# Patient Record
Sex: Male | Born: 1995 | Race: White | Hispanic: No | Marital: Single | Smoking: Never smoker
Health system: Southern US, Community
[De-identification: ages and names within clinical notes are randomized; demographics above are authoritative.]

## PROBLEM LIST (undated history)

## (undated) ENCOUNTER — Emergency Department: Admission: EM | Payer: Medicaid Other | Source: Home / Self Care

## (undated) DIAGNOSIS — F329 Major depressive disorder, single episode, unspecified: Secondary | ICD-10-CM

## (undated) DIAGNOSIS — F909 Attention-deficit hyperactivity disorder, unspecified type: Secondary | ICD-10-CM

## (undated) DIAGNOSIS — F32A Depression, unspecified: Secondary | ICD-10-CM

## (undated) DIAGNOSIS — F4481 Dissociative identity disorder: Secondary | ICD-10-CM

## (undated) DIAGNOSIS — F649 Gender identity disorder, unspecified: Secondary | ICD-10-CM

## (undated) DIAGNOSIS — F840 Autistic disorder: Secondary | ICD-10-CM

## (undated) DIAGNOSIS — F4310 Post-traumatic stress disorder, unspecified: Secondary | ICD-10-CM

## (undated) DIAGNOSIS — F419 Anxiety disorder, unspecified: Secondary | ICD-10-CM

## (undated) HISTORY — PX: FACIAL FRACTURE SURGERY: SHX1570

---
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
## 2016-01-12 ENCOUNTER — Emergency Department
Admission: EM | Admit: 2016-01-12 | Discharge: 2016-01-12 | Disposition: A | Discharge status: Home / Self Care | Payer: Medicaid Other | Attending: Emergency Medicine | Admitting: Emergency Medicine

## 2016-01-12 ENCOUNTER — Emergency Department: Payer: Medicaid Other

## 2016-01-12 ENCOUNTER — Encounter: Payer: Self-pay | Admitting: Emergency Medicine

## 2016-01-12 DIAGNOSIS — R0789 Other chest pain: Secondary | ICD-10-CM | POA: Diagnosis present

## 2016-01-12 DIAGNOSIS — M940 Chondrocostal junction syndrome [Tietze]: Secondary | ICD-10-CM

## 2016-01-12 MED ORDER — IBUPROFEN 800 MG PO TABS: 800.0000 mg | ORAL_TABLET | Freq: Three times a day (TID) | ORAL | Status: DC | PRN

## 2016-01-12 MED ORDER — BACLOFEN 10 MG PO TABS: 10.0000 mg | ORAL_TABLET | Freq: Three times a day (TID) | ORAL | Status: DC

## 2016-01-12 NOTE — ED Notes (Signed)
Describes pain across chest with movement for couple of days   denies any trauma   Fever or cough..staes pain is like a muscle type pain

## 2016-01-12 NOTE — Discharge Instructions (Signed)
Chest Wall Pain °Chest wall pain is pain in or around the bones and muscles of your chest. Sometimes, an injury causes this pain. Sometimes, the cause may not be known. This pain may take several weeks or longer to get better. °HOME CARE INSTRUCTIONS  °Pay attention to any changes in your symptoms. Take these actions to help with your pain:  °· Rest as told by your health care provider.   °· Avoid activities that cause pain. These include any activities that use your chest muscles or your abdominal and side muscles to lift heavy items.    °· If directed, apply ice to the painful area: °· Put ice in a plastic bag. °· Place a towel between your skin and the bag. °· Leave the ice on for 20 minutes, 2-3 times per day. °· Take over-the-counter and prescription medicines only as told by your health care provider. °· Do not use tobacco products, including cigarettes, chewing tobacco, and e-cigarettes. If you need help quitting, ask your health care provider. °· Keep all follow-up visits as told by your health care provider. This is important. °SEEK MEDICAL CARE IF: °· You have a fever. °· Your chest pain becomes worse. °· You have new symptoms. °SEEK IMMEDIATE MEDICAL CARE IF: °· You have nausea or vomiting. °· You feel sweaty or light-headed. °· You have a cough with phlegm (sputum) or you cough up blood. °· You develop shortness of breath. °  °This information is not intended to replace advice given to you by your health care provider. Make sure you discuss any questions you have with your health care provider. °  °Document Released: 08/09/2005 Document Revised: 04/30/2015 Document Reviewed: 11/04/2014 °Elsevier Interactive Patient Education ©2016 [employer] ° °Costochondritis °Costochondritis, sometimes called Tietze syndrome, is a swelling and irritation (inflammation) of the tissue (cartilage) that connects your ribs with your breastbone (sternum). It causes pain in the chest and rib area. Costochondritis usually  goes away on its own over time. It can take up to 6 weeks or longer to get better, especially if you are unable to limit your activities. °CAUSES  °Some cases of costochondritis have no known cause. Possible causes include: °· Injury (trauma). °· Exercise or activity such as lifting. °· Severe coughing. °SIGNS AND SYMPTOMS °· Pain and tenderness in the chest and rib area. °· Pain that gets worse when coughing or taking deep breaths. °· Pain that gets worse with specific movements. °DIAGNOSIS  °Your health care provider will do a physical exam and ask about your symptoms. Chest X-rays or other tests may be done to rule out other problems. °TREATMENT  °Costochondritis usually goes away on its own over time. Your health care provider may prescribe medicine to help relieve pain. °HOME CARE INSTRUCTIONS  °· Avoid exhausting physical activity. Try not to strain your ribs during normal activity. This would include any activities using chest, abdominal, and side muscles, especially if heavy weights are used. °· Apply ice to the affected area for the first 2 days after the pain begins. °¨ Put ice in a plastic bag. °¨ Place a towel between your skin and the bag. °¨ Leave the ice on for 20 minutes, 2-3 times a day. °· Only take over-the-counter or prescription medicines as directed by your health care provider. °SEEK MEDICAL CARE IF: °· You have redness or swelling at the rib joints. These are signs of infection. °· Your pain does not go away despite rest or medicine. °SEEK IMMEDIATE MEDICAL CARE IF:  °· Your pain   increases or you are very uncomfortable. °· You have shortness of breath or difficulty breathing. °· You cough up blood. °· You have worse chest pains, sweating, or vomiting. °· You have a fever or persistent symptoms for more than 2-3 days. °· You have a fever and your symptoms suddenly get worse. °MAKE SURE YOU:  °· Understand these instructions. °· Will watch your condition. °· Will get help right away if you are  not doing well or get worse. °  °This information is not intended to replace advice given to you by your health care provider. Make sure you discuss any questions you have with your health care provider. °  °Document Released: 05/19/2005 Document Revised: 05/30/2013 Document Reviewed: 03/13/2013 °Elsevier Interactive Patient Education ©2016 Elsevier Inc. ° °

## 2016-01-12 NOTE — ED Notes (Signed)
States he developed muscle type pain to mid chest   Non radiating   For the past 3-4 days

## 2016-01-12 NOTE — ED Notes (Signed)
Pt presents with chest discomfort describes as muscular in type, starting yesterday. Denies any other sx.

## 2016-01-12 NOTE — ED Provider Notes (Signed)
[facility] Emergency Department Provider Note  ____________________________________________  Time seen: Approximately 2:55 PM  I have reviewed the triage vital signs and the nursing notes.   HISTORY  Chief Complaint Muscle Pain    HPI George [name] is a 20 y.o. male presents for evaluation of nonspecific chest wall pain. States it's been going on for last 3-4 days worse yesterday and today. States that he feels like it's secondary to his weight but pain is worse with movement. Denies any shortness of breath. Denies any nausea vomiting or sweating.   History reviewed. No pertinent past medical history.  There are no active problems to display for this patient.   History reviewed. No pertinent past surgical history.  Current Outpatient Rx  Name  Route  Sig  Dispense  Refill  . baclofen (LIORESAL) 10 MG tablet   Oral   Take 1 tablet (10 mg total) by mouth 3 (three) times daily.   30 tablet   0   . ibuprofen (ADVIL,MOTRIN) 800 MG tablet   Oral   Take 1 tablet (800 mg total) by mouth every 8 (eight) hours as needed.   30 tablet   0     Allergies Lithium and Tegretol  No family history on file.  Social History Social History  Substance Use Topics  . Smoking status: Never Smoker   . Smokeless tobacco: None  . Alcohol Use: No    Review of Systems Constitutional: No fever/chills Eyes: No visual changes. ENT: No sore throat. Cardiovascular: Positive for chest wall pain. Respiratory: Denies shortness of breath. Gastrointestinal: No abdominal pain.  No nausea, no vomiting.  No diarrhea.  No constipation. Genitourinary: Negative for dysuria. Musculoskeletal: Negative for back pain. Skin: Negative for rash. Neurological: Negative for headaches, focal weakness or numbness.  10-point ROS otherwise negative.  ____________________________________________   PHYSICAL EXAM:  VITAL SIGNS: ED Triage Vitals  Enc Vitals Group     BP  01/12/16 1342 107/74 mmHg     Pulse Rate 01/12/16 1342 72     Resp 01/12/16 1342 20     Temp 01/12/16 1342 98.2 F (36.8 C)     Temp Source 01/12/16 1342 Oral     SpO2 01/12/16 1342 98 %     Weight 01/12/16 1342 112 lb (50.803 kg)     Height 01/12/16 1342  (1.702 m)     Head Cir --      Peak Flow --      Pain Score 01/12/16 1337 5     Pain Loc --      Pain Edu? --      Excl. in GC? --     Constitutional: Alert and oriented. Well appearing and in no acute distress. Head: Atraumatic. Neck: No stridor.Full range of motion nontender   Cardiovascular: Normal rate, regular rhythm. Grossly normal heart sounds.  Good peripheral circulation. Point tenderness noted to the sternal area. Respiratory: Normal respiratory effort.  No retractions. Lungs CTAB. Musculoskeletal: No lower extremity tenderness nor edema.  No joint effusions. Neurologic:  Normal speech and language. No gross focal neurologic deficits are appreciated. No gait instability. Skin:  Skin is warm, dry and intact. No rash noted. Psychiatric: Mood and affect are normal. Speech and behavior are normal.  ____________________________________________   LABS (all labs ordered are listed, but only abnormal results are displayed)  Labs Reviewed - No data to display ____________________________________________  EKG  Normal sinus rhythm. No acute STEMI. ____________________________________________  RADIOLOGY  No acute cardiopulmonary  findings. ____________________________________________   PROCEDURES  Procedure(s) performed: None  Critical Care performed: No  ____________________________________________   INITIAL IMPRESSION / ASSESSMENT AND PLAN / ED COURSE  Pertinent labs & imaging results that were available during my care of the patient were reviewed by me and considered in my medical decision making (see chart for details).  Nonspecific chest wall pain. Rx given for Motrin 800 mg 3 times a day and  baclofen 10 mg 3 times a day. She'll follow up with PCP or return to ER with any worsening symptomology. ____________________________________________   FINAL CLINICAL IMPRESSION(S) / ED DIAGNOSES  Final diagnoses:  Costochondritis, acute     This chart was dictated using voice recognition software/Dragon. Despite best efforts to proofread, errors can occur which can change the meaning. Any change was purely unintentional.   Evangeline Dakinharles M Keylani Perlstein, PA-C 01/12/16 1529  Governor Rooksebecca Lord, MD 01/12/16 1606

## 2016-06-16 ENCOUNTER — Emergency Department
Admission: EM | Admit: 2016-06-16 | Discharge: 2016-06-16 | Disposition: A | Discharge status: Home / Self Care | Payer: Medicaid Other | Attending: Emergency Medicine | Admitting: Emergency Medicine

## 2016-06-16 ENCOUNTER — Encounter: Payer: Self-pay | Admitting: Emergency Medicine

## 2016-06-16 DIAGNOSIS — J309 Allergic rhinitis, unspecified: Secondary | ICD-10-CM | POA: Diagnosis not present

## 2016-06-16 DIAGNOSIS — H04301 Unspecified dacryocystitis of right lacrimal passage: Secondary | ICD-10-CM | POA: Diagnosis not present

## 2016-06-16 DIAGNOSIS — H1011 Acute atopic conjunctivitis, right eye: Secondary | ICD-10-CM | POA: Diagnosis not present

## 2016-06-16 DIAGNOSIS — Z791 Long term (current) use of non-steroidal anti-inflammatories (NSAID): Secondary | ICD-10-CM | POA: Diagnosis not present

## 2016-06-16 DIAGNOSIS — H5711 Ocular pain, right eye: Secondary | ICD-10-CM | POA: Diagnosis present

## 2016-06-16 MED ORDER — POLYMYXIN B-TRIMETHOPRIM 10000-0.1 UNIT/ML-% OP SOLN: 2.0000 [drp] | Freq: Four times a day (QID) | OPHTHALMIC | 0 refills | Status: DC

## 2016-06-16 MED ORDER — CETIRIZINE HCL 10 MG PO TABS: 10.0000 mg | ORAL_TABLET | Freq: Every day | ORAL | 0 refills | Status: DC

## 2016-06-16 MED ORDER — CROMOLYN SODIUM 4 % OP SOLN: 2.0000 [drp] | Freq: Four times a day (QID) | OPHTHALMIC | 12 refills | Status: DC

## 2016-06-16 MED ORDER — FLUTICASONE PROPIONATE 50 MCG/ACT NA SUSP: 1.0000 | Freq: Two times a day (BID) | NASAL | 0 refills | Status: DC

## 2016-06-16 NOTE — ED Provider Notes (Signed)
[facility] Emergency Department Provider Note  ____________________________________________  Time seen: Approximately 8:15 PM  I have reviewed the triage vital signs and the nursing notes.   HISTORY  Chief Complaint Eye Drainage    HPI George [name] is a 20 y.o. male who presents emergency department complaining of right eye pain/drainage and redness. Patient was seen by primary care and diagnosed most with eye irritation. He is advised to use warm compresses but was not given any medications. Patient states that he has continued to have symptoms for total of 4 weeks at this point. He denies any visual changes. He does not wear glasses or contacts. No trauma to the eye preceding these symptoms. Patient states that he has pain to the corner of the eye over the tear duct. He states that there is a watery as well as mildly purulent drainage that occurs throughout the day. He denies any conjunctival redness that is chronic but states that occasionally his eyes will become red. It is contained to the right eye only. He does have allergic rhinitis type symptoms. No other complaints at this time.   History reviewed. No pertinent past medical history.  There are no active problems to display for this patient.   History reviewed. No pertinent surgical history.  Prior to Admission medications   Medication Sig Start Date End Date Taking? Authorizing Provider  baclofen (LIORESAL) 10 MG tablet Take 1 tablet (10 mg total) by mouth 3 (three) times daily. 01/12/16   Evangeline Dakin, PA-C  cetirizine (ZYRTEC) 10 MG tablet Take 1 tablet (10 mg total) by mouth daily. 06/16/16   Delorise Royals Cuthriell, PA-C  cromolyn (OPTICROM) 4 % ophthalmic solution Place 2 drops into the right eye 4 (four) times daily. 06/16/16   Delorise Royals Cuthriell, PA-C  fluticasone (FLONASE) 50 MCG/ACT nasal spray Place 1 spray into both nostrils 2 (two) times daily. 06/16/16   Delorise Royals Cuthriell, PA-C   ibuprofen (ADVIL,MOTRIN) 800 MG tablet Take 1 tablet (800 mg total) by mouth every 8 (eight) hours as needed. 01/12/16   Evangeline Dakin, PA-C  trimethoprim-polymyxin b (POLYTRIM) ophthalmic solution Place 2 drops into the left eye every 6 (six) hours. 06/16/16   Delorise Royals Cuthriell, PA-C    Allergies Lithium and Tegretol [carbamazepine]  No family history on file.  Social History Social History  Substance Use Topics  . Smoking status: Never Smoker  . Smokeless tobacco: Never Used  . Alcohol use No     Review of Systems  Constitutional: No fever/chills Eyes: No visual changes. Positive for both watery and purulent discharge. Mild eye redness. Mild pain. ENT: No upper respiratory complaints. Cardiovascular: no chest pain. Respiratory: no cough. No SOB. Musculoskeletal: Negative for musculoskeletal pain. Skin: Negative for rash, abrasions, lacerations, ecchymosis. Neurological: Negative for headaches, focal weakness or numbness. 10-point ROS otherwise negative.  ____________________________________________   PHYSICAL EXAM:  VITAL SIGNS: ED Triage Vitals  Enc Vitals Group     BP 06/16/16 1948 110/73     Pulse Rate 06/16/16 1948 85     Resp 06/16/16 1948 18     Temp 06/16/16 1948 98.1 F (36.7 C)     Temp Source 06/16/16 1948 Oral     SpO2 06/16/16 1948 100 %     Weight 06/16/16 1948 100 lb (45.4 kg)     Height 06/16/16 1948 5\' 7"  (1.702 m)     Head Circumference --      Peak Flow --  Pain Score 06/16/16 1949 5     Pain Loc --      Pain Edu? --      Excl. in GC? --      Constitutional: Alert and oriented. Well appearing and in no acute distress. Eyes: Conjunctivae Is slightly erythematous on right. Patient does have some mild erythema over the lacrimal gland. Area is very mildly tender to palpation. With palpation, mild purulent drainage is appreciated. No lacrimal stones are appreciated. Funduscopic exam is unremarkable bilaterally. PERRL. EOMI. Head:  Atraumatic. ENT:      Ears:       Nose: No congestion/rhinnorhea. Turbinates are mildly boggy in appearance.      Mouth/Throat: Mucous membranes are moist.  Neck: No stridor.    Cardiovascular: Normal rate, regular rhythm. Normal S1 and S2.  Good peripheral circulation. Respiratory: Normal respiratory effort without tachypnea or retractions. Lungs CTAB. Good air entry to the bases with no decreased or absent breath sounds. Musculoskeletal: Full range of motion to all extremities. No gross deformities appreciated. Neurologic:  Normal speech and language. No gross focal neurologic deficits are appreciated.  Skin:  Skin is warm, dry and intact. No rash noted. Psychiatric: Mood and affect are normal. Speech and behavior are normal. Patient exhibits appropriate insight and judgement.   ____________________________________________   LABS (all labs ordered are listed, but only abnormal results are displayed)  Labs Reviewed - No data to display ____________________________________________  EKG   ____________________________________________  RADIOLOGY   No results found.  ____________________________________________    PROCEDURES  Procedure(s) performed:    Procedures    Medications - No data to display   ____________________________________________   INITIAL IMPRESSION / ASSESSMENT AND PLAN / ED COURSE  Pertinent labs & imaging results that were available during my care of the patient were reviewed by me and considered in my medical decision making (see chart for details).  Review of the  [place]RS was performed in accordance of the [facility] prior to dispensing any controlled drugs.  Clinical Course    Patient's diagnosis is consistent with Mild dacryocystitis versus allergic conjunctivitis. Patient also has allergic rhinitis type symptoms. Because of the ongoing length and physical exam findings of mild tenderness to palpation over the lacrimal gland, patient will be  placed on antibiotic eyedrops as well as cromolyn eyedrops for symptom control. If symptoms persist patient will follow-up with ophthalmology. No acute indications of severe bacterial infection requiring ophthalmology consult at this time. Patient is also given medications for allergic rhinitis type symptoms..  Patient is given ED precautions to return to the ED for any worsening or new symptoms.     ____________________________________________  FINAL CLINICAL IMPRESSION(S) / ED DIAGNOSES  Final diagnoses:  Dacrocystitis, right  Allergic conjunctivitis of right eye  Allergic rhinitis, unspecified chronicity, unspecified seasonality, unspecified trigger      NEW MEDICATIONS STARTED DURING THIS VISIT:  New Prescriptions   CETIRIZINE (ZYRTEC) 10 MG TABLET    Take 1 tablet (10 mg total) by mouth daily.   CROMOLYN (OPTICROM) 4 % OPHTHALMIC SOLUTION    Place 2 drops into the right eye 4 (four) times daily.   FLUTICASONE (FLONASE) 50 MCG/ACT NASAL SPRAY    Place 1 spray into both nostrils 2 (two) times daily.   TRIMETHOPRIM-POLYMYXIN B (POLYTRIM) OPHTHALMIC SOLUTION    Place 2 drops into the left eye every 6 (six) hours.        This chart was dictated using voice recognition software/Dragon. Despite best efforts to proofread, errors can  occur which can change the meaning. Any change was purely unintentional.    Racheal PatchesJonathan D Cuthriell, PA-C 06/16/16 2032    Rockne MenghiniAnne-Caroline Norman, MD 06/16/16 2300

## 2016-06-16 NOTE — ED Triage Notes (Signed)
Pt presents to ED with c/o right eye drainage x 4 weeks, pt reports went 2 weeks ago to Urgent Care and was told to place warm water to eye to help with drainage. Pt states eye drainage has been getting worse.

## 2017-06-21 IMAGING — CR DG CHEST 2V
1 series · 2 of 2 positions shown · non-contrast
Comparison: 08/02/2013

CLINICAL DATA: Mid chest pain for 3 days

EXAM:
CHEST  2 VIEW

[Series 1: dg chest 2 view · 0.14mm/px · 2 of 2 slices shown]
[im 1/2]
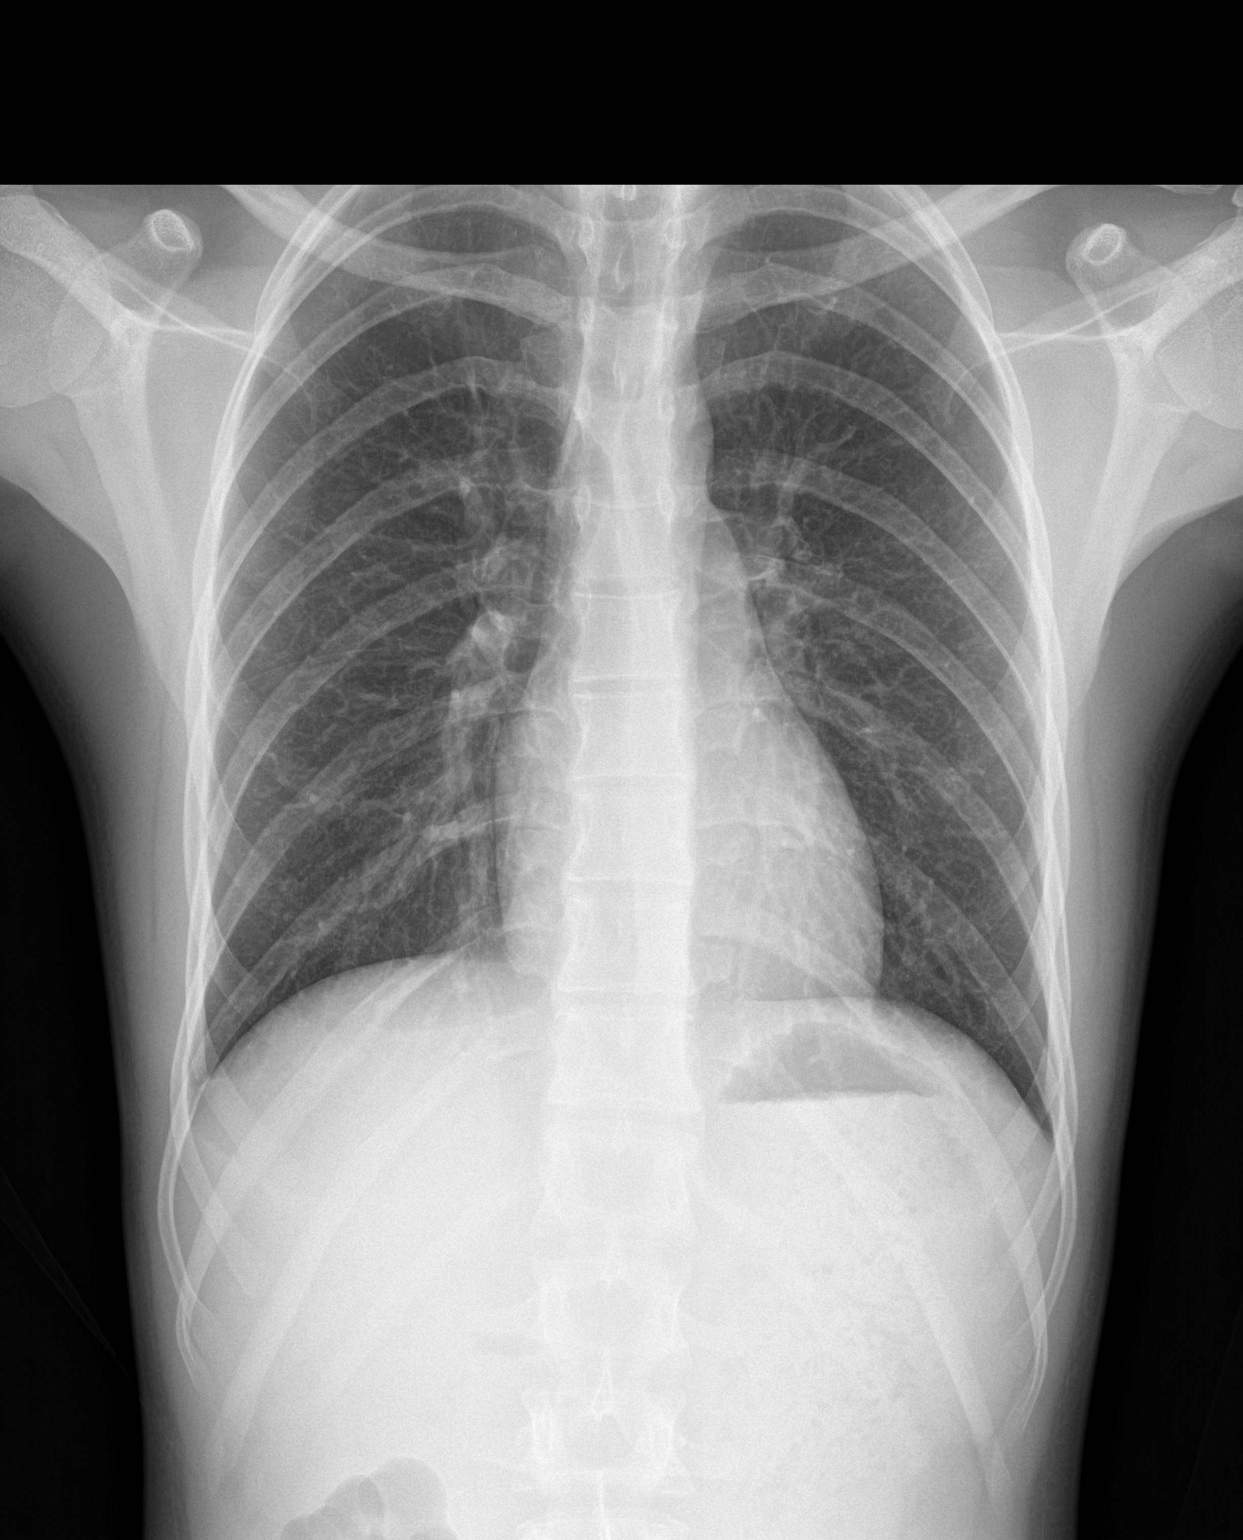
[im 2/2]
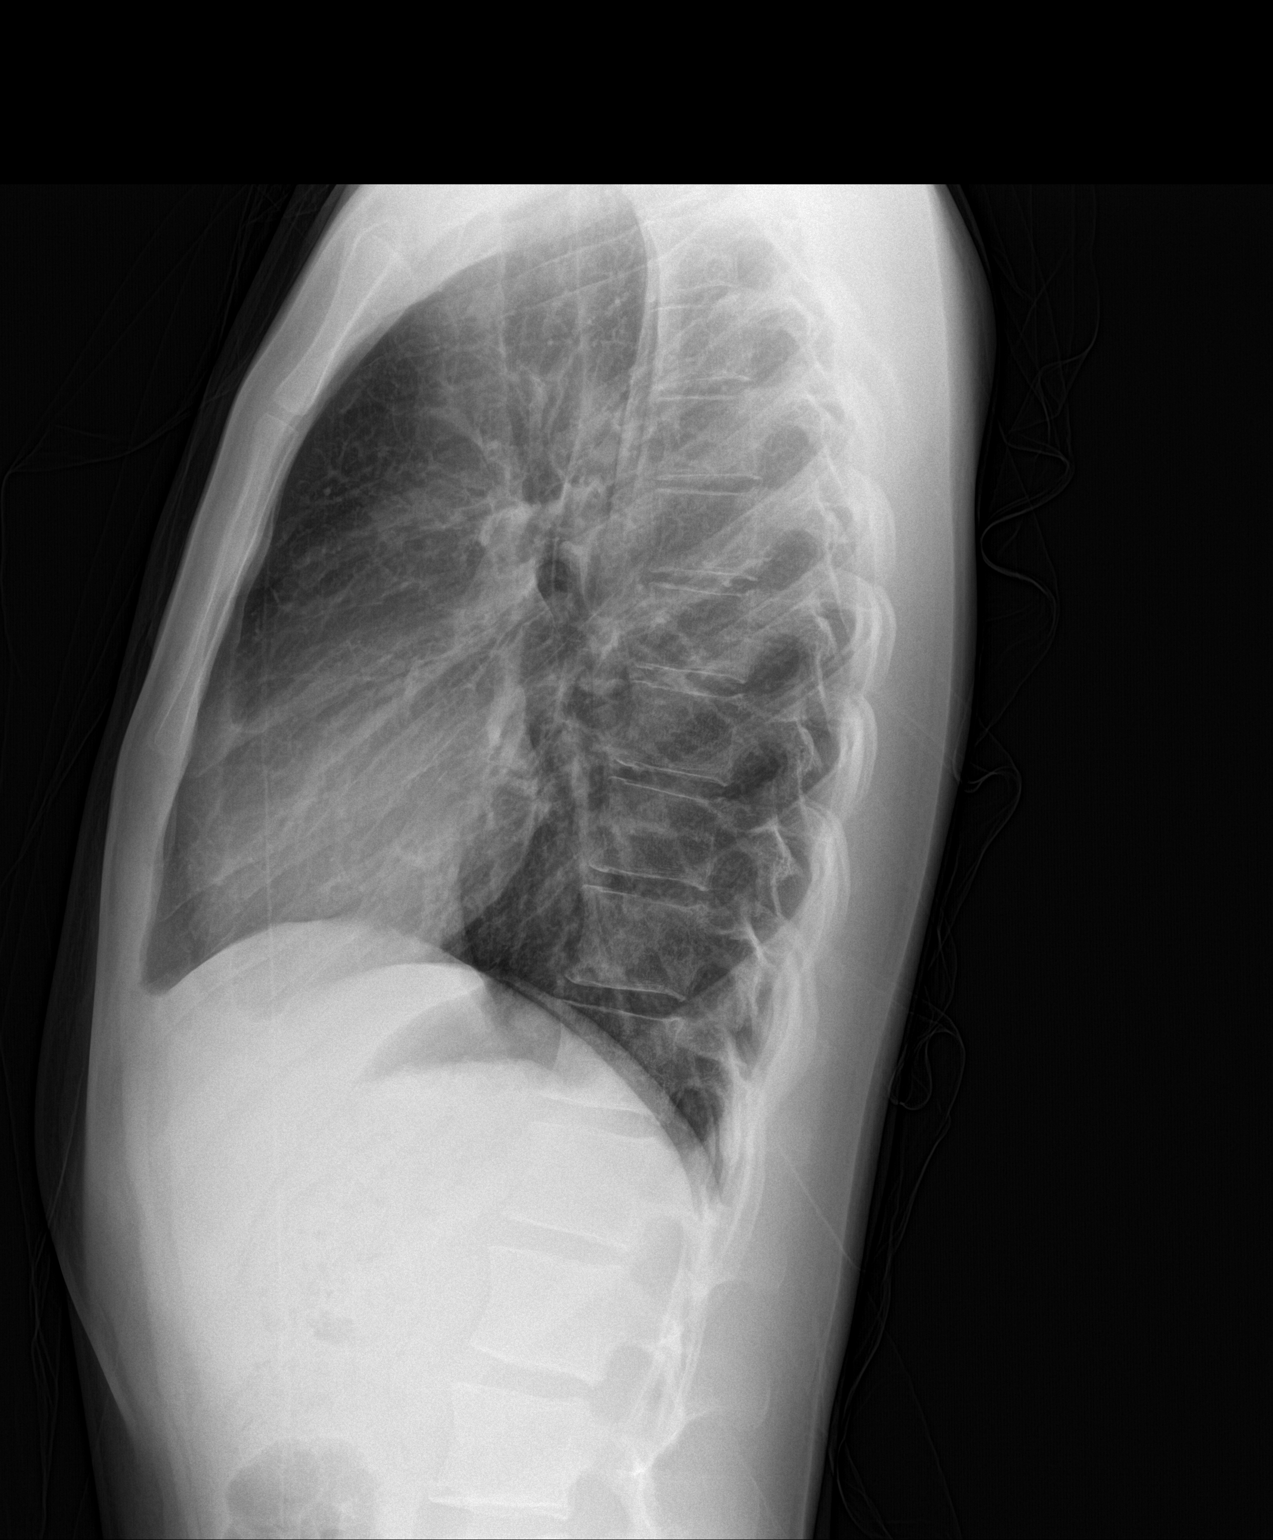

[2 of 2 positions shown; findings below may reference images not displayed]

FINDINGS: The heart size and mediastinal contours are within normal limits.
Both lungs are clear. The visualized skeletal structures are
unremarkable.
IMPRESSION: No active cardiopulmonary disease.

## 2018-01-09 ENCOUNTER — Ambulatory Visit: Payer: Self-pay | Admitting: Pharmacy Technician

## 2018-01-09 ENCOUNTER — Encounter (INDEPENDENT_AMBULATORY_CARE_PROVIDER_SITE_OTHER): Payer: Self-pay

## 2018-01-09 DIAGNOSIS — Z79899 Other long term (current) drug therapy: Secondary | ICD-10-CM

## 2018-01-09 NOTE — Progress Notes (Signed)
  Completed [facility] application and contract.  Patient agreed to all terms of the [facility] contract.    Patient approved to receive medication assistance at [facility] through 2019, as long as eligibility criteria continues to be met.    Provided patient with Civil engineer, contracting based on his particular needs.    Referred patient to [facility].  Refused. Patient stated that he is seeing Kalman Drape at [facility].   Referred patient to [place] [facility] [employer].  Patient stated that he cannot be seen at [facility] because of a conflict of interest.    [facility]

## 2018-01-10 ENCOUNTER — Encounter: Payer: Self-pay | Admitting: Pharmacy Technician

## 2018-05-31 ENCOUNTER — Emergency Department: Payer: Medicaid Other

## 2018-05-31 DIAGNOSIS — Y9389 Activity, other specified: Secondary | ICD-10-CM | POA: Insufficient documentation

## 2018-05-31 DIAGNOSIS — Y92410 Unspecified street and highway as the place of occurrence of the external cause: Secondary | ICD-10-CM | POA: Insufficient documentation

## 2018-05-31 DIAGNOSIS — S9032XA Contusion of left foot, initial encounter: Secondary | ICD-10-CM | POA: Insufficient documentation

## 2018-05-31 DIAGNOSIS — Y999 Unspecified external cause status: Secondary | ICD-10-CM | POA: Insufficient documentation

## 2018-05-31 DIAGNOSIS — Z79899 Other long term (current) drug therapy: Secondary | ICD-10-CM | POA: Insufficient documentation
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
## 2018-06-01 ENCOUNTER — Emergency Department
Admission: EM | Admit: 2018-06-01 | Discharge: 2018-06-01 | Disposition: A | Discharge status: Home / Self Care | Payer: Medicaid Other | Attending: Emergency Medicine | Admitting: Emergency Medicine

## 2018-06-01 DIAGNOSIS — S9032XA Contusion of left foot, initial encounter: Secondary | ICD-10-CM

## 2018-06-01 HISTORY — DX: Anxiety disorder, unspecified: F41.9

## 2018-06-01 HISTORY — DX: Depression, unspecified: F32.A

## 2018-06-01 HISTORY — DX: Major depressive disorder, single episode, unspecified: F32.9

## 2018-06-01 HISTORY — DX: Attention-deficit hyperactivity disorder, unspecified type: F90.9

## 2018-06-01 MED ORDER — KETOROLAC TROMETHAMINE 10 MG PO TABS
10.0000 mg | ORAL_TABLET | Freq: Once | ORAL | Status: AC
  Administered 2018-06-01: 10 mg via ORAL
  Filled 2018-06-01: qty 1
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
## 2018-06-01 NOTE — ED Notes (Signed)
Pt voices good understanding and demonstrates appropriate use of crutches

## 2018-11-15 ENCOUNTER — Other Ambulatory Visit: Payer: Self-pay

## 2018-11-15 ENCOUNTER — Emergency Department
Admission: EM | Admit: 2018-11-15 | Discharge: 2018-11-15 | Disposition: A | Discharge status: Home / Self Care | Payer: Medicaid Other | Attending: Emergency Medicine | Admitting: Emergency Medicine

## 2018-11-15 DIAGNOSIS — R55 Syncope and collapse: Secondary | ICD-10-CM | POA: Insufficient documentation

## 2018-11-15 DIAGNOSIS — Z79899 Other long term (current) drug therapy: Secondary | ICD-10-CM | POA: Insufficient documentation

## 2018-11-15 LAB — CBC WITH DIFFERENTIAL/PLATELET
Abs Immature Granulocytes: 0.07 10*3/uL (ref 0.00–0.07)
Basophils Absolute: 0 10*3/uL (ref 0.0–0.1)
Basophils Relative: 0 %
EOS ABS: 0 10*3/uL (ref 0.0–0.5)
EOS PCT: 0 %
HEMATOCRIT: 40 % (ref 39.0–52.0)
HEMOGLOBIN: 14.5 g/dL (ref 13.0–17.0)
Immature Granulocytes: 0 %
Lymphocytes Relative: 8 %
Lymphs Abs: 1.4 10*3/uL (ref 0.7–4.0)
MCH: 30.9 pg (ref 26.0–34.0)
MCHC: 36.3 g/dL — AB (ref 30.0–36.0)
MCV: 85.1 fL (ref 80.0–100.0)
MONO ABS: 0.9 10*3/uL (ref 0.1–1.0)
Monocytes Relative: 5 %
Neutro Abs: 16.1 10*3/uL — ABNORMAL HIGH (ref 1.7–7.7)
Neutrophils Relative %: 87 %
Platelets: 298 10*3/uL (ref 150–400)
RBC: 4.7 MIL/uL (ref 4.22–5.81)
RDW: 12 % (ref 11.5–15.5)
WBC: 18.5 10*3/uL — AB (ref 4.0–10.5)
nRBC: 0 % (ref 0.0–0.2)

## 2018-11-15 LAB — GLUCOSE, CAPILLARY: GLUCOSE-CAPILLARY: 130 mg/dL — AB (ref 70–99)

## 2018-11-15 LAB — BASIC METABOLIC PANEL
Anion gap: 11 (ref 5–15)
BUN: 16 mg/dL (ref 6–20)
CALCIUM: 9.8 mg/dL (ref 8.9–10.3)
CHLORIDE: 96 mmol/L — AB (ref 98–111)
CO2: 27 mmol/L (ref 22–32)
CREATININE: 0.67 mg/dL (ref 0.61–1.24)
GFR calc non Af Amer: >60 mL/min (ref >60)
Glucose, Bld: 154 mg/dL — ABNORMAL HIGH (ref 70–99)
Potassium: 4.3 mmol/L (ref 3.5–5.1)
SODIUM: 134 mmol/L — AB (ref 135–145)

## 2018-11-15 MED ORDER — SODIUM CHLORIDE 0.9 % IV BOLUS
1000.0000 mL | Freq: Once | INTRAVENOUS | Status: AC
  Administered 2018-11-15: 1000 mL via INTRAVENOUS

## 2018-11-15 NOTE — ED Notes (Signed)
Pt assisted to toilet 

## 2018-11-15 NOTE — ED Notes (Signed)
Pt stated they did not feel dizzy when they got up to use the bathroom- Dr Marisa Severin notified

## 2018-11-15 NOTE — ED Notes (Signed)
Pt was giving blood- states when needle was removed and saw a drop of blood and "passed out"- pt aroused easily with ammonia inhalant- at this time states that he is thirsty and hungry- per Dr Mayford Knife pt given peanut butter, crackers, and coke- no labs at this time

## 2018-11-15 NOTE — ED Triage Notes (Signed)
Pt gave blood and passed out- states this has never happened before after giving blood

## 2018-11-15 NOTE — ED Provider Notes (Signed)
[facility] Emergency Department Provider Note ____________________________________________   First MD Initiated Contact with Patient 11/15/18 1653     (approximate)  I have reviewed the triage vital signs and the nursing notes.   HISTORY  Chief Complaint Loss of Consciousness    HPI George [name] is a 23 y.o. adult male to male transgender patient who presents with syncope after donating blood, preceded by lightheadedness, and now resolved.  The patient states that after giving blood they began to feel lightheaded and nauseous.  They ate some crackers, but then continued to feel lightheaded and passed out.  They deny chest pain, shortness of breath, or any fever or recent illness.  They are feeling somewhat lightheaded and nauseated now but otherwise relatively well.  The patient states that they gave blood in the past without this happening.  Past Medical History:  Diagnosis Date  . ADHD   . Anxiety   . Depression     There are no active problems to display for this patient.   Past Surgical History:  Procedure Laterality Date  . FACIAL FRACTURE SURGERY      Prior to Admission medications   Medication Sig Start Date End Date Taking? Authorizing Provider  baclofen (LIORESAL) 10 MG tablet Take 1 tablet (10 mg total) by mouth 3 (three) times daily. 01/12/16   Beers, Charmayne Sheer, PA-C  cetirizine (ZYRTEC) 10 MG tablet Take 1 tablet (10 mg total) by mouth daily. 06/16/16   Cuthriell, Delorise Royals, PA-C  cromolyn (OPTICROM) 4 % ophthalmic solution Place 2 drops into the right eye 4 (four) times daily. 06/16/16   Cuthriell, Delorise Royals, PA-C  fluticasone (FLONASE) 50 MCG/ACT nasal spray Place 1 spray into both nostrils 2 (two) times daily. 06/16/16   Cuthriell, Delorise Royals, PA-C  ibuprofen (ADVIL,MOTRIN) 800 MG tablet Take 1 tablet (800 mg total) by mouth every 8 (eight) hours as needed. 01/12/16   Beers, Charmayne Sheer, PA-C  trimethoprim-polymyxin b (POLYTRIM)  ophthalmic solution Place 2 drops into the left eye every 6 (six) hours. 06/16/16   Cuthriell, Delorise Royals, PA-C    Allergies Lithium and Tegretol [carbamazepine]  History reviewed. No pertinent family history.  Social History Social History   Tobacco Use  . Smoking status: Never Smoker  . Smokeless tobacco: Never Used  Substance Use Topics  . Alcohol use: No  . Drug use: Not on file    Review of Systems  Constitutional: No fever. Eyes: No redness. ENT: No sore throat. Cardiovascular: Denies chest pain. Respiratory: Denies shortness of breath. Gastrointestinal: Positive for nausea Genitourinary: Negative for flank pain. Musculoskeletal: Negative for back pain. Skin: Negative for rash. Neurological: Negative for headache.   ____________________________________________   PHYSICAL EXAM:  VITAL SIGNS: ED Triage Vitals  Enc Vitals Group     BP 11/15/18 1446 (!) 87/74     Pulse Rate 11/15/18 1446 (!) 113     Resp 11/15/18 1452 17     Temp 11/15/18 1446 98.1 F (36.7 C)     Temp Source 11/15/18 1446 Oral     SpO2 11/15/18 1446 100 %     Weight 11/15/18 1447 110 lb (49.9 kg)     Height 11/15/18 1447 5\' 8"  (1.727 m)     Head Circumference --      Peak Flow --      Pain Score 11/15/18 1447 0     Pain Loc --      Pain Edu? --      Excl.  in GC? --     Constitutional: Alert and oriented.  Relatively well appearing and in no acute distress. Eyes: Conjunctivae are normal.  EOMI.  PERRLA. Head: Atraumatic. Nose: No congestion/rhinnorhea. Mouth/Throat: Mucous membranes are slightly dry.   Neck: Normal range of motion.  Cardiovascular: Normal rate, regular rhythm. Good peripheral circulation. Respiratory: Normal respiratory effort.  No retractions. Gastrointestinal: Soft and nontender. No distention.  Genitourinary: No flank tenderness. Musculoskeletal: Extremities warm and well perfused.  Neurologic:  Normal speech and language. No gross focal neurologic deficits  are appreciated.  Skin:  Skin is warm and dry. No rash noted. Psychiatric: Mood and affect are normal. Speech and behavior are normal.  ____________________________________________   LABS (all labs ordered are listed, but only abnormal results are displayed)  Labs Reviewed  GLUCOSE, CAPILLARY - Abnormal; Notable for the following components:      Result Value   Glucose-Capillary 130 (*)    All other components within normal limits  BASIC METABOLIC PANEL - Abnormal; Notable for the following components:   Sodium 134 (*)    Chloride 96 (*)    Glucose, Bld 154 (*)    All other components within normal limits  CBC WITH DIFFERENTIAL/PLATELET - Abnormal; Notable for the following components:   WBC 18.5 (*)    MCHC 36.3 (*)    Neutro Abs 16.1 (*)    All other components within normal limits  CBG MONITORING, ED   ____________________________________________  EKG  ED ECG REPORT I, Dionne Bucy, the attending physician, personally viewed and interpreted this ECG.  Date: 11/15/2018 EKG Time: 14:48 Rate: 120 Rhythm: Sinus tachycardia QRS Axis: normal Intervals: normal ST/T Wave abnormalities: Benign early repolarization Narrative Interpretation: no evidence of acute ischemia  ____________________________________________  RADIOLOGY    ____________________________________________   PROCEDURES  Procedure(s) performed: No  Procedures  Critical Care performed: No ____________________________________________   INITIAL IMPRESSION / ASSESSMENT AND PLAN / ED COURSE  Pertinent labs & imaging results that were available during my care of the patient were reviewed by me and considered in my medical decision making (see chart for details).  23 year old male to male transgender patient currently on hormonal therapy presents with syncope directly after donating blood, preceded by a prodrome of lightheadedness and nausea.  On exam, the patient is well-appearing and the  vital signs are normal except for borderline tachycardia.  The remainder of the exam is unremarkable.  EKG shows no significant abnormalities.  Overall I suspect most likely vasovagal syncope related to needle phobia, versus hypovolemia related to the blood donation.  Given the context of the episode and the presence of a prodrome, there is no evidence of cardiac etiology.  We will give fluids, obtain basic labs, and reassess.  ----------------------------------------- 7:02 PM on 11/15/2018 -----------------------------------------  Lab workup is unremarkable, except for elevated WBC count which could be related to the patient's hormone therapy or acute stress.  The patient is feeling better after fluids.  They are stable for discharge home. Return precautions given and the patient expresses understanding.   ____________________________________________   FINAL CLINICAL IMPRESSION(S) / ED DIAGNOSES  Final diagnoses:  Syncope, unspecified syncope type      NEW MEDICATIONS STARTED DURING THIS VISIT:  New Prescriptions   No medications on file     Note:  This document was prepared using Dragon voice recognition software and may include unintentional dictation errors.   Dionne Bucy, MD 11/15/18 1904

## 2018-11-15 NOTE — Discharge Instructions (Addendum)
Return to the ER for new, worsening or persistent lightheadedness, weakness, nausea, abdominal pain or any other new or worsening symptoms that concern you.

## 2019-01-28 ENCOUNTER — Encounter: Payer: Self-pay | Admitting: Intensive Care

## 2019-01-28 ENCOUNTER — Emergency Department
Admission: EM | Admit: 2019-01-28 | Discharge: 2019-01-30 | Disposition: A | Discharge status: Home / Self Care | Payer: Medicaid Other | Attending: Emergency Medicine | Admitting: Emergency Medicine

## 2019-01-28 ENCOUNTER — Other Ambulatory Visit: Payer: Self-pay

## 2019-01-28 DIAGNOSIS — F909 Attention-deficit hyperactivity disorder, unspecified type: Secondary | ICD-10-CM

## 2019-01-28 DIAGNOSIS — R45851 Suicidal ideations: Secondary | ICD-10-CM

## 2019-01-28 DIAGNOSIS — Z1159 Encounter for screening for other viral diseases: Secondary | ICD-10-CM | POA: Insufficient documentation

## 2019-01-28 DIAGNOSIS — F329 Major depressive disorder, single episode, unspecified: Secondary | ICD-10-CM | POA: Insufficient documentation

## 2019-01-28 DIAGNOSIS — F419 Anxiety disorder, unspecified: Secondary | ICD-10-CM | POA: Diagnosis present

## 2019-01-28 DIAGNOSIS — F4310 Post-traumatic stress disorder, unspecified: Secondary | ICD-10-CM

## 2019-01-28 DIAGNOSIS — F32A Depression, unspecified: Secondary | ICD-10-CM | POA: Diagnosis present

## 2019-01-28 DIAGNOSIS — Z79890 Hormone replacement therapy (postmenopausal): Secondary | ICD-10-CM

## 2019-01-28 HISTORY — DX: Gender identity disorder, unspecified: F64.9

## 2019-01-28 HISTORY — DX: Post-traumatic stress disorder, unspecified: F43.10

## 2019-01-28 LAB — COMPREHENSIVE METABOLIC PANEL
ALT: 12 U/L (ref 0–44)
AST: 17 U/L (ref 15–41)
Albumin: 5.3 g/dL — ABNORMAL HIGH (ref 3.5–5.0)
Alkaline Phosphatase: 53 U/L (ref 38–126)
Anion gap: 9 (ref 5–15)
BUN: 16 mg/dL (ref 6–20)
CO2: 26 mmol/L (ref 22–32)
Calcium: 9.4 mg/dL (ref 8.9–10.3)
Chloride: 99 mmol/L (ref 98–111)
Creatinine, Ser: 0.61 mg/dL (ref 0.61–1.24)
GFR calc Af Amer: >60 mL/min (ref >60)
GFR calc non Af Amer: >60 mL/min (ref >60)
Glucose, Bld: 111 mg/dL — ABNORMAL HIGH (ref 70–99)
Potassium: 4 mmol/L (ref 3.5–5.1)
Sodium: 134 mmol/L — ABNORMAL LOW (ref 135–145)
Total Bilirubin: 0.6 mg/dL (ref 0.3–1.2)
Total Protein: 8.3 g/dL — ABNORMAL HIGH (ref 6.5–8.1)

## 2019-01-28 LAB — URINE DRUG SCREEN, QUALITATIVE (ARMC ONLY)
Amphetamines, Ur Screen: NOT DETECTED
Barbiturates, Ur Screen: NOT DETECTED
Benzodiazepine, Ur Scrn: NOT DETECTED
Cannabinoid 50 Ng, Ur ~~LOC~~: NOT DETECTED
Cocaine Metabolite,Ur ~~LOC~~: NOT DETECTED
MDMA (Ecstasy)Ur Screen: NOT DETECTED
Methadone Scn, Ur: NOT DETECTED
Opiate, Ur Screen: NOT DETECTED
Phencyclidine (PCP) Ur S: NOT DETECTED
Tricyclic, Ur Screen: NOT DETECTED

## 2019-01-28 LAB — CBC
HCT: 44.1 % (ref 39.0–52.0)
Hemoglobin: 15.6 g/dL (ref 13.0–17.0)
MCH: 29.8 pg (ref 26.0–34.0)
MCHC: 35.4 g/dL (ref 30.0–36.0)
MCV: 84.3 fL (ref 80.0–100.0)
Platelets: 328 10*3/uL (ref 150–400)
RBC: 5.23 MIL/uL (ref 4.22–5.81)
RDW: 11.9 % (ref 11.5–15.5)
WBC: 10.1 10*3/uL (ref 4.0–10.5)
nRBC: 0 % (ref 0.0–0.2)

## 2019-01-28 LAB — ACETAMINOPHEN LEVEL: Acetaminophen (Tylenol), Serum: <10 ug/mL — ABNORMAL LOW (ref 10–30)

## 2019-01-28 LAB — ETHANOL: Alcohol, Ethyl (B): <10 mg/dL (ref <10)

## 2019-01-28 LAB — SALICYLATE LEVEL: Salicylate Lvl: <7 mg/dL (ref 2.8–30.0)

## 2019-01-28 NOTE — ED Notes (Signed)
Hourly rounding reveals patient sleeping in room. No complaints, stable, in no acute distress. Q15 minute rounds and monitoring via Security Cameras to continue. 

## 2019-01-28 NOTE — ED Notes (Signed)
Patient assigned to appropriate care area. Patient oriented to unit/care area: Informed that, for their safety, care areas are designed for safety and monitored by security cameras at all times; and visiting hours explained to patient. Patient verbalizes understanding, and verbal contract for safety obtained. 

## 2019-01-28 NOTE — ED Provider Notes (Signed)
[facility] Emergency Department Provider Note   ____________________________________________   First MD Initiated Contact with Patient 01/28/19 1722     (approximate)  I have reviewed the triage vital signs and the nursing notes.   HISTORY  Chief Complaint Suicidal \  HPI George [name] is a 23 y.o. adult who is here because he is depressed and thinking of taking a knife and cutting himself.  He wants to bleed out.  He says he has been depressed since he was a child.  His medications have not been able to help him much.  He is very scared nicer for him to think of using a knife to kill himself is really bad.  He is also very anxious.  He says he is a trans person.  President Trump makes him very anxious because of the fact that the patient thinks Trump is racist and sexist and massage and is an anti-trans-.  This all makes him very anxious.   Patient also says that lithium helped him but also made him sick and he cannot take it and lithium also powers the batteries and cell phones and watches etc. and he drains the batteries of those instruments because of the lithium interaction with his body.      Past Medical History:  Diagnosis Date  . ADHD   . Anxiety   . Depression   . Gender dysphoria   . PTSD (post-traumatic stress disorder)     There are no active problems to display for this patient.   Past Surgical History:  Procedure Laterality Date  . FACIAL FRACTURE SURGERY      Prior to Admission medications   Not on File    Allergies Lithium and Tegretol [carbamazepine]  History reviewed. No pertinent family history.  Social History Social History   Tobacco Use  . Smoking status: Never Smoker  . Smokeless tobacco: Never Used  Substance Use Topics  . Alcohol use: No  . Drug use: Never    Review of Systems  Constitutional: No fever/chills Eyes: No visual changes. ENT: No sore throat. Cardiovascular: Denies chest pain.  Respiratory: Denies shortness of breath. Gastrointestinal: No abdominal pain.  No nausea, no vomiting.  No diarrhea.  No constipation. Genitourinary: Negative for dysuria. Musculoskeletal: Negative for back pain. Skin: Negative for rash. Neurological: Negative for headaches, focal weakness   ____________________________________________   PHYSICAL EXAM:  VITAL SIGNS: ED Triage Vitals  Enc Vitals Group     BP 01/28/19 1539 115/88     Pulse Rate 01/28/19 1539 (!) 111     Resp 01/28/19 1539 18     Temp 01/28/19 1539 98.7 F (37.1 C)     Temp Source 01/28/19 1539 Oral     SpO2 01/28/19 1539 97 %     Weight 01/28/19 1550 109 lb 11.2 oz (49.8 kg)     Height 01/28/19 1550 5' 7.5" (1.715 m)     Head Circumference --      Peak Flow --      Pain Score 01/28/19 1550 0     Pain Loc --      Pain Edu? --      Excl. in [place]? --    Constitutional: Alert and oriented. Well appearing and in no acute distress. Eyes: Conjunctivae are normal.  Head: Atraumatic. Nose: No congestion/rhinnorhea. Mouth/Throat: Mucous membranes are moist.  Oropharynx non-erythematous. Neck: No stridor.   Cardiovascular: Normal rate, regular rhythm. Grossly normal heart sounds.  Good peripheral circulation. Respiratory: Normal  respiratory effort.  No retractions. Lungs CTAB. Gastrointestinal: Soft and nontender. No distention. No abdominal bruits. No CVA tenderness. Musculoskeletal: No lower extremity tenderness nor edema.   Neurologic:  Normal speech and language. No gross focal neurologic deficits are appreciated.  Skin:  Skin is warm, dry and intact. No rash noted.   ____________________________________________   LABS (all labs ordered are listed, but only abnormal results are displayed)  Labs Reviewed  COMPREHENSIVE METABOLIC PANEL - Abnormal; Notable for the following components:      Result Value   Sodium 134 (*)    Glucose, Bld 111 (*)    Total Protein 8.3 (*)    Albumin 5.3 (*)    All other  components within normal limits  ACETAMINOPHEN LEVEL - Abnormal; Notable for the following components:   Acetaminophen (Tylenol), Serum <10 (*)    All other components within normal limits  ETHANOL  SALICYLATE LEVEL  CBC  URINE DRUG SCREEN, QUALITATIVE ([facility] ONLY)   ____________________________________________  EKG   ____________________________________________  RADIOLOGY  ED MD interpretation:    Official radiology report(s): No results found.  ____________________________________________   PROCEDURES  Procedure(s) performed (including Critical Care):  Procedures   ____________________________________________   INITIAL IMPRESSION / ASSESSMENT AND PLAN / ED COURSE  Patient is here voluntarily.  He is very anxious and worried and depressed feeling.             ____________________________________________   FINAL CLINICAL IMPRESSION(S) / ED DIAGNOSES  Final diagnoses:  Depression, unspecified depression type     ED Discharge Orders    None       Note:  This document was prepared using Dragon voice recognition software and may include unintentional dictation errors.    Arnaldo NatalMalinda, Ahmir Bracken F, MD 01/28/19 [id number]

## 2019-01-28 NOTE — ED Notes (Signed)
Patient assigned to appropriate care area   Introduced self to pt  Patient oriented to unit/care area: Informed that, for their safety, care areas are designed for safety and visiting and phone hours explained to patient. Patient verbalizes understanding, and verbal contract for safety obtained  Environment secured    Patient voluntary. Patient reports "I have been thinking about hurting myself lately" Denies specific plan. Reports a lot of things have happened lately to have thoughts of suicide but "does not want to talk about it" Denies HI at this time.  Patient says she has Joellen Jersey, Asencion Partridge, Museum/gallery conservator who are her guardian angels who she refers to as friends and they are with her.

## 2019-01-28 NOTE — ED Notes (Signed)
Patient has laptop in grey bookbag
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
## 2019-01-28 NOTE — ED Notes (Signed)
Pt came in with 2 blue tennis shoes, 2 green socks, black t-shirt, white bra, denim capris, pink underwear, 1 cell phone, head phones, apple watch, gold hoop earrings. 3 additional bags.

## 2019-01-28 NOTE — ED Notes (Signed)
Hourly rounding reveals patient in room. No complaints, stable, in no acute distress. Q15 minute rounds and monitoring via Security Cameras to continue. 

## 2019-01-28 NOTE — ED Notes (Signed)
Report to include Situation, Background, Assessment, and Recommendations received from Amy B. RN. Patient alert and oriented, warm and dry, in no acute distress. Patient denies HI and pain. Patient states he has SI without a plan and hears voices without command and sees "Ember" the dog on his bed.Patient made aware of Q15 minute rounds and security cameras for their safety. Patient instructed to come to me with needs or concerns.

## 2019-01-28 NOTE — ED Triage Notes (Addendum)
Patient voluntary. Patient reports "I have been thinking about hurting myself lately" Denies specific plan. Reports a lot of things have happened lately to have thoughts of suicide but "does not want to talk about it" Denies HI at this time. Denies using alcohol/drugs

## 2019-01-29 DIAGNOSIS — F32A Depression, unspecified: Secondary | ICD-10-CM | POA: Diagnosis present

## 2019-01-29 DIAGNOSIS — F909 Attention-deficit hyperactivity disorder, unspecified type: Secondary | ICD-10-CM

## 2019-01-29 DIAGNOSIS — F419 Anxiety disorder, unspecified: Secondary | ICD-10-CM | POA: Diagnosis present

## 2019-01-29 DIAGNOSIS — Z79890 Hormone replacement therapy (postmenopausal): Secondary | ICD-10-CM

## 2019-01-29 DIAGNOSIS — R45851 Suicidal ideations: Secondary | ICD-10-CM

## 2019-01-29 DIAGNOSIS — F329 Major depressive disorder, single episode, unspecified: Secondary | ICD-10-CM

## 2019-01-29 DIAGNOSIS — F4310 Post-traumatic stress disorder, unspecified: Secondary | ICD-10-CM

## 2019-01-29 MED ORDER — FLUOXETINE HCL 20 MG PO CAPS
20.0000 mg | ORAL_CAPSULE | Freq: Every day | ORAL | Status: DC
  Administered 2019-01-29 – 2019-01-30 (×2): 20 mg via ORAL
  Filled 2019-01-29 (×2): qty 1

## 2019-01-29 MED ORDER — ESTRADIOL 1 MG PO TABS
3.0000 mg | ORAL_TABLET | Freq: Two times a day (BID) | ORAL | Status: DC
  Administered 2019-01-29 – 2019-01-30 (×2): 3 mg via ORAL
  Filled 2019-01-29 (×3): qty 3

## 2019-01-29 MED ORDER — SPIRONOLACTONE 100 MG PO TABS
100.0000 mg | ORAL_TABLET | Freq: Two times a day (BID) | ORAL | Status: DC
  Administered 2019-01-29 – 2019-01-30 (×3): 100 mg via ORAL
  Filled 2019-01-29 (×4): qty 1

## 2019-01-29 MED ORDER — ESTRADIOL 1 MG PO TABS
1.0000 mg | ORAL_TABLET | Freq: Once | ORAL | Status: DC
  Filled 2019-01-29: qty 1

## 2019-01-29 MED ORDER — ESTRADIOL 1 MG PO TABS
3.0000 mg | ORAL_TABLET | Freq: Every day | ORAL | Status: DC
  Filled 2019-01-29: qty 3

## 2019-01-29 MED ORDER — RISPERIDONE 1 MG PO TABS
1.0000 mg | ORAL_TABLET | Freq: Two times a day (BID) | ORAL | Status: DC
  Administered 2019-01-29 – 2019-01-30 (×3): 1 mg via ORAL
  Filled 2019-01-29 (×3): qty 1

## 2019-01-29 MED ORDER — ESTRADIOL 1 MG PO TABS
2.0000 mg | ORAL_TABLET | Freq: Every day | ORAL | Status: DC
  Filled 2019-01-29: qty 2

## 2019-01-29 NOTE — ED Notes (Signed)
VOL/Psych Consult pending 

## 2019-01-29 NOTE — ED Notes (Signed)

## 2019-01-29 NOTE — ED Notes (Signed)
Hourly rounding reveals patient sleeping in room. No complaints, stable, in no acute distress. Q15 minute rounds and monitoring via Security Cameras to continue. 

## 2019-01-29 NOTE — ED Notes (Signed)
BEHAVIORAL HEALTH ROUNDING Patient sleeping: Yes.   Patient alert and oriented: eyes closed  Appears asleep Behavior appropriate: Yes.  ; If no, describe:  Nutrition and fluids offered: Yes  Toileting and hygiene offered: sleeping Sitter present: q 15 minute observations and security camera monitoring Law enforcement present: yes  ODS 

## 2019-01-29 NOTE — ED Notes (Signed)
BEHAVIORAL HEALTH ROUNDING Patient sleeping: No. Patient alert and oriented: yes Behavior appropriate: Yes.  ; If no, describe:  Nutrition and fluids offered: yes Toileting and hygiene offered: Yes  Sitter present: q15 minute observations and security camera monitoring Law enforcement present: Yes  ODS  

## 2019-01-29 NOTE — ED Notes (Signed)
Pt walked to bathroom. Pt reports feeling sick to stomach. Pt denies N/V/D at this time. VS checked

## 2019-01-29 NOTE — BH Assessment (Signed)
Referral information for Psychiatric Hospitalization faxed to;    [facility] ([phone]),    [facility] ([phone]--[phone])   [facility] ([phone])   [facility] ([phone]---[phone]---[phone]),   [facility] ([phone], [phone], [phone] or [phone]),    [facility] ([phone] or [phone])   [facility] ([phone]),    [facility] ([phone]),    [facility] ([phone])   [facility] ([phone])  

## 2019-01-29 NOTE — ED Notes (Signed)
Report received from [place] observed lying in bed - watching TV   Pt visualized with NAD  No verbalized needs or concerns at this time
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
## 2019-01-29 NOTE — ED Notes (Addendum)
ED BHU [place] Is the patient under IVC or is there intent for IVC: vol Is the patient medically cleared: Yes.   Is there vacancy in the ED BHU: Yes.   Is the population mix appropriate for patient: Yes.   Is the patient awaiting placement in inpatient or outpatient setting:  Has the patient had a psychiatric consult: Yes.   - reevaluation pending Survey of unit performed for contraband, proper placement and condition of furniture, tampering with fixtures in bathroom, shower, and each patient room: Yes.  ; Findings:  APPEARANCE/BEHAVIOR Calm and cooperative NEURO ASSESSMENT Orientation: oriented x3  Denies pain Hallucinations: No.None noted (Hallucinations)  Denies Speech: Normal Gait: normal RESPIRATORY ASSESSMENT Even  Unlabored respirations  CARDIOVASCULAR ASSESSMENT Pulses equal   regular rate  Skin warm and dry   GASTROINTESTINAL ASSESSMENT no GI complaint EXTREMITIES Full ROM  PLAN OF CARE Provide calm/safe environment. Vital signs assessed twice daily. ED BHU Assessment once each 12-hour shift. Collaborate with TTS daily or as condition indicates. Assure the ED provider has rounded once each shift. Provide and encourage hygiene. Provide redirection as needed. Assess for escalating behavior; address immediately and inform ED provider.  Assess family dynamic and appropriateness for visitation as needed: Yes.  ; If necessary, describe findings:  Educate the patient/family about BHU procedures/visitation: Yes.  ; If necessary, describe findings:

## 2019-01-30 MED ORDER — RISPERIDONE 2 MG PO TABS: 1.0000 mg | ORAL_TABLET | Freq: Every day | ORAL | 2 refills | Status: DC

## 2019-01-30 MED ORDER — FLUOXETINE HCL 20 MG PO CAPS: 20.0000 mg | ORAL_CAPSULE | Freq: Every day | ORAL | 2 refills | Status: DC

## 2019-01-30 MED ORDER — ESTRADIOL 1 MG PO TABS: 3.0000 mg | ORAL_TABLET | Freq: Two times a day (BID) | ORAL | Status: DC

## 2019-01-30 NOTE — ED Provider Notes (Signed)
-----------------------------------------   12:36 PM on 01/30/2019 -----------------------------------------  The patient has been evaluated by Dr. Jeannette How and is cleared for discharge.  Prescriptions and return precautions have been provided.   Arta Silence, MD 01/30/19 [phone]

## 2019-01-30 NOTE — ED Notes (Signed)
Pt given lunch tray.
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
## 2019-01-30 NOTE — ED Notes (Signed)
VOL, PENDING CONSULT 

## 2019-01-30 NOTE — ED Notes (Signed)
Pt given cup of sprite.  

## 2019-01-30 NOTE — ED Provider Notes (Signed)
-----------------------------------------   7:17 AM on 01/30/2019 -----------------------------------------   Blood pressure 100/61, pulse 87, temperature 98 F (36.7 C), temperature source Oral, resp. rate 16, height 5' 7.5" (1.715 m), weight 49.8 kg, SpO2 99 %.  The patient is calm and cooperative at this time.  There have been no acute events since the last update.  Awaiting disposition plan from Behavioral Medicine team.   Arta Silence, MD 01/30/19 [phone]

## 2019-01-30 NOTE — ED Notes (Signed)
Pt given breakfast tray

## 2019-01-31 LAB — NOVEL CORONAVIRUS, NAA (HOSP ORDER, SEND-OUT TO REF LAB; TAT 18-24 HRS): SARS-CoV-2, NAA: NOT DETECTED

## 2019-02-03 ENCOUNTER — Other Ambulatory Visit: Payer: Self-pay

## 2019-02-03 ENCOUNTER — Emergency Department: Payer: Medicaid Other

## 2019-02-03 ENCOUNTER — Emergency Department
Admission: EM | Admit: 2019-02-03 | Discharge: 2019-02-03 | Disposition: A | Discharge status: Home / Self Care | Payer: Medicaid Other | Attending: Emergency Medicine | Admitting: Emergency Medicine

## 2019-02-03 DIAGNOSIS — F909 Attention-deficit hyperactivity disorder, unspecified type: Secondary | ICD-10-CM | POA: Insufficient documentation

## 2019-02-03 DIAGNOSIS — R0602 Shortness of breath: Secondary | ICD-10-CM | POA: Insufficient documentation

## 2019-02-03 DIAGNOSIS — R000 Tachycardia, unspecified: Secondary | ICD-10-CM

## 2019-02-03 DIAGNOSIS — R5383 Other fatigue: Secondary | ICD-10-CM

## 2019-02-03 DIAGNOSIS — Z79899 Other long term (current) drug therapy: Secondary | ICD-10-CM | POA: Insufficient documentation

## 2019-02-03 LAB — BASIC METABOLIC PANEL
Anion gap: 11 (ref 5–15)
BUN: 10 mg/dL (ref 6–20)
CO2: 26 mmol/L (ref 22–32)
Calcium: 9.5 mg/dL (ref 8.9–10.3)
Chloride: 98 mmol/L (ref 98–111)
Creatinine, Ser: 0.51 mg/dL — ABNORMAL LOW (ref 0.61–1.24)
GFR calc Af Amer: >60 mL/min (ref >60)
GFR calc non Af Amer: >60 mL/min (ref >60)
Glucose, Bld: 79 mg/dL (ref 70–99)
Potassium: 4.2 mmol/L (ref 3.5–5.1)
Sodium: 135 mmol/L (ref 135–145)

## 2019-02-03 LAB — CBC
HCT: 41.6 % (ref 39.0–52.0)
Hemoglobin: 14.7 g/dL (ref 13.0–17.0)
MCH: 29.8 pg (ref 26.0–34.0)
MCHC: 35.3 g/dL (ref 30.0–36.0)
MCV: 84.2 fL (ref 80.0–100.0)
Platelets: 280 10*3/uL (ref 150–400)
RBC: 4.94 MIL/uL (ref 4.22–5.81)
RDW: 11.9 % (ref 11.5–15.5)
WBC: 9.9 10*3/uL (ref 4.0–10.5)
nRBC: 0 % (ref 0.0–0.2)

## 2019-02-03 LAB — TROPONIN I: Troponin I: <0.03 ng/mL (ref <0.03)

## 2019-02-03 NOTE — ED Provider Notes (Signed)
[facility] Emergency Department Provider Note   ____________________________________________   I have reviewed the triage vital signs and the nursing notes.   HISTORY  Chief Complaint Tachycardia and Shortness of Breath   History limited by: Not Limited   HPI George [name] is a 23 y.o. adult who presents to the emergency department today with concerns for some tachycardia, fatigue and shortness of breath.  Patient states that the symptoms have been present for the past 2 days.  He came in today because he was not feeling any improvement.  The patient states that he recently was started on two new medications. Denies any fevers.  Records reviewed. Per medical record review patient has a history of recent psychiatric assessment. Per note on risperdal and fluoxetine.   Past Medical History:  Diagnosis Date  . ADHD   . Anxiety   . Depression   . Gender dysphoria   . PTSD (post-traumatic stress disorder)     Patient Active Problem List   Diagnosis Date Noted  . Depression 01/29/2019  . Hormone replacement therapy 01/29/2019  . Suicidal ideation 01/29/2019  . Anxiety 01/29/2019  . PTSD (post-traumatic stress disorder) 01/29/2019  . ADHD 01/29/2019    Past Surgical History:  Procedure Laterality Date  . FACIAL FRACTURE SURGERY      Prior to Admission medications   Medication Sig Start Date End Date Taking? Authorizing Provider  estradiol (ESTRACE) 1 MG tablet Take 3 tablets (3 mg total) by mouth 2 (two) times a day. Documentation only 01/30/19   George Hammock, MD  FLUoxetine (PROZAC) 20 MG capsule Take 1 capsule (20 mg total) by mouth daily. 01/31/19   George Hammock, MD  ibuprofen (ADVIL) 200 MG tablet Take 400-800 mg by mouth every 6 (six) hours as needed for fever, mild pain or moderate pain.    [provider]  risperiDONE (RISPERDAL) 2 MG tablet Take 0.5 tablets (1 mg total) by mouth at bedtime. 01/30/19   George Hammock, MD   spironolactone (ALDACTONE) 50 MG tablet Take 100 mg by mouth 2 (two) times daily.    [provider]    Allergies Lithium and Tegretol [carbamazepine]  No family history on file.  Social History Social History   Tobacco Use  . Smoking status: Never Smoker  . Smokeless tobacco: Never Used  Substance Use Topics  . Alcohol use: No  . Drug use: Never    Review of Systems Constitutional: Positive for fatigue Eyes: No visual changes. ENT: No sore throat. Cardiovascular: Positive for tachycardia.  Respiratory: Positive for  shortness of breath. Gastrointestinal: No abdominal pain.  No nausea, no vomiting.  No diarrhea.   Genitourinary: Negative for dysuria. Musculoskeletal: Negative for back pain. Skin: Negative for rash. Neurological: Negative for headaches, focal weakness or numbness.  ____________________________________________   PHYSICAL EXAM:  VITAL SIGNS: ED Triage Vitals  Enc Vitals Group     BP 02/03/19 1950 (!) 121/91     Pulse Rate 02/03/19 1950 (!) 110     Resp 02/03/19 1950 18     Temp 02/03/19 1950 (!) 97.4 F (36.3 C)     Temp src --      SpO2 02/03/19 1950 99 %     Weight 02/03/19 1953 109 lb 11.2 oz (49.8 kg)     Height 02/03/19 1953 5' 7.5" (1.715 m)     Head Circumference --      Peak Flow --      Pain Score 02/03/19 1951  7   Constitutional: Alert and oriented.  Eyes: Conjunctivae are normal.  ENT      Head: Normocephalic and atraumatic.      Nose: No congestion/rhinnorhea.      Mouth/Throat: Mucous membranes are moist.      Neck: No stridor. Hematological/Lymphatic/Immunilogical: No cervical lymphadenopathy. Cardiovascular: Tachycardic, regular rhythm.  No murmurs, rubs, or gallops. Respiratory: Normal respiratory effort without tachypnea nor retractions. Breath sounds are clear and equal bilaterally. No wheezes/rales/rhonchi. Gastrointestinal: Soft and non tender. No rebound. No guarding.  Genitourinary:  Deferred Musculoskeletal: Normal range of motion in all extremities. No lower extremity edema. Neurologic:  Normal speech and language. No gross focal neurologic deficits are appreciated.  Skin:  Skin is warm, dry and intact. No rash noted. Psychiatric: Mood and affect are normal. Speech and behavior are normal. Patient exhibits appropriate insight and judgment.  ____________________________________________    LABS (pertinent positives/negatives)  Trop <0.03 CBC wnl BMP wnl except cr 0.51  ____________________________________________   EKG  I, George [name], attending physician, personally viewed and interpreted this EKG  EKG Time: 1959 Rate: 96 Rhythm: normal sinus rhythm Axis: normal  Intervals: qtc 429 QRS: narrow ST changes: no st elevation Impression: normal ekg  ____________________________________________    RADIOLOGY  CXR No acute abnormality  ____________________________________________   PROCEDURES  Procedures  ____________________________________________   INITIAL IMPRESSION / ASSESSMENT AND PLAN / ED COURSE  Pertinent labs & imaging results that were available during my care of the patient were reviewed by me and considered in my medical decision making (see chart for details).   Patient presented to the emergency department today with concerns for fast heart rate, fatigue, shortness of breath.  On exam patient appears a little fatigued.  Did have some mild tachycardia although this would resolve on its own.  Patient's blood work without any concerning anemia, leukocytosis.  Patient's electrolytes within normal limits.  Chest x-ray without any acute findings.  Do wonder if it could be related to patient's medication.  Did discuss and offer for patient to speak to psychiatrist here.  Patient however felt comfortable deferring and calling on Monday.  Did discuss return precautions for any worsening  symptoms.  ____________________________________________   FINAL CLINICAL IMPRESSION(S) / ED DIAGNOSES  Final diagnoses:  Sinus tachycardia  Other fatigue  Shortness of breath   Note: This dictation was prepared with Dragon dictation. Any transcriptional errors that result from this process are unintentional     George [name], Jailyne Chieffo, MD 02/03/19 [phone]

## 2019-02-03 NOTE — Discharge Instructions (Addendum)
Please seek medical attention for any high fevers, chest pain, shortness of breath, change in behavior, persistent vomiting, bloody stool or any other new or concerning symptoms.  

## 2019-02-03 NOTE — ED Triage Notes (Signed)
Patient c/o tachycardia (HR 183 at home), SOB, and abdominal pain.

## 2019-02-03 NOTE — ED Notes (Signed)
PT appears to be sleeping. Respirations even and unlabored. Heart rate has come down from ST to NSR at 79bpm

## 2019-02-19 ENCOUNTER — Other Ambulatory Visit: Payer: Self-pay

## 2019-02-19 DIAGNOSIS — F909 Attention-deficit hyperactivity disorder, unspecified type: Secondary | ICD-10-CM | POA: Insufficient documentation

## 2019-02-19 DIAGNOSIS — Z79899 Other long term (current) drug therapy: Secondary | ICD-10-CM | POA: Insufficient documentation

## 2019-02-19 DIAGNOSIS — F329 Major depressive disorder, single episode, unspecified: Secondary | ICD-10-CM | POA: Insufficient documentation

## 2019-02-19 DIAGNOSIS — R45851 Suicidal ideations: Secondary | ICD-10-CM | POA: Insufficient documentation

## 2019-02-19 DIAGNOSIS — Z20828 Contact with and (suspected) exposure to other viral communicable diseases: Secondary | ICD-10-CM | POA: Insufficient documentation

## 2019-02-19 NOTE — ED Triage Notes (Signed)
Pt brought in voluntarily for suicidal ideation by [facility]

## 2019-02-20 ENCOUNTER — Emergency Department
Admission: EM | Admit: 2019-02-20 | Discharge: 2019-02-21 | Disposition: A | Discharge status: Other Acute Inpatient Hospital | Payer: Medicaid Other | Attending: Emergency Medicine | Admitting: Emergency Medicine

## 2019-02-20 DIAGNOSIS — F4310 Post-traumatic stress disorder, unspecified: Secondary | ICD-10-CM | POA: Diagnosis present

## 2019-02-20 DIAGNOSIS — F329 Major depressive disorder, single episode, unspecified: Secondary | ICD-10-CM | POA: Diagnosis present

## 2019-02-20 DIAGNOSIS — Z79890 Hormone replacement therapy (postmenopausal): Secondary | ICD-10-CM

## 2019-02-20 DIAGNOSIS — R45851 Suicidal ideations: Secondary | ICD-10-CM

## 2019-02-20 DIAGNOSIS — F32A Depression, unspecified: Secondary | ICD-10-CM | POA: Diagnosis present

## 2019-02-20 DIAGNOSIS — F909 Attention-deficit hyperactivity disorder, unspecified type: Secondary | ICD-10-CM | POA: Diagnosis present

## 2019-02-20 DIAGNOSIS — F419 Anxiety disorder, unspecified: Secondary | ICD-10-CM | POA: Diagnosis present

## 2019-02-20 LAB — COMPREHENSIVE METABOLIC PANEL
ALT: 13 U/L (ref 0–44)
AST: 16 U/L (ref 15–41)
Albumin: 4.7 g/dL (ref 3.5–5.0)
Alkaline Phosphatase: 46 U/L (ref 38–126)
Anion gap: 9 (ref 5–15)
BUN: 20 mg/dL (ref 6–20)
CO2: 24 mmol/L (ref 22–32)
Calcium: 9 mg/dL (ref 8.9–10.3)
Chloride: 104 mmol/L (ref 98–111)
Creatinine, Ser: 0.79 mg/dL (ref 0.61–1.24)
GFR calc Af Amer: >60 mL/min (ref >60)
GFR calc non Af Amer: >60 mL/min (ref >60)
Glucose, Bld: 98 mg/dL (ref 70–99)
Potassium: 4.1 mmol/L (ref 3.5–5.1)
Sodium: 137 mmol/L (ref 135–145)
Total Bilirubin: 0.5 mg/dL (ref 0.3–1.2)
Total Protein: 7.1 g/dL (ref 6.5–8.1)

## 2019-02-20 LAB — URINE DRUG SCREEN, QUALITATIVE (ARMC ONLY)
Amphetamines, Ur Screen: NOT DETECTED
Barbiturates, Ur Screen: NOT DETECTED
Benzodiazepine, Ur Scrn: NOT DETECTED
Cannabinoid 50 Ng, Ur ~~LOC~~: NOT DETECTED
Cocaine Metabolite,Ur ~~LOC~~: NOT DETECTED
MDMA (Ecstasy)Ur Screen: NOT DETECTED
Methadone Scn, Ur: NOT DETECTED
Opiate, Ur Screen: NOT DETECTED
Phencyclidine (PCP) Ur S: NOT DETECTED
Tricyclic, Ur Screen: NOT DETECTED

## 2019-02-20 LAB — URINALYSIS, COMPLETE (UACMP) WITH MICROSCOPIC
Bacteria, UA: NONE SEEN
Bilirubin Urine: NEGATIVE
Glucose, UA: NEGATIVE mg/dL
Hgb urine dipstick: NEGATIVE
Ketones, ur: 20 mg/dL — AB
Leukocytes,Ua: NEGATIVE
Nitrite: NEGATIVE
Protein, ur: 30 mg/dL — AB
Specific Gravity, Urine: 1.027 (ref 1.005–1.030)
pH: 5 (ref 5.0–8.0)

## 2019-02-20 LAB — CBC
HCT: 36.3 % — ABNORMAL LOW (ref 39.0–52.0)
Hemoglobin: 12.9 g/dL — ABNORMAL LOW (ref 13.0–17.0)
MCH: 30.2 pg (ref 26.0–34.0)
MCHC: 35.5 g/dL (ref 30.0–36.0)
MCV: 85 fL (ref 80.0–100.0)
Platelets: 260 10*3/uL (ref 150–400)
RBC: 4.27 MIL/uL (ref 4.22–5.81)
RDW: 12.2 % (ref 11.5–15.5)
WBC: 10.7 10*3/uL — ABNORMAL HIGH (ref 4.0–10.5)
nRBC: 0 % (ref 0.0–0.2)

## 2019-02-20 LAB — ETHANOL: Alcohol, Ethyl (B): <10 mg/dL (ref <10)

## 2019-02-20 LAB — SARS CORONAVIRUS 2 BY RT PCR (HOSPITAL ORDER, PERFORMED IN ~~LOC~~ HOSPITAL LAB): SARS Coronavirus 2: NEGATIVE

## 2019-02-20 MED ORDER — ONDANSETRON 4 MG PO TBDP
8.0000 mg | ORAL_TABLET | Freq: Once | ORAL | Status: AC
  Administered 2019-02-20: 8 mg via ORAL
  Filled 2019-02-20: qty 2

## 2019-02-20 MED ORDER — TRAZODONE HCL 50 MG PO TABS
50.0000 mg | ORAL_TABLET | Freq: Every day | ORAL | Status: DC
  Administered 2019-02-20: 50 mg via ORAL
  Filled 2019-02-20: qty 1

## 2019-02-20 MED ORDER — ALUM & MAG HYDROXIDE-SIMETH 200-200-20 MG/5ML PO SUSP
30.0000 mL | Freq: Once | ORAL | Status: AC
  Administered 2019-02-20: 02:00:00 30 mL via ORAL
  Filled 2019-02-20: qty 30

## 2019-02-20 MED ORDER — FAMOTIDINE 20 MG PO TABS
20.0000 mg | ORAL_TABLET | Freq: Once | ORAL | Status: AC
  Administered 2019-02-20: 20 mg via ORAL
  Filled 2019-02-20: qty 1

## 2019-02-20 NOTE — Consult Note (Signed)
[facility] Face-to-Face Psychiatry Consult   Reason for Consult: Suicidal ideation Referring Physician: Dr. Scotty Court Patient Identification: George [name] MRN:  [id number] Principal Diagnosis: Suicidal ideation Diagnosis:  Principal Problem:   Suicidal ideation Active Problems:   Depression   Hormone replacement therapy   Anxiety   PTSD (post-traumatic stress disorder)   ADHD   Total Time spent with patient: 45 minutes  Subjective: "I am suicidal with a plan.  I plan choke myself, stab myself, or drown". George [name] is a 23 y.o. adult patient presented to [facility] ED voluntarily. [name] reports that she came in today because she wanted to die.   She reports that she has been feeling this way "off and on since yesterday".  She states that "the feelings came out of the blue". She reports that she is having trouble finding shelter. She is stressed that she is having a hard time earning an income. She reports a history of PTSD, ADHD, Gender Dysphoria, and Depression. She reports low self-esteem.  Symptoms of depression are reported at this time.  She denied changes in her sleeping patterns, and is not eating "I'm starving myself". She denied having auditory or visual hallucinations.  She reports that she has multiple personalities, "Its undiagnosed, but I have them".  She denied homicidal ideation or intent.  She reports suicidal ideation with a plan to "Choke myself, stab myself, or drown". She denied the use of drugs and alcohol.      The patient was seen face-to-face by this provider; chart reviewed and consulted with Dr. Scotty Court on call on 02/20/2019 due to the care of the patient. It was discussed with the provider that the patient does meet criteria to be admitted to the inpatient unit.  On evaluation the patient is alert and oriented x4, calm and cooperative, and mood-congruent with affect. The patient does not appear to be responding to internal or external stimuli. Neither is the patient presenting  with any delusional thinking. The patient denies auditory or visual hallucinations. The patient admits to suicidal and self-harm ideations but denies homicidal ideations. The patient is not presenting with any psychotic or paranoid behaviors. During an encounter with the patient, she was able to answer questions appropriately.  Plan: The patient is a safety risk to self and does require psychiatric inpatient admission for stabilization and treatment.    HPI: Per Dr. Bradd Canary; George [name] is a 23 y.o. adult with a history of ADHD anxiety depression and gender dysphoria who comes to the ED complaining of SI and depression.  Feels that up with "everything."  She notes that earlier today while in a [employer] she used her right hand to try and strangle herself and began to cry.  She then stopped because she noticed she was being watched by a small child.  She still feels suicidal.  Also complains of generalized abdominal pain, gradual onset waxing and waning no aggravating or alleviating factors nonradiating.  No vomiting or diarrhea.  No fevers or chills.  Past Psychiatric History: ADHD Anxiety Depression Gender dysphoria PTSD (post-traumatic stress disorder)  Risk to Self: Suicidal Ideation: Yes-Currently Present Suicidal Intent: Yes-Currently Present Is patient at risk for suicide?: Yes Suicidal Plan?: Yes-Currently Present Specify Current Suicidal Plan: Choke, stab self, or drown Access to Means: Yes What has been your use of drugs/alcohol within the last 12 months?: denied use How many times?: 20 Other Self Harm Risks: None Triggers for Past Attempts: Other (Comment)("every thing and nothing") Intentional Self Injurious Behavior: None Risk to  Others: Homicidal Ideation: No Thoughts of Harm to Others: No Current Homicidal Intent: No Current Homicidal Plan: No Access to Homicidal Means: No Identified Victim: None identified History of harm to others?: No Assessment of  Violence: None Noted Violent Behavior Description: denied Does patient have access to weapons?: No Criminal Charges Pending?: No Does patient have a court date: No Prior Inpatient Therapy: Prior Inpatient Therapy: Yes Prior Therapy Dates: 2016 and prior Prior Therapy Facilty/Provider(s): [place], [place], [place]  Reason for Treatment: Depression & SI Prior Outpatient Therapy: Prior Outpatient Therapy: Yes Prior Therapy Dates: Pending intake [facility] Reason for Treatment: Depression & PTSD Does patient have an ACCT team?: No Does patient have Intensive In-House Services?  : No Does patient have [facility] services? : No Does patient have P4CC services?: No  Past Medical History:  Past Medical History:  Diagnosis Date  . ADHD   . Anxiety   . Depression   . Gender dysphoria   . PTSD (post-traumatic stress disorder)     Past Surgical History:  Procedure Laterality Date  . FACIAL FRACTURE SURGERY     Family History: No family history on file. Family Psychiatric  History:  Social History:  Social History   Substance and Sexual Activity  Alcohol Use No     Social History   Substance and Sexual Activity  Drug Use Never    Social History   Socioeconomic History  . Marital status: Single    Spouse name: Not on file  . Number of children: Not on file  . Years of education: Not on file  . Highest education level: Not on file  Occupational History  . Not on file  Social Needs  . Financial resource strain: Not on file  . Food insecurity    Worry: Not on file    Inability: Not on file  . Transportation needs    Medical: Not on file    Non-medical: Not on file  Tobacco Use  . Smoking status: Never Smoker  . Smokeless tobacco: Never Used  Substance and Sexual Activity  . Alcohol use: No  . Drug use: Never  . Sexual activity: Not on file  Lifestyle  . Physical activity     Days per week: Not on file    Minutes per session: Not on file  . Stress: Not on file  Relationships  . Social Herbalist on phone: Not on file    Gets together: Not on file    Attends religious service: Not on file    Active member of club or organization: Not on file    Attends meetings of clubs or organizations: Not on file    Relationship status: Not on file  Other Topics Concern  . Not on file  Social History Narrative  . Not on file   Additional Social History:    Allergies:   Allergies  Allergen Reactions  . Lithium   . Tegretol [Carbamazepine]     Labs:  Results for orders placed or performed during the hospital encounter of 02/20/19 (from the past 48 hour(s))  CBC     Status: Abnormal   Collection Time: 02/20/19 12:14 AM  Result Value Ref Range   WBC 10.7 (H) 4.0 - 10.5 K/uL   RBC 4.27 4.22 - 5.81 MIL/uL   Hemoglobin 12.9 (L) 13.0 - 17.0 g/dL   HCT 36.3 (L) 39.0 - 52.0 %   MCV 85.0 80.0 -  100.0 fL   MCH 30.2 26.0 - 34.0 pg   MCHC 35.5 30.0 - 36.0 g/dL   RDW 11.912.2 14.711.5 - 82.915.5 %   Platelets 260 150 - 400 K/uL   nRBC 0.0 0.0 - 0.2 %    Comment: Performed at [facility], [address] [place], [place] [address]  Comprehensive metabolic panel     Status: None   Collection Time: 02/20/19 12:14 AM  Result Value Ref Range   Sodium 137 135 - 145 mmol/L   Potassium 4.1 3.5 - 5.1 mmol/L   Chloride 104 98 - 111 mmol/L   CO2 24 22 - 32 mmol/L   Glucose, Bld 98 70 - 99 mg/dL   BUN 20 6 - 20 mg/dL   Creatinine, Ser 0.860.79 0.61 - 1.24 mg/dL   Calcium 9.0 8.9 - 57.810.3 mg/dL   Total Protein 7.1 6.5 - 8.1 g/dL   Albumin 4.7 3.5 - 5.0 g/dL   AST 16 15 - 41 U/L   ALT 13 0 - 44 U/L   Alkaline Phosphatase 46 38 - 126 U/L   Total Bilirubin 0.5 0.3 - 1.2 mg/dL   GFR calc non Af Amer >60 >60 mL/min   GFR calc Af Amer >60 >60 mL/min   Anion gap 9 5 - 15    Comment: Performed at [facility], [address] [place], [place] [address]  Ethanol      Status: None   Collection Time: 02/20/19 12:14 AM  Result Value Ref Range   Alcohol, Ethyl (B) <10 <10 mg/dL    Comment: (NOTE) Lowest detectable limit for serum alcohol is 10 mg/dL. For medical purposes only. Performed at [facility], [address] [place], [place] [address]   Urinalysis, Complete w Microscopic     Status: Abnormal   Collection Time: 02/20/19 12:14 AM  Result Value Ref Range   Color, Urine YELLOW (A) YELLOW   APPearance HAZY (A) CLEAR   Specific Gravity, Urine 1.027 1.005 - 1.030   pH 5.0 5.0 - 8.0   Glucose, UA NEGATIVE NEGATIVE mg/dL   Hgb urine dipstick NEGATIVE NEGATIVE   Bilirubin Urine NEGATIVE NEGATIVE   Ketones, ur 20 (A) NEGATIVE mg/dL   Protein, ur 30 (A) NEGATIVE mg/dL   Nitrite NEGATIVE NEGATIVE   Leukocytes,Ua NEGATIVE NEGATIVE   RBC / HPF 0-5 0 - 5 RBC/hpf   WBC, UA 0-5 0 - 5 WBC/hpf   Bacteria, UA NONE SEEN NONE SEEN   Squamous Epithelial / LPF 0-5 0 - 5   Mucus PRESENT    Hyaline Casts, UA PRESENT     Comment: Performed at [facility], [address] [place], [place] [address]  Urine Drug Screen, Qualitative ([facility] only)     Status: None   Collection Time: 02/20/19 12:14 AM  Result Value Ref Range   Tricyclic, Ur Screen NONE DETECTED NONE DETECTED   Amphetamines, Ur Screen NONE DETECTED NONE DETECTED   MDMA (Ecstasy)Ur Screen NONE DETECTED NONE DETECTED   Cocaine Metabolite,Ur [place] NONE DETECTED NONE DETECTED   Opiate, Ur Screen NONE DETECTED NONE DETECTED   Phencyclidine (PCP) Ur S NONE DETECTED NONE DETECTED   Cannabinoid 50 Ng, Ur  NONE DETECTED NONE DETECTED   Barbiturates, Ur Screen NONE DETECTED NONE DETECTED   Benzodiazepine, Ur Scrn NONE DETECTED NONE DETECTED   Methadone Scn, Ur NONE DETECTED NONE DETECTED    Comment: (NOTE) Tricyclics + metabolites, urine    Cutoff 1000 ng/mL Amphetamines + metabolites, urine  Cutoff 1000 ng/mL MDMA (Ecstasy), urine              Cutoff 500 ng/mL Cocaine Metabolite,  urine          Cutoff 300 ng/mL Opiate + metabolites, urine        Cutoff 300 ng/mL Phencyclidine (PCP), urine         Cutoff 25 ng/mL Cannabinoid, urine                 Cutoff 50 ng/mL Barbiturates + metabolites, urine  Cutoff 200 ng/mL Benzodiazepine, urine              Cutoff 200 ng/mL Methadone, urine                   Cutoff 300 ng/mL The urine drug screen provides only a preliminary, unconfirmed analytical test result and should not be used for non-medical purposes. Clinical consideration and professional judgment should be applied to any positive drug screen result due to possible interfering substances. A more specific alternate chemical method must be used in order to obtain a confirmed analytical result. Gas chromatography / mass spectrometry (GC/MS) is the preferred confirmat ory method. Performed at [facility], [address] [place], [place] [address]     No current facility-administered medications for this encounter.    Current Outpatient Medications  Medication Sig Dispense Refill  . estradiol (ESTRACE) 1 MG tablet Take 3 tablets (3 mg total) by mouth 2 (two) times a day. Documentation only 180 tablet   . FLUoxetine (PROZAC) 20 MG capsule Take 1 capsule (20 mg total) by mouth daily. 30 capsule 2  . ibuprofen (ADVIL) 200 MG tablet Take 400-800 mg by mouth every 6 (six) hours as needed for fever, mild pain or moderate pain.    Marland Kitchen. risperiDONE (RISPERDAL) 2 MG tablet Take 0.5 tablets (1 mg total) by mouth at bedtime. 30 tablet 2  . spironolactone (ALDACTONE) 50 MG tablet Take 100 mg by mouth 2 (two) times daily.      Musculoskeletal: Strength & Muscle Tone: within normal limits Gait & Station: normal Patient leans: N/A  Psychiatric Specialty Exam: Physical Exam  Nursing note and vitals reviewed. Constitutional: She is oriented to person, place, and time. She appears well-developed and well-nourished.  HENT:  Head: Normocephalic and atraumatic.  Eyes:  Pupils are equal, round, and reactive to light. Conjunctivae are normal.  Neck: Normal range of motion. Neck supple.  Cardiovascular: Normal rate and regular rhythm.  Respiratory: Effort normal and breath sounds normal.  Musculoskeletal: Normal range of motion.  Neurological: She is alert and oriented to person, place, and time.  Skin: Skin is warm and dry.    Review of Systems  Psychiatric/Behavioral: Positive for depression and suicidal ideas. The patient is nervous/anxious and has insomnia.   All other systems reviewed and are negative.   Blood pressure 106/68, pulse 92, temperature 98.7 F (37.1 C), temperature source Oral, resp. rate 17, height 5\' 7"  (1.702 m), weight 49.4 kg, SpO2 99 %.Body mass index is 17.07 kg/m.  General Appearance: Fairly Groomed  Eye Contact:  Fair  Speech:  Clear and Coherent  Volume:  Normal  Mood:  Anxious and Depressed  Affect:  Inappropriate  Thought Process:  Coherent  Orientation:  Full (Time, Place, and Person)  Thought Content:  Logical  Suicidal Thoughts:  Yes.  with intent/plan  Homicidal Thoughts:  No  Memory:  Immediate;   Fair Recent;   Fair  Judgement:  Poor  Insight:  Lacking  Psychomotor Activity:  Normal  Concentration:  Attention Span: Fair  Recall:  FiservFair  Fund of Knowledge:  Fair  Language:  Good  Akathisia:  Negative  Handed:  Right  AIMS (if indicated):     Assets:  Desire for Improvement Financial Resources/Insurance Housing Social Support  ADL's:  Intact  Cognition:  WNL  Sleep:        Treatment Plan Summary: The patient does meet criteria for psychiatric inpatient admission once a bed becomes available.  Medication management   Disposition: Supportive therapy provided about ongoing stressors. The patient does require inpatient psychiatric admission once a bed becomes available for stabilization and treatment.  Catalina GravelJacqueline Thomspon, NP 02/20/2019 3:50 AM

## 2019-02-20 NOTE — ED Notes (Signed)
Pt came in with black and yellow shirt, black bra, grey underwear, denim shorts, purple socks, white shoes, hoop earrings, bracelet, 2 additional bags.

## 2019-02-20 NOTE — ED Notes (Signed)
Pt given sandwich tray and drink. 
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
## 2019-02-20 NOTE — ED Notes (Signed)
Psych NP speaking with patient 

## 2019-02-20 NOTE — ED Notes (Signed)
Referral information for Psychiatric Hospitalization faxed to;   . [facility] ([phone]),   . [facility] [phone] -or- [phone]) [phone]  . [facility] ([phone], [phone], [phone] or [phone]),   . [facility] ([phone] or [phone])  . [facility] ([phone]),   . [facility] ([phone]),   . [facility] ([phone]) 

## 2019-02-20 NOTE — ED Provider Notes (Addendum)
[facility] Emergency Department Provider Note  ____________________________________________  Time seen: Approximately 2:52 AM  I have reviewed the triage vital signs and the nursing notes.   HISTORY  Chief Complaint Suicidal    HPI George LacksMichael [name] is a 23 y.o. adult with a history of ADHD anxiety depression and gender dysphoria who comes to the ED complaining of SI and depression.  Feels fed up with "everything."  She notes that earlier today while [place] [employer] [place] she used her right hand to try and strangle herself and began to cry.  She then stopped because she noticed she was being watched by a small child.  She still feels suicidal.  Also complains of generalized abdominal pain, gradual onset waxing and waning no aggravating or alleviating factors nonradiating.  No vomiting or diarrhea.  No fevers or chills.      Past Medical History:  Diagnosis Date  . ADHD   . Anxiety   . Depression   . Gender dysphoria   . PTSD (post-traumatic stress disorder)      Patient Active Problem List   Diagnosis Date Noted  . Depression 01/29/2019  . Hormone replacement therapy 01/29/2019  . Suicidal ideation 01/29/2019  . Anxiety 01/29/2019  . PTSD (post-traumatic stress disorder) 01/29/2019  . ADHD 01/29/2019     Past Surgical History:  Procedure Laterality Date  . FACIAL FRACTURE SURGERY       Prior to Admission medications   Medication Sig Start Date End Date Taking? Authorizing Provider  estradiol (ESTRACE) 1 MG tablet Take 3 tablets (3 mg total) by mouth 2 (two) times a day. Documentation only 01/30/19   Mariel CraftMaurer, Sheila M, MD  FLUoxetine (PROZAC) 20 MG capsule Take 1 capsule (20 mg total) by mouth daily. 01/31/19   Mariel CraftMaurer, Sheila M, MD  ibuprofen (ADVIL) 200 MG tablet Take 400-800 mg by mouth every 6 (six) hours as needed for fever, mild pain or moderate pain.    [provider]  risperiDONE (RISPERDAL) 2 MG tablet Take 0.5 tablets (1 mg  total) by mouth at bedtime. 01/30/19   Mariel CraftMaurer, Sheila M, MD  spironolactone (ALDACTONE) 50 MG tablet Take 100 mg by mouth 2 (two) times daily.    [provider]     Allergies Lithium and Tegretol [carbamazepine]   No family history on file.  Social History Social History   Tobacco Use  . Smoking status: Never Smoker  . Smokeless tobacco: Never Used  Substance Use Topics  . Alcohol use: No  . Drug use: Never    Review of Systems  Constitutional:   No fever or chills.  ENT:   No sore throat. No rhinorrhea. Cardiovascular:   No chest pain or syncope. Respiratory:   No dyspnea or cough. Gastrointestinal:   Positive as above abdominal pain without vomiting and diarrhea.  Musculoskeletal:   Negative for focal pain or swelling All other systems reviewed and are negative except as documented above in ROS and HPI.  ____________________________________________   PHYSICAL EXAM:  VITAL SIGNS: ED Triage Vitals  Enc Vitals Group     BP 02/19/19 2359 106/68     Pulse Rate 02/19/19 2359 92     Resp 02/19/19 2359 17     Temp 02/19/19 2359 98.7 F (37.1 C)     Temp Source 02/19/19 2359 Oral     SpO2 02/19/19 2359 99 %     Weight 02/19/19 2346 109 lb (49.4 kg)     Height 02/19/19 2346 5'  7" (1.702 m)     Head Circumference --      Peak Flow --      Pain Score 02/19/19 2346 0     Pain Loc --      Pain Edu? --      Excl. in [place]? --     Vital signs reviewed, nursing assessments reviewed.   Constitutional:   Alert and oriented. Non-toxic appearance. Eyes:   Conjunctivae are normal. EOMI. PERRL. ENT      Head:   Normocephalic and atraumatic.      Nose:   No congestion/rhinnorhea.             Neck:   No meningismus. Full ROM. Hematological/Lymphatic/Immunilogical:   No cervical lymphadenopathy. Cardiovascular:   RRR. Symmetric bilateral radial and DP pulses.  No murmurs. Cap refill less than 2 seconds. Respiratory:   Normal respiratory effort without  tachypnea/retractions. Breath sounds are clear and equal bilaterally. No wheezes/rales/rhonchi. Gastrointestinal:   Soft and nontender. Non distended. There is no CVA tenderness.  No rebound, rigidity, or guarding. Musculoskeletal:   Normal range of motion in all extremities. No joint effusions.  No lower extremity tenderness.  No edema. Neurologic:   Normal speech and language.  Motor grossly intact. No acute focal neurologic deficits are appreciated.  Skin:    Skin is warm, dry and intact. No rash noted.  No petechiae, purpura, or bullae.  ____________________________________________    LABS (pertinent positives/negatives) (all labs ordered are listed, but only abnormal results are displayed) Labs Reviewed  CBC - Abnormal; Notable for the following components:      Result Value   WBC 10.7 (*)    Hemoglobin 12.9 (*)    HCT 36.3 (*)    All other components within normal limits  URINALYSIS, COMPLETE (UACMP) WITH MICROSCOPIC - Abnormal; Notable for the following components:   Color, Urine YELLOW (*)    APPearance HAZY (*)    Ketones, ur 20 (*)    Protein, ur 30 (*)    All other components within normal limits  COMPREHENSIVE METABOLIC PANEL  ETHANOL  URINE DRUG SCREEN, QUALITATIVE ([facility] ONLY)   ____________________________________________   EKG    ____________________________________________    RADIOLOGY  No results found.  ____________________________________________   PROCEDURES Procedures  ____________________________________________    CLINICAL IMPRESSION / ASSESSMENT AND PLAN / ED COURSE  Medications ordered in the ED: Medications  ondansetron (ZOFRAN-ODT) disintegrating tablet 8 mg (8 mg Oral Given 02/20/19 0156)  famotidine (PEPCID) tablet 20 mg (20 mg Oral Given 02/20/19 0156)  alum & mag hydroxide-simeth (MAALOX/MYLANTA) 200-200-20 MG/5ML suspension 30 mL (30 mLs Oral Given 02/20/19 0156)    Pertinent labs & imaging results that were available during  my care of the patient were reviewed by me and considered in my medical decision making (see chart for details).  George [name] was evaluated in Emergency Department on 02/20/2019 for the symptoms described in the history of present illness. She was evaluated in the context of the global COVID-19 pandemic, which necessitated consideration that the patient might be at risk for infection with the SARS-CoV-2 virus that causes COVID-19. Institutional protocols and algorithms that pertain to the evaluation of patients at risk for COVID-19 are in a state of rapid change based on information released by regulatory bodies including the CDC and federal and state organizations. These policies and algorithms were followed during the patient's care in the ED.   Patient presents with symptoms of depression.  Currently cooperative, contracts for safety,  eager to see psychiatry.  We will continue to monitor the patient under voluntary status in the ED.  No evidence of acute psychosis.  I think the abdominal pain is due to GERD, which I will treat with Pepcid Maalox and Zofran.Considering the patient's symptoms, medical history, and physical examination today, I have low suspicion for cholecystitis or biliary pathology, pancreatitis, perforation or bowel obstruction, hernia, intra-abdominal abscess, AAA or dissection, volvulus or intussusception, mesenteric ischemia, or appendicitis.    ----------------------------------------- 4:51 AM on 02/20/2019 -----------------------------------------  Psychiatry consult note reviewed, plan for psychiatry admission.       ____________________________________________   FINAL CLINICAL IMPRESSION(S) / ED DIAGNOSES    Final diagnoses:  Suicidal ideation     ED Discharge Orders    None      Portions of this note were generated with dragon dictation software. Dictation errors may occur despite best attempts at proofreading.   Sharman CheekStafford, Keilyn Haggard, MD 02/20/19  Guerry [id number]    Sharman CheekStafford, Darrielle Pflieger, MD 02/20/19 [id number]

## 2019-02-20 NOTE — ED Notes (Signed)
Patient alert and oriented x 4. Patient denies HI and AVH. Patient states she is having SI with multiple plans to kill herself. Patient able to contract for safety with this Probation officer. Patient states she has multiple personalities with a total of 4 including herself. Patient states she inherited two from her dad who is schizophrenic. Patient states mom is also psych unstable. Patient was pleasant when speaking with during assessment. Will continue to monitor.

## 2019-02-20 NOTE — ED Notes (Signed)
BEHAVIORAL HEALTH ROUNDING Patient sleeping: No. Patient alert and oriented: yes Behavior appropriate: Yes.  ; If no, describe:  Nutrition and fluids offered: yes Toileting and hygiene offered: Yes  Sitter present: q15 minute observations and security monitoring Law enforcement present: Yes    

## 2019-02-20 NOTE — ED Provider Notes (Signed)
-----------------------------------------   9:45 AM on 02/20/2019 -----------------------------------------   Blood pressure 95/66, pulse 71, temperature 98.7 F (37.1 C), temperature source Oral, resp. rate 18, height 5\' 7"  (1.702 m), weight 49.4 kg, SpO2 96 %.  The patient is calm and cooperative at this time.  There have been no acute events since the last update.  Awaiting disposition plan from Behavioral Medicine team.   COVID test is negative   Delman Kitten, MD 02/20/19 [phone]

## 2019-02-21 DIAGNOSIS — Z590 Homelessness: Secondary | ICD-10-CM

## 2019-02-21 DIAGNOSIS — F438 Other reactions to severe stress: Secondary | ICD-10-CM

## 2019-02-21 DIAGNOSIS — R45851 Suicidal ideations: Secondary | ICD-10-CM

## 2019-02-21 DIAGNOSIS — F339 Major depressive disorder, recurrent, unspecified: Secondary | ICD-10-CM

## 2019-02-21 MED ORDER — RISPERIDONE 0.5 MG PO TBDP
1.0000 mg | ORAL_TABLET | Freq: Every day | ORAL | Status: DC
  Filled 2019-02-21: qty 1

## 2019-02-21 NOTE — ED Notes (Signed)
Hourly rounding reveals patient sleeping in room. No complaints, stable, in no acute distress. Q15 minute rounds and monitoring via Security Cameras to continue. 

## 2019-02-21 NOTE — ED Notes (Signed)
BEHAVIORAL HEALTH ROUNDING Patient sleeping: No. Patient alert and oriented: yes Behavior appropriate: Yes.  ; If no, describe:  Nutrition and fluids offered: yes Toileting and hygiene offered: Yes  Sitter present: q15 minute observations and security camera monitoring Law enforcement present: Yes  ODS  

## 2019-02-21 NOTE — BH Assessment (Signed)
Patient has been accepted to [facility].  Patient assigned to [place] is Dr. Dareen Piano.  Call report to [phone].  Representative was Roderic Palau (Intake Clinician).   ER Staff is aware of it:  LouAnn, ER Secretary  Dr. Myrene Buddy, ER MD  Tracey/Amy T., Patient's Nurse

## 2019-02-21 NOTE — Consult Note (Signed)
Telepsych Consultation   Reason for Consult:  Suicidal ideation Referring Physician:  EDP Location of Patient:  Location of Provider: [facility]  Patient Identification: George LacksMichael [name] MRN:  [id number] Principal Diagnosis: Suicidal ideation Diagnosis:  Principal Problem:   Suicidal ideation Active Problems:   Depression   Hormone replacement therapy   Anxiety   PTSD (post-traumatic stress disorder)   ADHD   Total Time spent with patient: 30 minutes  Subjective:   George LacksMichael [name] is a 23 y.o. adult patient presented voluntarily to [facility] ED with suicidal thoughts.  HPI: Patient is a 23 year old male who [facility], for complaints of being really stressed out, not being able to find a shelter hence having suicidal thoughts. George [name] feels that life is not worth living and she is tired of being stressed all the time.  George [name] does report that her mother is supportive, but adds that she cannot live there and does not want to talk about it.  Patient states that her struggle has been that she is a sex offender, cannot find a shelter, has tried all places and is now tired of being homeless.  Patient also reports that she has not been taking her medications, struggles with self-esteem as she is unable to get a job, has history of PTSD, ADHD, gender dysphoria and depression.  Discussed restarting patient back on resperidone, patient agreeable with this plan.  Also multiple attempts made by TTS to contact mom to see if patient can live with her as patient suicidal thoughts have gotten to do with being homeless and not having a place to stay.  Discussed the need for medication compliance, outpatient follow-up to stay stable.  Patient also reports that she has filed for disability.  Patient denies any homicidal thoughts, any psychotic symptoms.  Patient reports that her depression is got to do with her not having a job, stable housing.  Past Psychiatric History: Unchanged from the  previous assessment  Risk to Self: Suicidal Ideation: Yes-Currently Present Suicidal Intent: Yes-Currently Present Is patient at risk for suicide?: Yes Suicidal Plan?: Yes-Currently Present Specify Current Suicidal Plan: Choke, stab self, or drown Access to Means: Yes What has been your use of drugs/alcohol within the last 12 months?: denied use How many times?: 20 Other Self Harm Risks: None Triggers for Past Attempts: Other (Comment)("every thing and nothing") Intentional Self Injurious Behavior: None Risk to Others: Homicidal Ideation: No Thoughts of Harm to Others: No Current Homicidal Intent: No Current Homicidal Plan: No Access to Homicidal Means: No Identified Victim: None identified History of harm to others?: No Assessment of Violence: None Noted Violent Behavior Description: denied Does patient have access to weapons?: No Criminal Charges Pending?: No Does patient have a court date: No Prior Inpatient Therapy: Prior Inpatient Therapy: Yes Prior Therapy Dates: 2016 and prior [facility] [address] [facility]  Reason for Treatment: Depression & SI Prior Outpatient Therapy: Prior Outpatient Therapy: Yes Prior Therapy Dates: Pending intake [facility] Reason for Treatment: Depression & PTSD Does patient have an ACCT team?: No Does patient have Intensive In-House Services?  : No Does patient have [facility] services? : No Does patient have P4CC services?: No  Past Medical History:  Past Medical History:  Diagnosis Date  . ADHD   . Anxiety   . Depression   . Gender dysphoria   . PTSD (post-traumatic stress disorder)     Past Surgical History:  Procedure Laterality Date  .  FACIAL FRACTURE SURGERY     Family History: No family history on file. Family Psychiatric  History: unchanged Social History:  Social History   Substance and Sexual Activity   Alcohol Use No     Social History   Substance and Sexual Activity  Drug Use Never    Social History   Socioeconomic History  . Marital status: Single    Spouse name: Not on file  . Number of children: Not on file  . Years of education: Not on file  . Highest education level: Not on file  Occupational History  . Not on file  Social Needs  . Financial resource strain: Not on file  . Food insecurity    Worry: Not on file    Inability: Not on file  . Transportation needs    Medical: Not on file    Non-medical: Not on file  Tobacco Use  . Smoking status: Never Smoker  . Smokeless tobacco: Never Used  Substance and Sexual Activity  . Alcohol use: No  . Drug use: Never  . Sexual activity: Not on file  Lifestyle  . Physical activity    Days per week: Not on file    Minutes per session: Not on file  . Stress: Not on file  Relationships  . Social Herbalist on phone: Not on file    Gets together: Not on file    Attends religious service: Not on file    Active member of club or organization: Not on file    Attends meetings of clubs or organizations: Not on file    Relationship status: Not on file  Other Topics Concern  . Not on file  Social History Narrative  . Not on file   Additional Social History:    Allergies:   Allergies  Allergen Reactions  . Lithium   . Tegretol [Carbamazepine]     Labs:  Results for orders placed or performed during the hospital encounter of 02/20/19 (from the past 48 hour(s))  CBC     Status: Abnormal   Collection Time: 02/20/19 12:14 AM  Result Value Ref Range   WBC 10.7 (H) 4.0 - 10.5 K/uL   RBC 4.27 4.22 - 5.81 MIL/uL   Hemoglobin 12.9 (L) 13.0 - 17.0 g/dL   HCT 36.3 (L) 39.0 - 52.0 %   MCV 85.0 80.0 - 100.0 fL   MCH 30.2 26.0 - 34.0 pg   MCHC 35.5 30.0 - 36.0 g/dL   RDW 12.2 11.5 - 15.5 %   Platelets 260 150 - 400 K/uL   nRBC 0.0 0.0 - 0.2 %    Comment: Performed at [facility], [address]  [place], [place] [address]  Comprehensive metabolic panel     Status: None   Collection Time: 02/20/19 12:14 AM  Result Value Ref Range   Sodium 137 135 - 145 mmol/L   Potassium 4.1 3.5 - 5.1 mmol/L   Chloride 104 98 - 111 mmol/L   CO2 24 22 - 32 mmol/L   Glucose, Bld 98 70 - 99 mg/dL   BUN 20 6 - 20 mg/dL   Creatinine, Ser 0.79 0.61 - 1.24 mg/dL   Calcium 9.0 8.9 - 10.3 mg/dL   Total Protein 7.1 6.5 - 8.1 g/dL   Albumin 4.7 3.5 - 5.0 g/dL   AST 16 15 - 41 U/L   ALT 13 0 - 44 U/L   Alkaline Phosphatase 46 38 - 126 U/L   Total  Bilirubin 0.5 0.3 - 1.2 mg/dL   GFR calc non Af Amer >60 >60 mL/min   GFR calc Af Amer >60 >60 mL/min   Anion gap 9 5 - 15    Comment: Performed at [facility], [address] [place], [place] [address]  Ethanol     Status: None   Collection Time: 02/20/19 12:14 AM  Result Value Ref Range   Alcohol, Ethyl (B) <10 <10 mg/dL    Comment: (NOTE) Lowest detectable limit for serum alcohol is 10 mg/dL. For medical purposes only. Performed at [facility], [address] [place], [place] [address]   Urinalysis, Complete w Microscopic     Status: Abnormal   Collection Time: 02/20/19 12:14 AM  Result Value Ref Range   Color, Urine YELLOW (A) YELLOW   APPearance HAZY (A) CLEAR   Specific Gravity, Urine 1.027 1.005 - 1.030   pH 5.0 5.0 - 8.0   Glucose, UA NEGATIVE NEGATIVE mg/dL   Hgb urine dipstick NEGATIVE NEGATIVE   Bilirubin Urine NEGATIVE NEGATIVE   Ketones, ur 20 (A) NEGATIVE mg/dL   Protein, ur 30 (A) NEGATIVE mg/dL   Nitrite NEGATIVE NEGATIVE   Leukocytes,Ua NEGATIVE NEGATIVE   RBC / HPF 0-5 0 - 5 RBC/hpf   WBC, UA 0-5 0 - 5 WBC/hpf   Bacteria, UA NONE SEEN NONE SEEN   Squamous Epithelial / LPF 0-5 0 - 5   Mucus PRESENT    Hyaline Casts, UA PRESENT     Comment: Performed at [facility], [address] [place], [place] [address]  Urine Drug Screen, Qualitative ([facility] only)     Status: None   Collection Time: 02/20/19  12:14 AM  Result Value Ref Range   Tricyclic, Ur Screen NONE DETECTED NONE DETECTED   Amphetamines, Ur Screen NONE DETECTED NONE DETECTED   MDMA (Ecstasy)Ur Screen NONE DETECTED NONE DETECTED   Cocaine Metabolite,Ur [place] NONE DETECTED NONE DETECTED   Opiate, Ur Screen NONE DETECTED NONE DETECTED   Phencyclidine (PCP) Ur S NONE DETECTED NONE DETECTED   Cannabinoid 50 Ng, Ur [place] NONE DETECTED NONE DETECTED   Barbiturates, Ur Screen NONE DETECTED NONE DETECTED   Benzodiazepine, Ur Scrn NONE DETECTED NONE DETECTED   Methadone Scn, Ur NONE DETECTED NONE DETECTED    Comment: (NOTE) Tricyclics + metabolites, urine    Cutoff 1000 ng/mL Amphetamines + metabolites, urine  Cutoff 1000 ng/mL MDMA (Ecstasy), urine              Cutoff 500 ng/mL Cocaine Metabolite, urine          Cutoff 300 ng/mL Opiate + metabolites, urine        Cutoff 300 ng/mL Phencyclidine (PCP), urine         Cutoff 25 ng/mL Cannabinoid, urine                 Cutoff 50 ng/mL Barbiturates + metabolites, urine  Cutoff 200 ng/mL Benzodiazepine, urine              Cutoff 200 ng/mL Methadone, urine                   Cutoff 300 ng/mL The urine drug screen provides only a preliminary, unconfirmed analytical test result and should not be used for non-medical purposes. Clinical consideration and professional judgment should be applied to any positive drug screen result due to possible interfering substances. A more specific alternate chemical method must be used in order to obtain a confirmed analytical  result. Gas chromatography / mass spectrometry (GC/MS) is the preferred confirmat ory method. Performed at [facility], [address] [place], [place] [address]   SARS Coronavirus 2 (CEPHEID - Performed in [facility] hospital lab), Hosp Order     Status: None   Collection Time: 02/20/19  5:24 AM   Specimen: Nasopharyngeal Swab  Result Value Ref Range   SARS Coronavirus 2 NEGATIVE NEGATIVE    Comment: (NOTE) If result  is NEGATIVE SARS-CoV-2 target nucleic acids are NOT DETECTED. The SARS-CoV-2 RNA is generally detectable in upper and lower  respiratory specimens during the acute phase of infection. The lowest  concentration of SARS-CoV-2 viral copies this assay can detect is 250  copies / mL. A negative result does not preclude SARS-CoV-2 infection  and should not be used as the sole basis for treatment or other  patient management decisions.  A negative result may occur with  improper specimen collection / handling, submission of specimen other  than nasopharyngeal swab, presence of viral mutation(s) within the  areas targeted by this assay, and inadequate number of viral copies  (<250 copies / mL). A negative result must be combined with clinical  observations, patient history, and epidemiological information. If result is POSITIVE SARS-CoV-2 target nucleic acids are DETECTED. The SARS-CoV-2 RNA is generally detectable in upper and lower  respiratory specimens dur ing the acute phase of infection.  Positive  results are indicative of active infection with SARS-CoV-2.  Clinical  correlation with patient history and other diagnostic information is  necessary to determine patient infection status.  Positive results do  not rule out bacterial infection or co-infection with other viruses. If result is PRESUMPTIVE POSTIVE SARS-CoV-2 nucleic acids MAY BE PRESENT.   A presumptive positive result was obtained on the submitted specimen  and confirmed on repeat testing.  While 2019 novel coronavirus  (SARS-CoV-2) nucleic acids may be present in the submitted sample  additional confirmatory testing may be necessary for epidemiological  and / or clinical management purposes  to differentiate between  SARS-CoV-2 and other Sarbecovirus currently known to infect humans.  If clinically indicated additional testing with an alternate test  methodology [phone]) is advised. The SARS-CoV-2 RNA is generally   detectable in upper and lower respiratory sp ecimens during the acute  phase of infection. The expected result is Negative. Fact Sheet for Patients:  BoilerBrush.com.cy Fact Sheet for Healthcare Providers: https://pope.com/ This test is not yet approved or cleared by the [place] FDA and has been authorized for detection and/or diagnosis of SARS-CoV-2 by FDA under an Emergency Use Authorization (EUA).  This EUA will remain in effect (meaning this test can be used) for the duration of the COVID-19 declaration under Section 564(b)(1) of the Act, 21 U.S.C. section 360bbb-3(b)(1), unless the authorization is terminated or revoked sooner. Performed at [facility], [address] [place], [place] [address]     Medications:  Current Facility-Administered Medications  Medication Dose Route Frequency Provider Last Rate Last Dose  . risperiDONE (RISPERDAL M-TABS) disintegrating tablet 1 mg  1 mg Oral QHS Nelly Rout, MD      . traZODone (DESYREL) tablet 50 mg  50 mg Oral QHS Catalina Gravel, NP   50 mg at 02/20/19 2208   Current Outpatient Medications  Medication Sig Dispense Refill  . estradiol (ESTRACE) 1 MG tablet Take 3 tablets (3 mg total) by mouth 2 (two) times a day. Documentation only (Patient taking differently: Take 2-3 mg by mouth 2 (two) times  a day. Takes 2 mg in the morning, 3 mg at bedtime) 180 tablet   . FLUoxetine (PROZAC) 20 MG capsule Take 1 capsule (20 mg total) by mouth daily. 30 capsule 2  . risperiDONE (RISPERDAL) 2 MG tablet Take 0.5 tablets (1 mg total) by mouth at bedtime. (Patient taking differently: Take 2 mg by mouth at bedtime. ) 30 tablet 2  . spironolactone (ALDACTONE) 50 MG tablet Take 100 mg by mouth 2 (two) times daily.       Psychiatric Specialty Exam: Physical Exam  ROS  Blood pressure 101/66, pulse 84, temperature 98 F (36.7 C), temperature source Oral, resp. rate 15, height 5'  7" (1.702 m), weight 49.4 kg, SpO2 99 %.Body mass index is 17.07 kg/m.  General Appearance: Casual  Eye Contact:  Fair  Speech:  Clear and Coherent and Normal Rate  Volume:  Normal  Mood:  Depressed and Dysphoric  Affect:  Non-Congruent and Full Range  Thought Process:  Coherent, Goal Directed and Descriptions of Associations: Intact  Orientation:  Full (Time, Place, and Person)  Thought Content:  Logical and Rumination  Suicidal Thoughts:  Yes.  with intent/plan  Homicidal Thoughts:  No  Memory:  Immediate;   Fair Recent;   Fair Remote;   Fair  Judgement:  Impaired  Insight:  Shallow  Psychomotor Activity:  Normal  Concentration:  Concentration: Fair and Attention Span: Fair  Recall:  [employer]Fair  Fund of Knowledge:  Fair  Language:  Fair  Akathisia:  No  Handed:  Right  AIMS (if indicated):     Assets:  Desire for Improvement Physical Health Social Support  ADL's:  Intact  Cognition:  WNL  Sleep:        Treatment Plan Summary: Plan Multiple attempts made to try and contact mom, as patient's suicidality is to do with homelessness, discussed with patient in length community supports, outpatient follow-up and safety planning  Disposition: Recommend psychiatric Inpatient admission when medically cleared. Discussed crisis plan, support from social network, calling 911, coming to the Emergency Department, and calling Suicide Hotline. Patient is recommended for inpatient psychiatric care as he refuses to contract for safety, trying to get in touch with mother so safety planning can be done in order for patient to be discharged from the ED. Patient is restarted on Risperdal M tab 1 mg at bedtime for mood stabilization.  Order placed in patient's chart.  Will contact EDP and discuss disposition This service was provided via telemedicine using a 2-way, interactive audio and video technology.  Names of all persons participating in this telemedicine service and their role in this  encounter. Name: Patient Role: [facility]  Name: Nelly Routrchana Elisa Kutner Role: Psychiatrist   Nelly RoutArchana Jeanetta Alonzo, MD 02/21/2019 10:51 AM

## 2019-02-21 NOTE — ED Provider Notes (Signed)
-----------------------------------------   6:25 AM on 02/21/2019 -----------------------------------------   Blood pressure 111/75, pulse 96, temperature 98.1 F (36.7 C), temperature source Oral, resp. rate 16, height 1.702 m (5\' 7" ), weight 49.4 kg, SpO2 99 %.  The patient is calm and cooperative at this time.  There have been no acute events since the last update.  Awaiting disposition plan from Behavioral Medicine team.   Hinda Kehr, MD 02/21/19 [phone]

## 2019-02-21 NOTE — ED Notes (Signed)
Pt given meal tray and a sprite.  

## 2019-02-21 NOTE — ED Notes (Signed)

## 2019-02-21 NOTE — ED Notes (Signed)
Patient observed lying in bed with eyes closed  Even, unlabored respirations observed   NAD pt appears to be sleeping  I will continue to monitor along with every 15 minute visual observations and ongoing security camera monitoring    

## 2019-02-21 NOTE — ED Notes (Signed)
Pt given ginger ale, peanut butter and graham crackers.

## 2019-02-21 NOTE — ED Notes (Signed)
Called [facility] for transport to [facility]

## 2019-02-21 NOTE — ED Notes (Signed)
ED BHU Pace Is the patient under IVC or is there intent for IVC: Yes.   Is the patient medically cleared: Yes.   Is there vacancy in the ED BHU: Yes.   Is the population mix appropriate for patient: Yes.   Is the patient awaiting placement in inpatient or outpatient setting: Yes.   Has the patient had a psychiatric consult: Yes.   Survey of unit performed for contraband, proper placement and condition of furniture, tampering with fixtures in bathroom, shower, and each patient room: Yes.  ; Findings:  APPEARANCE/BEHAVIOR Calm and cooperative NEURO ASSESSMENT Orientation: oriented x3  Denies pain Hallucinations: No.None noted (Hallucinations)  denies Speech: Normal Gait: normal RESPIRATORY ASSESSMENT Even  Unlabored respirations  CARDIOVASCULAR ASSESSMENT Pulses equal   regular rate  Skin warm and dry   GASTROINTESTINAL ASSESSMENT no GI complaint EXTREMITIES Full ROM  PLAN OF CARE Provide calm/safe environment. Vital signs assessed twice daily. ED BHU Assessment once each 12-hour shift. Collaborate with TTS if available   Assure the ED provider has rounded once each shift. Provide and encourage hygiene. Provide redirection as needed. Assess for escalating behavior; address immediately and inform ED provider.  Assess family dynamic and appropriateness for visitation as needed: Yes.  ; If necessary, describe findings:  Educate the patient/family about BHU procedures/visitation: Yes.  ; If necessary, describe findings:

## 2019-02-21 NOTE — ED Notes (Signed)
Pt given supplies to take a shower.  

## 2019-07-18 ENCOUNTER — Other Ambulatory Visit: Payer: Self-pay

## 2019-07-18 ENCOUNTER — Encounter: Payer: Self-pay | Admitting: Emergency Medicine

## 2019-07-18 ENCOUNTER — Emergency Department: Payer: Medicaid Other

## 2019-07-18 ENCOUNTER — Emergency Department
Admission: EM | Admit: 2019-07-18 | Discharge: 2019-07-18 | Disposition: A | Discharge status: Home / Self Care | Payer: Medicaid Other | Attending: Emergency Medicine | Admitting: Emergency Medicine

## 2019-07-18 DIAGNOSIS — R110 Nausea: Secondary | ICD-10-CM | POA: Insufficient documentation

## 2019-07-18 DIAGNOSIS — U071 COVID-19: Secondary | ICD-10-CM | POA: Insufficient documentation

## 2019-07-18 DIAGNOSIS — Z79899 Other long term (current) drug therapy: Secondary | ICD-10-CM | POA: Insufficient documentation

## 2019-07-18 LAB — POC SARS CORONAVIRUS 2 AG: SARS Coronavirus 2 Ag: POSITIVE — AB

## 2019-07-18 NOTE — ED Triage Notes (Signed)
Wants covid test--went to testing site at visitor entrance, but was told they couldn't see t hem because he is not in a car.

## 2019-07-18 NOTE — ED Triage Notes (Signed)
Cough and low fever started yesterday. Appears in no distress. Here for covid test but unable to test at visitor site entrance because he is not in a car, took the bus here.

## 2019-07-18 NOTE — ED Provider Notes (Signed)
[facility] Emergency Department Provider Note  ____________________________________________   First MD Initiated Contact with Patient 07/18/19 1402     (approximate)  I have reviewed the triage vital signs and the nursing notes.   HISTORY  Chief Complaint Cough    HPI George [name] is a 23 y.o. adult presents emergency department with concerns of Covid.  States he has a friend that is positive for Covid.  He rode the link bus here and try to get the drive-through line but they would not test him as he was not in a car.  Therefore he came to the ED.  He denies any chest pain or shortness of breath.  Has mild cough.  Some nausea but no vomiting.  Remainder of review of systems is negative    Past Medical History:  Diagnosis Date  . ADHD   . Anxiety   . Depression   . Gender dysphoria   . PTSD (post-traumatic stress disorder)     Patient Active Problem List   Diagnosis Date Noted  . Depression 01/29/2019  . Hormone replacement therapy 01/29/2019  . Suicidal ideation 01/29/2019  . Anxiety 01/29/2019  . PTSD (post-traumatic stress disorder) 01/29/2019  . ADHD 01/29/2019    Past Surgical History:  Procedure Laterality Date  . FACIAL FRACTURE SURGERY      Prior to Admission medications   Medication Sig Start Date End Date Taking? Authorizing Provider  estradiol (ESTRACE) 1 MG tablet Take 3 tablets (3 mg total) by mouth 2 (two) times a day. Documentation only Patient taking differently: Take 2-3 mg by mouth 2 (two) times a day. Takes 2 mg in the morning, 3 mg at bedtime 01/30/19   Lavella Hammock, MD  FLUoxetine (PROZAC) 20 MG capsule Take 1 capsule (20 mg total) by mouth daily. 01/31/19   Lavella Hammock, MD  risperiDONE (RISPERDAL) 2 MG tablet Take 0.5 tablets (1 mg total) by mouth at bedtime. Patient taking differently: Take 2 mg by mouth at bedtime.  01/30/19   Lavella Hammock, MD  spironolactone (ALDACTONE) 50 MG tablet Take 100 mg by mouth 2  (two) times daily.    [provider]    Allergies Lithium and Tegretol [carbamazepine]  No family history on file.  Social History Social History   Tobacco Use  . Smoking status: Never Smoker  . Smokeless tobacco: Never Used  Substance Use Topics  . Alcohol use: No  . Drug use: Never    Review of Systems  Constitutional: Positive fever/chills Eyes: No visual changes. ENT: No sore throat. Respiratory: positive cough Genitourinary: Negative for dysuria. Musculoskeletal: Negative for back pain. Skin: Negative for rash.    ____________________________________________   PHYSICAL EXAM:  VITAL SIGNS: ED Triage Vitals  Enc Vitals Group     BP 07/18/19 1352 120/82     Pulse Rate 07/18/19 1352 (!) 105     Resp 07/18/19 1352 16     Temp 07/18/19 1352 (!) 100.7 F (38.2 C)     Temp Source 07/18/19 1352 Oral     SpO2 07/18/19 1352 100 %     Weight 07/18/19 1353 112 lb (50.8 kg)     Height 07/18/19 1353 5\' 8"  (1.727 m)     Head Circumference --      Peak Flow --      Pain Score 07/18/19 1357 7     Pain Loc --      Pain Edu? --      Excl.  in [place]? --     Constitutional: Alert and oriented. Well appearing and in no acute distress. Eyes: Conjunctivae are normal.  Head: Atraumatic. Nose: No congestion/rhinnorhea. Mouth/Throat: Mucous membranes are moist.   Neck:  supple no lymphadenopathy noted Cardiovascular: Normal rate, regular rhythm. Heart sounds are normal Respiratory: Normal respiratory effort.  No retractions, lungs c t a  GU: deferred Musculoskeletal: FROM all extremities, warm and well perfused Neurologic:  Normal speech and language.  Skin:  Skin is warm, dry and intact. No rash noted. Psychiatric: Mood and affect are normal. Speech and behavior are normal.  ____________________________________________   LABS (all labs ordered are listed, but only abnormal results are displayed)  Labs Reviewed  POC SARS CORONAVIRUS 2 AG -  ED    ____________________________________________   ____________________________________________  RADIOLOGY  Chest x-ray is negative  ____________________________________________   PROCEDURES  Procedure(s) performed: No  Procedures    ____________________________________________   INITIAL IMPRESSION / ASSESSMENT AND PLAN / ED COURSE  Pertinent labs & imaging results that were available during my care of the patient were reviewed by me and considered in my medical decision making (see chart for details).   Patient is 23 year old male presents emergency department with complaints of Covid symptoms.  Physical exam patient appears well.  He is febrile and tachycardic.  Chest x-ray is negative Rapid antigen Covid is positive  Explained the findings to the patient.  He was discharged in stable condition.  He was instructed to quarantine for an 10 to 14 days.  Return emergency department if worsening.  States he understands.  Is discharged stable condition.    George [name] was evaluated in Emergency Department on 07/18/2019 for the symptoms described in the history of present illness. She was evaluated in the context of the global COVID-19 pandemic, which necessitated consideration that the patient might be at risk for infection with the SARS-CoV-2 virus that causes COVID-19. Institutional protocols and algorithms that pertain to the evaluation of patients at risk for COVID-19 are in a state of rapid change based on information released by regulatory bodies including the CDC and federal and state organizations. These policies and algorithms were followed during the patient's care in the ED.   As part of my medical decision making, I reviewed the following data within the electronic MEDICAL RECORD NUMBER Nursing notes reviewed and incorporated, Labs reviewed positive Covid antigen, Old chart reviewed, Radiograph reviewed chest x-ray negative, Notes from prior ED visits and [place] Controlled  Substance Database  ____________________________________________   FINAL CLINICAL IMPRESSION(S) / ED DIAGNOSES  Final diagnoses:  COVID-19      NEW MEDICATIONS STARTED DURING THIS VISIT:  New Prescriptions   No medications on file     Note:  This document was prepared using Dragon voice recognition software and may include unintentional dictation errors.    Faythe Ghee, PA-C 07/18/19 1449    Arnaldo Natal, MD 07/19/19 1535

## 2019-07-18 NOTE — Discharge Instructions (Addendum)
Follow-up with your regular doctor if not better in 3 days.  Return emergency department worsening.  Take Tylenol for fever if needed.  Over-the-counter Mucinex to help with cough

## 2019-08-02 ENCOUNTER — Telehealth: Payer: Self-pay | Admitting: Pharmacy Technician

## 2019-08-02 NOTE — Telephone Encounter (Signed)
Patient failed to provide 2020 financial documentation.  No additional medication assistance will be provided by [facility] without the required proof of income documentation.  Patient notified by letter.  Jameson Tormey J. Eryc Bodey Care Manager [facility] 

## 2019-08-15 ENCOUNTER — Telehealth: Payer: Self-pay | Admitting: Pharmacy Technician

## 2019-08-15 NOTE — Telephone Encounter (Signed)
Patient failed to provide requested 2020 financial documentation.  No additional medication assistance will be provided by [facility] without the required proof of income documentation.  Patient notified by letter.  Alec Mcphee J. Jimma Ortman Care Manager [facility] 
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
## 2020-11-20 ENCOUNTER — Emergency Department: Payer: BLUE CROSS/BLUE SHIELD

## 2020-11-20 ENCOUNTER — Other Ambulatory Visit: Payer: Self-pay

## 2020-11-20 DIAGNOSIS — R0602 Shortness of breath: Secondary | ICD-10-CM | POA: Diagnosis not present

## 2020-11-20 DIAGNOSIS — R0789 Other chest pain: Secondary | ICD-10-CM | POA: Insufficient documentation

## 2020-11-20 DIAGNOSIS — R000 Tachycardia, unspecified: Secondary | ICD-10-CM | POA: Diagnosis not present

## 2020-11-20 DIAGNOSIS — R55 Syncope and collapse: Secondary | ICD-10-CM | POA: Diagnosis not present

## 2020-11-20 DIAGNOSIS — E860 Dehydration: Secondary | ICD-10-CM | POA: Insufficient documentation

## 2020-11-20 LAB — CBC
HCT: 38.5 % — ABNORMAL LOW (ref 39.0–52.0)
Hemoglobin: 13.6 g/dL (ref 13.0–17.0)
MCH: 30.4 pg (ref 26.0–34.0)
MCHC: 35.3 g/dL (ref 30.0–36.0)
MCV: 85.9 fL (ref 80.0–100.0)
Platelets: 253 10*3/uL (ref 150–400)
RBC: 4.48 MIL/uL (ref 4.22–5.81)
RDW: 12.6 % (ref 11.5–15.5)
WBC: 21.8 10*3/uL — ABNORMAL HIGH (ref 4.0–10.5)
nRBC: 0 % (ref 0.0–0.2)

## 2020-11-20 NOTE — ED Triage Notes (Addendum)
Pt states around 4pm today he started to have chest pain and shortness of breath. Pt was able to check heart rate on apple watch and was reading in the 140s after walking short distances. Pt able to speak in complete sentences in triage.

## 2020-11-21 ENCOUNTER — Emergency Department
Admission: EM | Admit: 2020-11-21 | Discharge: 2020-11-21 | Disposition: A | Discharge status: Home / Self Care | Payer: BLUE CROSS/BLUE SHIELD | Attending: Emergency Medicine | Admitting: Emergency Medicine

## 2020-11-21 DIAGNOSIS — R000 Tachycardia, unspecified: Secondary | ICD-10-CM

## 2020-11-21 DIAGNOSIS — E860 Dehydration: Secondary | ICD-10-CM

## 2020-11-21 LAB — URINE DRUG SCREEN, QUALITATIVE (ARMC ONLY)
Amphetamines, Ur Screen: NOT DETECTED
Barbiturates, Ur Screen: NOT DETECTED
Benzodiazepine, Ur Scrn: NOT DETECTED
Cannabinoid 50 Ng, Ur ~~LOC~~: NOT DETECTED
Cocaine Metabolite,Ur ~~LOC~~: NOT DETECTED
MDMA (Ecstasy)Ur Screen: NOT DETECTED
Methadone Scn, Ur: NOT DETECTED
Opiate, Ur Screen: NOT DETECTED
Phencyclidine (PCP) Ur S: NOT DETECTED
Tricyclic, Ur Screen: NOT DETECTED

## 2020-11-21 LAB — BASIC METABOLIC PANEL
Anion gap: 10 (ref 5–15)
BUN: 9 mg/dL (ref 6–20)
CO2: 24 mmol/L (ref 22–32)
Calcium: 8.3 mg/dL — ABNORMAL LOW (ref 8.9–10.3)
Chloride: 100 mmol/L (ref 98–111)
Creatinine, Ser: 0.61 mg/dL (ref 0.61–1.24)
GFR, Estimated: >60 mL/min (ref >60)
Glucose, Bld: 119 mg/dL — ABNORMAL HIGH (ref 70–99)
Potassium: 4.1 mmol/L (ref 3.5–5.1)
Sodium: 134 mmol/L — ABNORMAL LOW (ref 135–145)

## 2020-11-21 LAB — URINALYSIS, COMPLETE (UACMP) WITH MICROSCOPIC
Bacteria, UA: NONE SEEN
Bilirubin Urine: NEGATIVE
Glucose, UA: NEGATIVE mg/dL
Hgb urine dipstick: NEGATIVE
Ketones, ur: 5 mg/dL — AB
Leukocytes,Ua: NEGATIVE
Nitrite: NEGATIVE
Protein, ur: NEGATIVE mg/dL
Specific Gravity, Urine: 1.025 (ref 1.005–1.030)
pH: 5 (ref 5.0–8.0)

## 2020-11-21 LAB — T4, FREE: Free T4: 0.7 ng/dL (ref 0.61–1.12)

## 2020-11-21 LAB — TSH: TSH: 4.165 u[IU]/mL (ref 0.350–4.500)

## 2020-11-21 LAB — D-DIMER, QUANTITATIVE: D-Dimer, Quant: <0.27 ug/mL-FEU (ref 0.00–0.50)

## 2020-11-21 LAB — TROPONIN I (HIGH SENSITIVITY)
Troponin I (High Sensitivity): 4 ng/L (ref <18)
Troponin I (High Sensitivity): 4 ng/L (ref <18)

## 2020-11-21 MED ORDER — LACTATED RINGERS IV BOLUS
1000.0000 mL | Freq: Once | INTRAVENOUS | Status: AC
  Administered 2020-11-21: 1000 mL via INTRAVENOUS

## 2020-11-21 NOTE — ED Provider Notes (Signed)
[facility] Emergency Department Provider Note  ____________________________________________  Time seen: Approximately 2:23 AM  I have reviewed the triage vital signs and the nursing notes.   HISTORY  Chief Complaint Shortness of Breath   HPI George [name] is a 25 y.o. adult male to male transgender with a history of PTSD, depression, and is anxiety, ADHD who presents for evaluation of tachycardia.  Patient reports that she wears a smart watch and her heart rate has been  as high as 140 today.  Every time her heart rate goes that high she feels tightness in her chest and slight difficulty breathing.  She reports that she has been having these episodes for a very long time which she attributes to her medications but her heart rate has never been as high as it was today which prompted her visit to the emergency room.  She does report donating plasma 2 days ago and having a syncopal event after plasma donation.  She denies fever, cough, personal or family history of PE or DVT but she is on estrogen injections.  She denies leg pain or swelling, hemoptysis, recent travel or immobilization.  Past Medical History:  Diagnosis Date  . ADHD   . Anxiety   . Depression   . Gender dysphoria   . PTSD (post-traumatic stress disorder)     Patient Active Problem List   Diagnosis Date Noted  . Depression 01/29/2019  . Hormone replacement therapy 01/29/2019  . Suicidal ideation 01/29/2019  . Anxiety 01/29/2019  . PTSD (post-traumatic stress disorder) 01/29/2019  . ADHD 01/29/2019    Past Surgical History:  Procedure Laterality Date  . FACIAL FRACTURE SURGERY      Prior to Admission medications   Medication Sig Start Date End Date Taking? Authorizing Provider  estradiol (ESTRACE) 1 MG tablet Take 3 tablets (3 mg total) by mouth 2 (two) times a day. Documentation only Patient taking differently: Take 2-3 mg by mouth 2 (two) times a day. Takes 2 mg in the morning, 3  mg at bedtime 01/30/19   Mariel Craft, MD  FLUoxetine (PROZAC) 20 MG capsule Take 1 capsule (20 mg total) by mouth daily. 01/31/19   Mariel Craft, MD  risperiDONE (RISPERDAL) 2 MG tablet Take 0.5 tablets (1 mg total) by mouth at bedtime. Patient taking differently: Take 2 mg by mouth at bedtime.  01/30/19   Mariel Craft, MD  spironolactone (ALDACTONE) 50 MG tablet Take 100 mg by mouth 2 (two) times daily.    [provider]    Allergies Lithium and Tegretol [carbamazepine]  No family history on file.  Social History Social History   Tobacco Use  . Smoking status: Never Smoker  . Smokeless tobacco: Never Used  Substance Use Topics  . Alcohol use: No  . Drug use: Never    Review of Systems  Constitutional: Negative for fever. Eyes: Negative for visual changes. ENT: Negative for sore throat. Neck: No neck pain  Cardiovascular: Negative for chest pain. + Tachycardia chest Respiratory: + shortness of breath. Gastrointestinal: Negative for abdominal pain, vomiting or diarrhea. Genitourinary: Negative for dysuria. Musculoskeletal: Negative for back pain. Skin: Negative for rash. Neurological: Negative for headaches, weakness or numbness. Psych: No SI or HI  ____________________________________________   PHYSICAL EXAM:  VITAL SIGNS: ED Triage Vitals  Enc Vitals Group     BP 11/20/20 2238 117/77     Pulse Rate 11/20/20 2238 99     Resp 11/20/20 2238 18  Temp 11/20/20 2238 98.7 F (37.1 C)     Temp Source 11/20/20 2238 Oral     SpO2 11/20/20 2238 98 %     Weight 11/20/20 2235 120 lb (54.4 kg)     Height 11/20/20 2235 5\' 8"  (1.727 m)     Head Circumference --      Peak Flow --      Pain Score 11/20/20 2234 7     Pain Loc --      Pain Edu? --      Excl. in GC? --     Constitutional: Alert and oriented. Well appearing and in no apparent distress. HEENT:      Head: Normocephalic and atraumatic.         Eyes: Conjunctivae are normal. Sclera is  non-icteric.       Mouth/Throat: Mucous membranes are moist.       Neck: Supple with no signs of meningismus. Cardiovascular: Regular rate and rhythm. No murmurs, gallops, or rubs. 2+ symmetrical distal pulses are present in all extremities. No JVD. Respiratory: Normal respiratory effort. Lungs are clear to auscultation bilaterally.  Gastrointestinal: Soft, non tender, and non distended with positive bowel sounds. No rebound or guarding. Genitourinary: No CVA tenderness. Musculoskeletal:  No edema, cyanosis, or erythema of extremities. Neurologic: Normal speech and language. Face is symmetric. Moving all extremities. No gross focal neurologic deficits are appreciated. Skin: Skin is warm, dry and intact. No rash noted. Psychiatric: Mood and affect are normal. Speech and behavior are normal.  ____________________________________________   LABS (all labs ordered are listed, but only abnormal results are displayed)  Labs Reviewed  BASIC METABOLIC PANEL - Abnormal; Notable for the following components:      Result Value   Sodium 134 (*)    Glucose, Bld 119 (*)    Calcium 8.3 (*)    All other components within normal limits  CBC - Abnormal; Notable for the following components:   WBC 21.8 (*)    HCT 38.5 (*)    All other components within normal limits  URINALYSIS, COMPLETE (UACMP) WITH MICROSCOPIC - Abnormal; Notable for the following components:   Color, Urine YELLOW (*)    APPearance CLEAR (*)    Ketones, ur 5 (*)    All other components within normal limits  URINE DRUG SCREEN, QUALITATIVE ([facility] ONLY)  D-DIMER, QUANTITATIVE  TSH  T4, FREE  TROPONIN I (HIGH SENSITIVITY)  TROPONIN I (HIGH SENSITIVITY)   ____________________________________________  EKG  ED ECG REPORT I, 2235, the attending physician, personally viewed and interpreted this ECG.  Normal sinus rhythm, rate of 97, normal intervals, normal axis, no ST elevations or depressions.  Normal  EKG. ____________________________________________  RADIOLOGY  I have personally reviewed the images performed during this visit and I agree with the Radiologist's read.   Interpretation by Radiologist:  DG Chest 2 View  Result Date: 11/20/2020 CLINICAL DATA:  Chest pain EXAM: CHEST - 2 VIEW COMPARISON:  None. FINDINGS: The heart size and mediastinal contours are within normal limits. Both lungs are clear. The visualized skeletal structures are unremarkable. IMPRESSION: No active cardiopulmonary disease. Electronically Signed   By: 11/22/2020 MD   On: 11/20/2020 23:00     ____________________________________________   PROCEDURES  Procedure(s) performed:yes .1-3 Lead EKG Interpretation Performed by: 11/22/2020, MD Authorized by: Nita Sickle, MD     Interpretation: normal     ECG rate assessment: normal     Rhythm: sinus rhythm     Ectopy: none  Conduction: normal     Critical Care performed:  None ____________________________________________   INITIAL IMPRESSION / ASSESSMENT AND PLAN / ED COURSE  25 y.o. adult male to male transgender with a history of PTSD, depression, and is anxiety, ADHD who presents for evaluation of tachycardia associated with chest tightness and shortness of breath.  These episodes are short-lived and resolve without intervention.  She notices them because of her smart watch.  Patient is extremely well-appearing in no distress with normal vital signs.  Heart regular rate and rhythm with no murmurs, no asymmetric leg swelling or pitting edema, lungs are clear to auscultation.  Patient has been having these episodes for very long time and it is attributed to her medications.  Ddx cardiac dysrhythmias versus dehydration in the setting of recently donating plasma, thyroid disease, anemia, pericarditis versus myocarditis  We will place patient on telemetry for close monitor for any signs of cardiac dysrhythmias.  EKG with no signs of  dysrhythmias or ischemia or pericarditis.  Troponin x2 - ruling out myocarditis.  UA with mild ketones consistent with mild dehydration.  That can explain also why patient has an elevated white count of 21.8.  Possibly hemoconcentration in the setting of plasma donation.  She denies any fever or infectious etiology.  Her chest x-ray shows no signs of pneumonia or viral illness, visualized by me and confirmed by radiology.  D-dimer was done since patient is on estrogen injections and that is negative.  Thyroid studies are pending.  Drug screen is negative.  Will give IV fluids and monitor on telemetry for any signs of dysrhythmias.  If monitoring is normal will refer to cardiology for Holter monitoring   _________________________ 2:56 AM on 11/21/2020 -----------------------------------------  No signs of dysrhythmias on telemetry.  Will discharge patient with follow-up with cardiology.  Discussed rehydration at home and also follow-up with PCP.  Discussed my standard return precautions       _____________________________________________ Please note:  Patient was evaluated in Emergency Department today for the symptoms described in the history of present illness. Patient was evaluated in the context of the global COVID-19 pandemic, which necessitated consideration that the patient might be at risk for infection with the SARS-CoV-2 virus that causes COVID-19. Institutional protocols and algorithms that pertain to the evaluation of patients at risk for COVID-19 are in a state of rapid change based on information released by regulatory bodies including the CDC and federal and state organizations. These policies and algorithms were followed during the patient's care in the ED.  Some ED evaluations and interventions may be delayed as a result of limited staffing during the pandemic.    Controlled Substance Database was reviewed by me. ____________________________________________   FINAL CLINICAL  IMPRESSION(S) / ED DIAGNOSES   Final diagnoses:  Tachycardia  Dehydration      NEW MEDICATIONS STARTED DURING THIS VISIT:  ED Discharge Orders    None       Note:  This document was prepared using Dragon voice recognition software and may include unintentional dictation errors.    Nita Sickle, MD 11/21/20 [phone]

## 2021-09-20 ENCOUNTER — Emergency Department
Admission: EM | Admit: 2021-09-20 | Discharge: 2021-09-20 | Disposition: A | Discharge status: Home / Self Care | Payer: BLUE CROSS/BLUE SHIELD | Attending: Emergency Medicine | Admitting: Emergency Medicine

## 2021-09-20 ENCOUNTER — Other Ambulatory Visit: Payer: Self-pay

## 2021-09-20 ENCOUNTER — Encounter: Payer: Self-pay | Admitting: Emergency Medicine

## 2021-09-20 DIAGNOSIS — Z20822 Contact with and (suspected) exposure to covid-19: Secondary | ICD-10-CM | POA: Diagnosis not present

## 2021-09-20 DIAGNOSIS — G43809 Other migraine, not intractable, without status migrainosus: Secondary | ICD-10-CM | POA: Insufficient documentation

## 2021-09-20 DIAGNOSIS — R519 Headache, unspecified: Secondary | ICD-10-CM | POA: Diagnosis present

## 2021-09-20 LAB — RESP PANEL BY RT-PCR (FLU A&B, COVID) ARPGX2
Influenza A by PCR: NEGATIVE
Influenza B by PCR: NEGATIVE
SARS Coronavirus 2 by RT PCR: NEGATIVE

## 2021-09-20 MED ORDER — KETOROLAC TROMETHAMINE 30 MG/ML IJ SOLN
30.0000 mg | Freq: Once | INTRAMUSCULAR | Status: AC
  Administered 2021-09-20: 30 mg via INTRAVENOUS
  Filled 2021-09-20: qty 1

## 2021-09-20 MED ORDER — DIPHENHYDRAMINE HCL 50 MG/ML IJ SOLN
50.0000 mg | Freq: Once | INTRAMUSCULAR | Status: AC
Start: 2021-09-20 — End: 2021-09-20
  Administered 2021-09-20: 50 mg via INTRAVENOUS
  Filled 2021-09-20: qty 1

## 2021-09-20 MED ORDER — METOCLOPRAMIDE HCL 5 MG/ML IJ SOLN
10.0000 mg | Freq: Once | INTRAMUSCULAR | Status: AC
  Administered 2021-09-20: 10 mg via INTRAVENOUS
  Filled 2021-09-20: qty 2

## 2021-09-20 MED ORDER — SODIUM CHLORIDE 0.9 % IV BOLUS
1000.0000 mL | Freq: Once | INTRAVENOUS | Status: AC
  Administered 2021-09-20: 1000 mL via INTRAVENOUS

## 2021-09-20 NOTE — ED Provider Notes (Signed)
[facility] Emergency Department Provider Note   ____________________________________________   Event Date/Time   First MD Initiated Contact with Patient 09/20/21 1145     (approximate)  I have reviewed the triage vital signs and the nursing notes.   HISTORY  Chief Complaint Nausea and Headache    HPI Toshiro Ohta is a 26 y.o. adult patient presents to the emergency room with complaint of headache.  He states that he woke up at 2 AM with a severe headache and associated nausea.  He reports the headache as a 7 out of 10.  The pain is described as sharp, stabbing, aching in nature.  He did report that at 2 AM he took a hydrocodone that he had "leftover" that yielded no relief of pain.  Patient denies any history of migraines.  Patient was asleep when I entered room for evaluation but does continue to endorse significant headache.  Past Medical History:  Diagnosis Date   ADHD    Anxiety    Depression    Gender dysphoria    PTSD (post-traumatic stress disorder)     Patient Active Problem List   Diagnosis Date Noted   Depression 01/29/2019   Hormone replacement therapy 01/29/2019   Suicidal ideation 01/29/2019   Anxiety 01/29/2019   PTSD (post-traumatic stress disorder) 01/29/2019   ADHD 01/29/2019    Past Surgical History:  Procedure Laterality Date   FACIAL FRACTURE SURGERY      Prior to Admission medications   Medication Sig Start Date End Date Taking? Authorizing Provider  estradiol (ESTRACE) 1 MG tablet Take 3 tablets (3 mg total) by mouth 2 (two) times a day. Documentation only Patient taking differently: Take 2-3 mg by mouth 2 (two) times a day. Takes 2 mg in the morning, 3 mg at bedtime 01/30/19   Lavella Hammock, MD  FLUoxetine (PROZAC) 20 MG capsule Take 1 capsule (20 mg total) by mouth daily. 01/31/19   Lavella Hammock, MD  risperiDONE (RISPERDAL) 2 MG tablet Take 0.5 tablets (1 mg total) by mouth at bedtime. Patient taking  differently: Take 2 mg by mouth at bedtime.  01/30/19   Lavella Hammock, MD  spironolactone (ALDACTONE) 50 MG tablet Take 100 mg by mouth 2 (two) times daily.    [provider]    Allergies Lithium and Tegretol [carbamazepine]  No family history on file.  Social History Social History   Tobacco Use   Smoking status: Never   Smokeless tobacco: Never  Substance Use Topics   Alcohol use: No   Drug use: Never    Review of Systems  Constitutional: No fever/chills Eyes: No visual changes. ENT: No sore throat. Cardiovascular: Denies chest pain. Respiratory: Denies shortness of breath. Gastrointestinal: No abdominal pain.  No nausea, no vomiting.  No diarrhea.  No constipation. Genitourinary: Negative for dysuria. Musculoskeletal: Negative for back pain. Skin: Negative for rash. Neurological: Positive for headache   ____________________________________________   PHYSICAL EXAM:  VITAL SIGNS: ED Triage Vitals [09/20/21 1115]  Enc Vitals Group     BP 114/68     Pulse Rate 89     Resp 16     Temp 98.2 F (36.8 C)     Temp Source Oral     SpO2 98 %     Weight 124 lb (56.2 kg)     Height 5\' 8"  (1.727 m)     Head Circumference      Peak Flow      Pain Score  7     Pain Loc      Pain Edu?      Excl. in [place]?     Constitutional: Alert and oriented. Well appearing and in no acute distress. Eyes: Conjunctivae are normal. PERRL. EOMI. Head: Atraumatic. Nose: No congestion/rhinnorhea. Mouth/Throat: Mucous membranes are moist.  Oropharynx non-erythematous. Neck: No stridor.   Cardiovascular: Normal rate, regular rhythm. Grossly normal heart sounds.  Good peripheral circulation. Respiratory: Normal respiratory effort.  No retractions. Lungs CTAB. Gastrointestinal: Soft and nontender. No distention. No abdominal bruits. No CVA tenderness. Musculoskeletal: No lower extremity tenderness nor edema.  No joint effusions. Neurologic:  Normal speech and language. No gross  focal neurologic deficits are appreciated. No gait instability. Skin:  Skin is warm, dry and intact. No rash noted. Psychiatric: Mood and affect are normal. Speech and behavior are normal.  ____________________________________________   LABS (all labs ordered are listed, but only abnormal results are displayed)  Labs Reviewed  RESP PANEL BY RT-PCR (FLU A&B, COVID) [id number]   ____________________________________________  EKG   ____________________________________________  RADIOLOGY  ED MD interpretation:    Official radiology report(s): No results found.  ____________________________________________   PROCEDURES  Procedure(s) performed: None  Procedures  Critical Care performed: No  ____________________________________________   INITIAL IMPRESSION / ASSESSMENT AND PLAN / ED COURSE     Patient presents to the emergency room with complaint of waking with a headache at 2 AM.  He states that this is a headache that is a 7 out of 10.  It is not the worst headache of his life.  He has no neurological deficits.  Please see HPI for full explanation.   I will have nurses to start an IV and administer migraine cocktail to include Reglan, Toradol, Benadryl, and saline bolus.  We will then reevaluate patient for relief of migraine.  Patient has finished with migraine cocktail and IV fluids.  On reevaluation he states that headache is completely resolved and he has a 0 of 10 and pain and denies any nausea or vomiting. Patient will be discharged home in stable condition at this time. ____________________________________________   FINAL CLINICAL IMPRESSION(S) / ED DIAGNOSES  Final diagnoses:  Other migraine without status migrainosus, not intractable     ED Discharge Orders     None        Note:  This document was prepared using Dragon voice recognition software and may include unintentional dictation errors.     Willaim Rayas, NP 09/20/21 1350     Harvest Dark, MD 09/20/21 [phone]

## 2021-09-20 NOTE — ED Triage Notes (Signed)
Pt to ED via [facility] from home for headache, nausea, and dizziness. Pt states that symptoms started this morning around 0200 when he woke up. Pt is in NAD.

## 2021-09-20 NOTE — ED Triage Notes (Signed)
Pt in via EMS from home with c/o head pain. Pt reports woke up at 0200 and was thrashing around when he woke up and hit his head. No LOC, no vision loss. Pt took hydrocodone prior to EMS arrival, unknown MG. 127/82, HR 88, 98% RA

## 2021-09-20 NOTE — ED Notes (Signed)
Pt provided discharge instructions and prescription information. Pt was given the opportunity to ask questions and questions were answered. Discharge signature not obtained in the setting of the COVID-19 pandemic in order to reduce high touch surfaces.  ° °

## 2022-04-11 ENCOUNTER — Emergency Department
Admission: EM | Admit: 2022-04-11 | Discharge: 2022-04-11 | Disposition: A | Discharge status: Home / Self Care | Payer: BLUE CROSS/BLUE SHIELD | Attending: Emergency Medicine | Admitting: Emergency Medicine

## 2022-04-11 ENCOUNTER — Encounter: Payer: Self-pay | Admitting: Emergency Medicine

## 2022-04-11 ENCOUNTER — Other Ambulatory Visit: Payer: Self-pay

## 2022-04-11 DIAGNOSIS — J020 Streptococcal pharyngitis: Secondary | ICD-10-CM

## 2022-04-11 DIAGNOSIS — R112 Nausea with vomiting, unspecified: Secondary | ICD-10-CM | POA: Diagnosis not present

## 2022-04-11 DIAGNOSIS — R42 Dizziness and giddiness: Secondary | ICD-10-CM | POA: Diagnosis not present

## 2022-04-11 DIAGNOSIS — R509 Fever, unspecified: Secondary | ICD-10-CM | POA: Diagnosis present

## 2022-04-11 DIAGNOSIS — Z20822 Contact with and (suspected) exposure to covid-19: Secondary | ICD-10-CM | POA: Insufficient documentation

## 2022-04-11 LAB — BASIC METABOLIC PANEL
Anion gap: 8 (ref 5–15)
BUN: 11 mg/dL (ref 6–20)
CO2: 23 mmol/L (ref 22–32)
Calcium: 9.6 mg/dL (ref 8.9–10.3)
Chloride: 103 mmol/L (ref 98–111)
Creatinine, Ser: 0.71 mg/dL (ref 0.61–1.24)
GFR, Estimated: >60 mL/min (ref >60)
Glucose, Bld: 147 mg/dL — ABNORMAL HIGH (ref 70–99)
Potassium: 3.9 mmol/L (ref 3.5–5.1)
Sodium: 134 mmol/L — ABNORMAL LOW (ref 135–145)

## 2022-04-11 LAB — GROUP A STREP BY PCR: Group A Strep by PCR: DETECTED — AB

## 2022-04-11 LAB — CBC WITH DIFFERENTIAL/PLATELET
Abs Immature Granulocytes: 0.16 10*3/uL — ABNORMAL HIGH (ref 0.00–0.07)
Basophils Absolute: 0.1 10*3/uL (ref 0.0–0.1)
Basophils Relative: 0 %
Eosinophils Absolute: 0 10*3/uL (ref 0.0–0.5)
Eosinophils Relative: 0 %
HCT: 39.4 % (ref 39.0–52.0)
Hemoglobin: 13.8 g/dL (ref 13.0–17.0)
Immature Granulocytes: 1 %
Lymphocytes Relative: 3 %
Lymphs Abs: 0.8 10*3/uL (ref 0.7–4.0)
MCH: 29.6 pg (ref 26.0–34.0)
MCHC: 35 g/dL (ref 30.0–36.0)
MCV: 84.5 fL (ref 80.0–100.0)
Monocytes Absolute: 1.5 10*3/uL — ABNORMAL HIGH (ref 0.1–1.0)
Monocytes Relative: 5 %
Neutro Abs: 25.1 10*3/uL — ABNORMAL HIGH (ref 1.7–7.7)
Neutrophils Relative %: 91 %
Platelets: 336 10*3/uL (ref 150–400)
RBC: 4.66 MIL/uL (ref 4.22–5.81)
RDW: 12.4 % (ref 11.5–15.5)
Smear Review: ADEQUATE
WBC: 27.6 10*3/uL — ABNORMAL HIGH (ref 4.0–10.5)
nRBC: 0 % (ref 0.0–0.2)

## 2022-04-11 LAB — CBC
HCT: 39.1 % (ref 39.0–52.0)
Hemoglobin: 13.8 g/dL (ref 13.0–17.0)
MCH: 29.7 pg (ref 26.0–34.0)
MCHC: 35.3 g/dL (ref 30.0–36.0)
MCV: 84.1 fL (ref 80.0–100.0)
Platelets: 333 10*3/uL (ref 150–400)
RBC: 4.65 MIL/uL (ref 4.22–5.81)
RDW: 12.4 % (ref 11.5–15.5)
WBC: 27.7 10*3/uL — ABNORMAL HIGH (ref 4.0–10.5)
nRBC: 0 % (ref 0.0–0.2)

## 2022-04-11 LAB — HEPATIC FUNCTION PANEL
ALT: 12 U/L (ref 0–44)
AST: 19 U/L (ref 15–41)
Albumin: 4.5 g/dL (ref 3.5–5.0)
Alkaline Phosphatase: 54 U/L (ref 38–126)
Bilirubin, Direct: <0.1 mg/dL (ref 0.0–0.2)
Total Bilirubin: 0.7 mg/dL (ref 0.3–1.2)
Total Protein: 7.6 g/dL (ref 6.5–8.1)

## 2022-04-11 LAB — SARS CORONAVIRUS 2 BY RT PCR: SARS Coronavirus 2 by RT PCR: NEGATIVE

## 2022-04-11 MED ORDER — SODIUM CHLORIDE 0.9 % IV SOLN
1.0000 g | Freq: Once | INTRAVENOUS | Status: AC
  Administered 2022-04-11: 1 g via INTRAVENOUS
  Filled 2022-04-11: qty 10

## 2022-04-11 MED ORDER — CEPHALEXIN 500 MG PO CAPS: 500.0000 mg | ORAL_CAPSULE | Freq: Three times a day (TID) | ORAL | 0 refills | Status: AC

## 2022-04-11 MED ORDER — CEPHALEXIN 500 MG PO CAPS
500.0000 mg | ORAL_CAPSULE | Freq: Three times a day (TID) | ORAL | 0 refills | Status: DC
  Filled 2022-04-11: qty 30, 10d supply, fill #0

## 2022-04-11 NOTE — ED Triage Notes (Signed)
Pt reports fever that started this morning. Pt reports nausea and vomiting 1 time while at work. Pt also reporting leg weakness and dizziness when standing.

## 2022-04-11 NOTE — ED Provider Notes (Signed)
[facility] Provider Note    Event Date/Time   First MD Initiated Contact with Patient 04/11/22 1302     (approximate)   History   Fever   HPI  George [name] is a 26 y.o. adult transgender male who complains of fever.  Patient also had nausea 1 episode of vomiting and sore throat.  Symptoms started at work today.  Patient is also dizziness and felt legs were weak.      Physical Exam   Triage Vital Signs: ED Triage Vitals  Enc Vitals Group     BP 04/11/22 1158 119/78     Pulse Rate 04/11/22 1158 85     Resp 04/11/22 1158 17     Temp 04/11/22 1158 (!) 100.5 F (38.1 C)     Temp Source 04/11/22 1158 Oral     SpO2 04/11/22 1158 98 %     Weight 04/11/22 1315 123 lb 14.4 oz (56.2 kg)     Height 04/11/22 1315 5\' 8"  (1.727 m)     Head Circumference --      Peak Flow --      Pain Score --      Pain Loc --      Pain Edu? --      Excl. in GC? --     Most recent vital signs: Vitals:   04/11/22 1158  BP: 119/78  Pulse: 85  Resp: 17  Temp: (!) 100.5 F (38.1 C)  SpO2: 98%    General: Awake, no distress.  CV:  Good peripheral perfusion.  Heart regular rate and rhythm no audible murmurs Resp:  Normal effort.  Lungs are clear patient has no cough Abd:  No distention.  Soft bowel sounds positive nontender Back nontender no CVA tenderness no spinal tenderness Extremities no edema Skin with no apparent rash   ED Results / Procedures / Treatments   Labs (all labs ordered are listed, but only abnormal results are displayed) Labs Reviewed  GROUP A STREP BY PCR - Abnormal; Notable for the following components:      Result Value   Group A Strep by PCR DETECTED (*)    All other components within normal limits  CBC - Abnormal; Notable for the following components:   WBC 27.7 (*)    All other components within normal limits  BASIC METABOLIC PANEL - Abnormal; Notable for the following components:   Sodium 134 (*)    Glucose, Bld 147 (*)     All other components within normal limits  CBC WITH DIFFERENTIAL/PLATELET - Abnormal; Notable for the following components:   WBC 27.6 (*)    Neutro Abs 25.1 (*)    Monocytes Absolute 1.5 (*)    Abs Immature Granulocytes 0.16 (*)    All other components within normal limits  SARS CORONAVIRUS 2 BY RT PCR  HEPATIC FUNCTION PANEL  URINALYSIS, ROUTINE W REFLEX MICROSCOPIC     EKG     RADIOLOGY    PROCEDURES:  Critical Care performed:  Procedures   MEDICATIONS ORDERED IN ED: Medications  cefTRIAXone (ROCEPHIN) 1 g in sodium chloride 0.9 % 100 mL IVPB (0 g Intravenous Stopped 04/11/22 1608)     IMPRESSION / MDM / ASSESSMENT AND PLAN / ED COURSE  I reviewed the triage vital signs and the nursing notes. Patient with strep throat.  Patient has a red throat and swollen glands..  I have seen high white counts like this before with strep.  I will treat with Keflex.  Because the white count is so high I will give 1 dose of IV antibiotics.  Patient is looking well especially after the fluids.  After this I will discharge.  CBC the differential shows no sign of viral infection or mononucleosis.  COVID is negative.  Patient has no dysuria or flank pain.  Patient's presentation is most consistent with acute complicated illness / injury requiring diagnostic workup.  The patient is on the cardiac monitor to evaluate for evidence of arrhythmia and/or significant heart rate changes.  None were seen      FINAL CLINICAL IMPRESSION(S) / ED DIAGNOSES   Final diagnoses:  Strep pharyngitis     Rx / DC Orders   ED Discharge Orders          Ordered    cephALEXin (KEFLEX) 500 MG capsule  3 times daily,   Status:  Discontinued        04/11/22 1440    cephALEXin (KEFLEX) 500 MG capsule  3 times daily        04/11/22 1613             Note:  This document was prepared using Dragon voice recognition software and may include unintentional dictation errors.   Arnaldo Natal,  MD 04/11/22 1626

## 2022-04-11 NOTE — Discharge Instructions (Signed)
Your strep test was positive.  This along with the redness in your throat and the swollen glands in your neck probably explain the fever that you are having.  I will give you Keflex 3 times a day which should take care of of the strep.  I have also given you 1 dose of IV antibiotics just to get things started since her white blood count was so high.  Please return here for any worsening of your symptoms or if you are not better when the antibiotics are finished.

## 2022-04-30 IMAGING — CR DG CHEST 2V
2 series · 2 of 2 positions shown · non-contrast
Comparison: None.

CLINICAL DATA: Chest pain

EXAM:
CHEST - 2 VIEW

[chest pa]
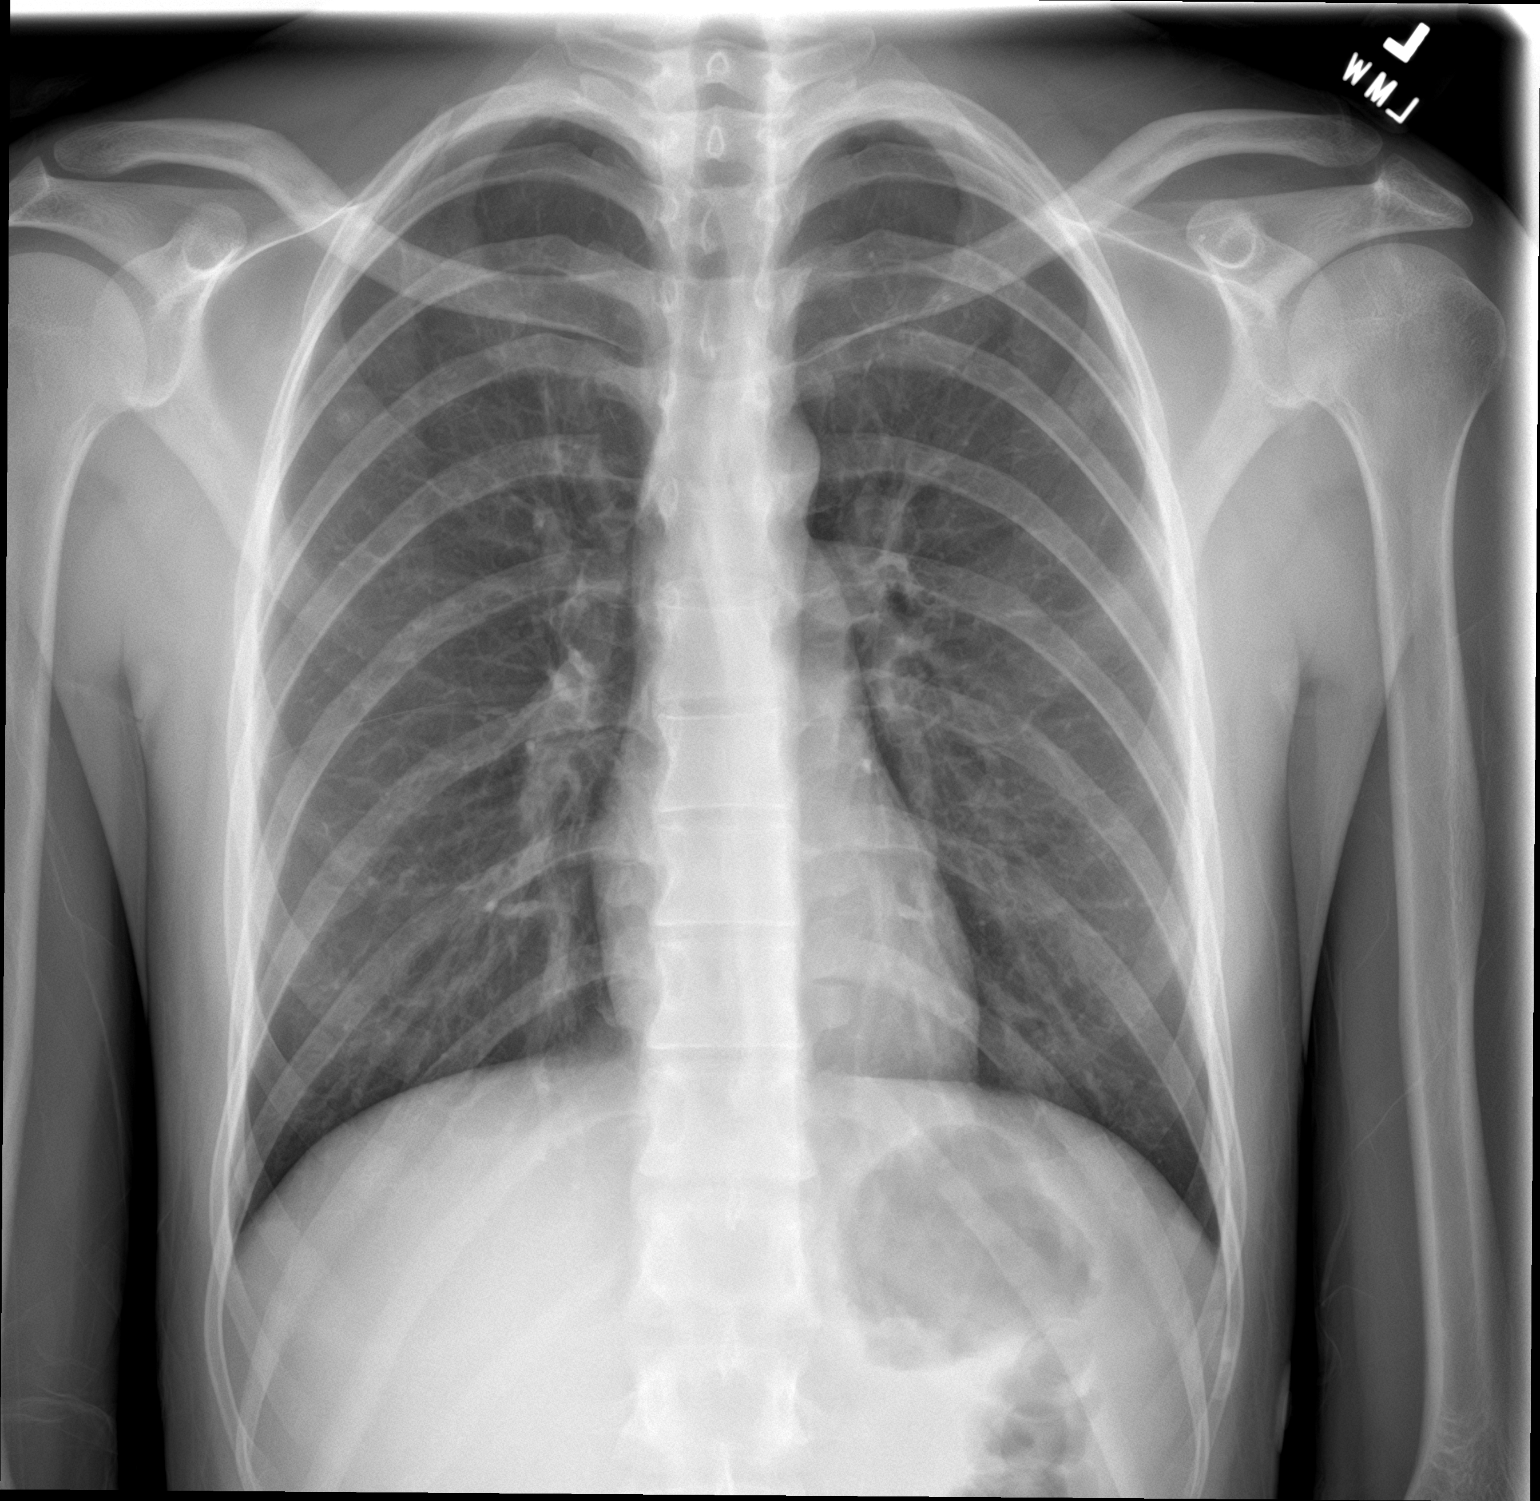

[chest lat]
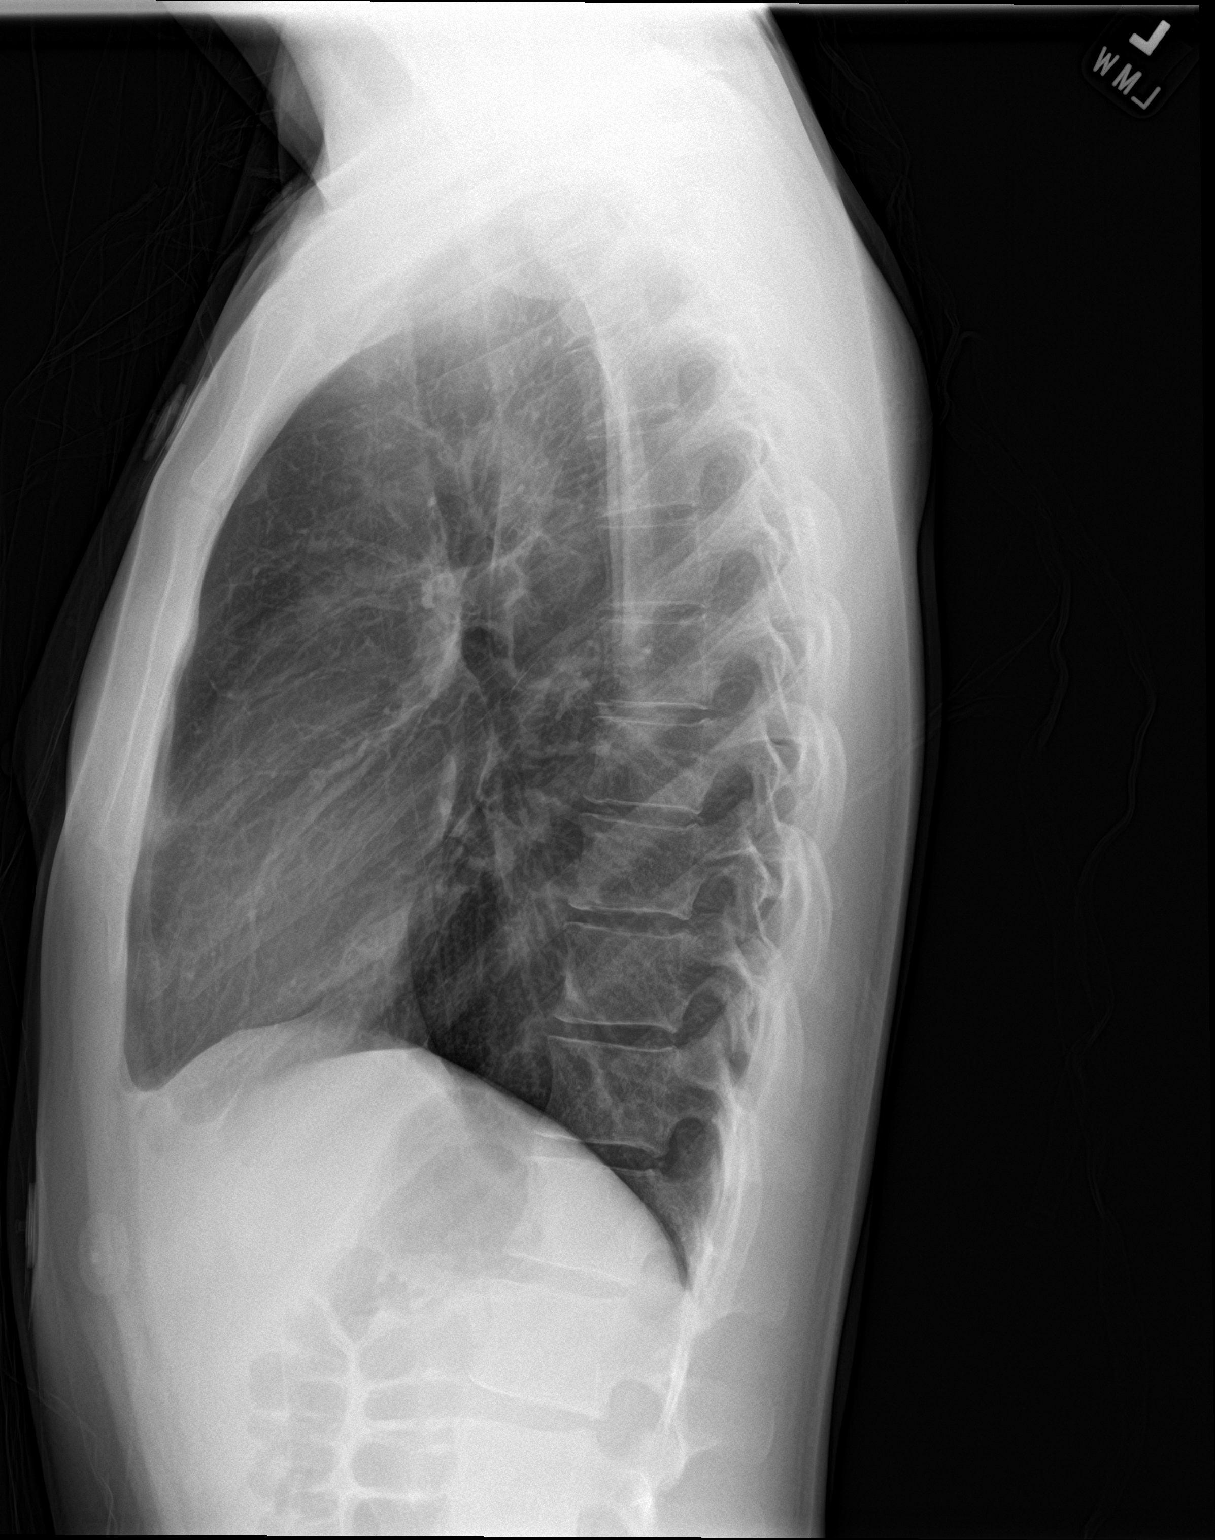

[2 of 2 positions shown; findings below may reference images not displayed]

FINDINGS: The heart size and mediastinal contours are within normal limits.
Both lungs are clear. The visualized skeletal structures are
unremarkable.
IMPRESSION: No active cardiopulmonary disease.

## 2022-07-22 ENCOUNTER — Emergency Department
Admission: EM | Admit: 2022-07-22 | Discharge: 2022-07-23 | Disposition: A | Discharge status: Home / Self Care | Payer: BLUE CROSS/BLUE SHIELD | Attending: Emergency Medicine | Admitting: Emergency Medicine

## 2022-07-22 ENCOUNTER — Other Ambulatory Visit: Payer: Self-pay

## 2022-07-22 DIAGNOSIS — F909 Attention-deficit hyperactivity disorder, unspecified type: Secondary | ICD-10-CM | POA: Diagnosis not present

## 2022-07-22 DIAGNOSIS — R45851 Suicidal ideations: Secondary | ICD-10-CM | POA: Diagnosis not present

## 2022-07-22 DIAGNOSIS — Z79890 Hormone replacement therapy (postmenopausal): Secondary | ICD-10-CM

## 2022-07-22 DIAGNOSIS — F419 Anxiety disorder, unspecified: Secondary | ICD-10-CM | POA: Diagnosis present

## 2022-07-22 DIAGNOSIS — F418 Other specified anxiety disorders: Secondary | ICD-10-CM | POA: Diagnosis not present

## 2022-07-22 DIAGNOSIS — D72829 Elevated white blood cell count, unspecified: Secondary | ICD-10-CM | POA: Diagnosis not present

## 2022-07-22 DIAGNOSIS — F4310 Post-traumatic stress disorder, unspecified: Secondary | ICD-10-CM | POA: Diagnosis not present

## 2022-07-22 DIAGNOSIS — Z046 Encounter for general psychiatric examination, requested by authority: Secondary | ICD-10-CM | POA: Diagnosis present

## 2022-07-22 DIAGNOSIS — F32A Depression, unspecified: Secondary | ICD-10-CM | POA: Diagnosis present

## 2022-07-22 LAB — URINE DRUG SCREEN, QUALITATIVE (ARMC ONLY)
Amphetamines, Ur Screen: NOT DETECTED
Barbiturates, Ur Screen: NOT DETECTED
Benzodiazepine, Ur Scrn: NOT DETECTED
Cannabinoid 50 Ng, Ur ~~LOC~~: NOT DETECTED
Cocaine Metabolite,Ur ~~LOC~~: NOT DETECTED
MDMA (Ecstasy)Ur Screen: NOT DETECTED
Methadone Scn, Ur: NOT DETECTED
Opiate, Ur Screen: NOT DETECTED
Phencyclidine (PCP) Ur S: NOT DETECTED
Tricyclic, Ur Screen: NOT DETECTED

## 2022-07-22 LAB — CBC
HCT: 39.7 % (ref 39.0–52.0)
Hemoglobin: 14.2 g/dL (ref 13.0–17.0)
MCH: 30.2 pg (ref 26.0–34.0)
MCHC: 35.8 g/dL (ref 30.0–36.0)
MCV: 84.5 fL (ref 80.0–100.0)
Platelets: 298 10*3/uL (ref 150–400)
RBC: 4.7 MIL/uL (ref 4.22–5.81)
RDW: 12.8 % (ref 11.5–15.5)
WBC: 13.8 10*3/uL — ABNORMAL HIGH (ref 4.0–10.5)
nRBC: 0 % (ref 0.0–0.2)

## 2022-07-22 LAB — ETHANOL: Alcohol, Ethyl (B): <10 mg/dL (ref <10)

## 2022-07-22 LAB — COMPREHENSIVE METABOLIC PANEL
ALT: 13 U/L (ref 0–44)
AST: 18 U/L (ref 15–41)
Albumin: 4.6 g/dL (ref 3.5–5.0)
Alkaline Phosphatase: 54 U/L (ref 38–126)
Anion gap: 4 — ABNORMAL LOW (ref 5–15)
BUN: 12 mg/dL (ref 6–20)
CO2: 28 mmol/L (ref 22–32)
Calcium: 9.6 mg/dL (ref 8.9–10.3)
Chloride: 105 mmol/L (ref 98–111)
Creatinine, Ser: 0.72 mg/dL (ref 0.61–1.24)
GFR, Estimated: >60 mL/min (ref >60)
Glucose, Bld: 84 mg/dL (ref 70–99)
Potassium: 4.1 mmol/L (ref 3.5–5.1)
Sodium: 137 mmol/L (ref 135–145)
Total Bilirubin: 0.4 mg/dL (ref 0.3–1.2)
Total Protein: 7.5 g/dL (ref 6.5–8.1)

## 2022-07-22 LAB — SALICYLATE LEVEL: Salicylate Lvl: <7 mg/dL — ABNORMAL LOW (ref 7.0–30.0)

## 2022-07-22 LAB — ACETAMINOPHEN LEVEL: Acetaminophen (Tylenol), Serum: <10 ug/mL — ABNORMAL LOW (ref 10–30)

## 2022-07-22 NOTE — ED Notes (Signed)
Pt sock made hair tie obtained by this RN and discarded of appropriately.  Pt calm and cooperative.

## 2022-07-22 NOTE — ED Notes (Signed)
Pt alert, pt states that they have some SI thoughts at times but no plan. Pt states they want to make sure they are ok. Pt cooperative and calm.

## 2022-07-22 NOTE — ED Triage Notes (Signed)
Pt presents to ED with c/o of SI that started last month. 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 Pt is voluntary at this time. BPD with pt at this time. Pt states this new episode started last night.   Pt states his depression "comes in waves like my ideations".   Pt denies any plans at the moment.   Pt is A&Ox4. Pt denies drug or ETOH use.

## 2022-07-22 NOTE — ED Notes (Signed)
VOL/pending psych consult 

## 2022-07-22 NOTE — ED Notes (Signed)
Pt belongings:  2 cell phone Set of keys Silver like necklace Black watch Silver cross earrings Bracelet Belly button ring Pink fish net stockings Black/white bra Pink crop top shirt Pair of sketcher shoes Carney Bern shorts Pair of white socks Flower backpack

## 2022-07-22 NOTE — ED Notes (Signed)
Dietary called for dinner tray for pt.

## 2022-07-22 NOTE — Consult Note (Signed)
[facility] Consult   Reason for Consult:Suicidal   Referring Physician: Dr. Kerman Passey Patient Identification: George [name] MRN:  [id number] Principal Diagnosis: <principal problem not specified> Diagnosis:  Active Problems:   Depression   Hormone replacement therapy   Suicidal ideation   Anxiety   PTSD (post-traumatic stress disorder)   ADHD   Total Time spent with patient: 1 hour  Subjective: "I was suicidal on 06/11/22, but I did not come to the hospital."    George [name] is a 26 y.o. adult patient presented to [facility] ED via POV voluntarily.  The patient is seen as euthymic and blithe about being here for her mental health. She voiced that she tried to choke herself, but "it did not work, chuckle." "I know. I can't commit suicide that way." The patient states she is not on medication because she can't find an outpatient provider. He was asked about [facility]. "It is a conflict of interest." She did not elaborate further. She shared that she is on her transitional medication. This provider saw The patient face-to-face; the chart was reviewed, and Dr. Kerman Passey was consulted on 07/22/2022 due to the patient's care. It was discussed with the EDP that the patient does not meet the criteria to be admitted to the psychiatric inpatient unit.  Upon evaluation, the patient is alert and oriented x 4, calm, cooperative, and mood-congruent with affect. The patient does not appear to be responding to internal or external stimuli. Neither is the patient presenting with any delusional thinking. The patient denies auditory or visual hallucinations. The patient admits to passive suicidal ideation but denies homicidal or self-harm ideations. The patient is not presenting with any psychotic or paranoid behaviors. During an encounter with the patient, she could answer questions appropriately.    HPI: Per Dr. Kerman Passey, George [name] is a 26 y.o. adult with a past medical history of ADHD,  anxiety, transgender, presents to the emergency department for depression and suicidal thoughts.  According to the patient she states last month she was having suicidal thoughts of dismantling a deli slicer and using the blade to cut herself.  States she is no longer having the suicidal thoughts but is now having depression so she came to the emergency department for evaluation.  Here the patient is awake alert she is laughing and joking with other patients.  Patient has no medical complaints.  Denies any drugs or alcohol use.   Past Psychiatric History:  ADHD Anxiety Depression Gender dysphoria PTSD (post-traumatic stress disorder)   Risk to Self:   Risk to Others:   Prior Inpatient Therapy:   Prior Outpatient Therapy:    Past Medical History:  Past Medical History:  Diagnosis Date   ADHD    Anxiety    Depression    Gender dysphoria    PTSD (post-traumatic stress disorder)     Past Surgical History:  Procedure Laterality Date   FACIAL FRACTURE SURGERY     Family History: History reviewed. No pertinent family history. Family Psychiatric  History: History reviewed. No pertinent family psychiatric history Social History:  Social History   Substance and Sexual Activity  Alcohol Use No     Social History   Substance and Sexual Activity  Drug Use Never    Social History   Socioeconomic History   Marital status: Single    Spouse name: Not on file   Number of children: Not on file   Years of education: Not on file   Highest education level: Not on file  Occupational History   Not on file  Tobacco Use   Smoking status: Never   Smokeless tobacco: Never  Substance and Sexual Activity   Alcohol use: No   Drug use: Never   Sexual activity: Not on file  Other Topics Concern   Not on file  Social History Narrative   Not on file   Social Determinants of Health   Financial Resource Strain: Not on file  Food Insecurity: Not on file  Transportation Needs: Not on file   Physical Activity: Not on file  Stress: Not on file  Social Connections: Not on file   Additional Social History:    Allergies:   Allergies  Allergen Reactions   Lithium Other (See Comments)    Liver Inflamation   Tegretol [Carbamazepine] Other (See Comments)    Liver Inflamation    Labs:  Results for orders placed or performed during the hospital encounter of 07/22/22 (from the past 48 hour(s))  Comprehensive metabolic panel     Status: Abnormal   Collection Time: 07/22/22  5:03 PM  Result Value Ref Range   Sodium 137 135 - 145 mmol/L   Potassium 4.1 3.5 - 5.1 mmol/L   Chloride 105 98 - 111 mmol/L   CO2 28 22 - 32 mmol/L   Glucose, Bld 84 70 - 99 mg/dL    Comment: Glucose reference range applies only to samples taken after fasting for at least 8 hours.   BUN 12 6 - 20 mg/dL   Creatinine, Ser 0.72 0.61 - 1.24 mg/dL   Calcium 9.6 8.9 - 10.3 mg/dL   Total Protein 7.5 6.5 - 8.1 g/dL   Albumin 4.6 3.5 - 5.0 g/dL   AST 18 15 - 41 U/L   ALT 13 0 - 44 U/L   Alkaline Phosphatase 54 38 - 126 U/L   Total Bilirubin 0.4 0.3 - 1.2 mg/dL   GFR, Estimated >60 >60 mL/min    Comment: (NOTE) Calculated using the CKD-EPI Creatinine Equation (2021)    Anion gap 4 (L) 5 - 15    Comment: Performed at [facility], [address] [place], [place] [address]  Ethanol     Status: None   Collection Time: 07/22/22  5:03 PM  Result Value Ref Range   Alcohol, Ethyl (B) <10 <10 mg/dL    Comment: (NOTE) Lowest detectable limit for serum alcohol is 10 mg/dL.  For medical purposes only. Performed at [facility], [address] [place], [place] [id number]   Salicylate level     Status: Abnormal   Collection Time: 07/22/22  5:03 PM  Result Value Ref Range   Salicylate Lvl Q000111Q (L) 7.0 - 30.0 mg/dL    Comment: Performed at [facility], [address] [place], [place] [address]  Acetaminophen level     Status: Abnormal   Collection Time: 07/22/22  5:03 PM   Result Value Ref Range   Acetaminophen (Tylenol), Serum <10 (L) 10 - 30 ug/mL    Comment: (NOTE) Therapeutic concentrations vary significantly. A range of 10-30 ug/mL  may be an effective concentration for many patients. However, some  are best treated at concentrations outside of this range. Acetaminophen concentrations >150 ug/mL at 4 hours after ingestion  and >50 ug/mL at 12 hours after ingestion are often associated with  toxic reactions.  Performed at [facility], [address] [place],  [address]   cbc     Status: Abnormal   Collection Time: 07/22/22  5:03  PM  Result Value Ref Range   WBC 13.8 (H) 4.0 - 10.5 K/uL   RBC 4.70 4.22 - 5.81 MIL/uL   Hemoglobin 14.2 13.0 - 17.0 g/dL   HCT 29.9 24.2 - 68.3 %   MCV 84.5 80.0 - 100.0 fL   MCH 30.2 26.0 - 34.0 pg   MCHC 35.8 30.0 - 36.0 g/dL   RDW 41.9 62.2 - 29.7 %   Platelets 298 150 - 400 K/uL   nRBC 0.0 0.0 - 0.2 %    Comment: Performed at [facility], [address], [place], [place] [address]  Urine Drug Screen, Qualitative     Status: None   Collection Time: 07/22/22  5:03 PM  Result Value Ref Range   Tricyclic, Ur Screen NONE DETECTED NONE DETECTED   Amphetamines, Ur Screen NONE DETECTED NONE DETECTED   MDMA (Ecstasy)Ur Screen NONE DETECTED NONE DETECTED   Cocaine Metabolite,Ur [place] NONE DETECTED NONE DETECTED   Opiate, Ur Screen NONE DETECTED NONE DETECTED   Phencyclidine (PCP) Ur S NONE DETECTED NONE DETECTED   Cannabinoid 50 Ng, Ur  NONE DETECTED NONE DETECTED   Barbiturates, Ur Screen NONE DETECTED NONE DETECTED   Benzodiazepine, Ur Scrn NONE DETECTED NONE DETECTED   Methadone Scn, Ur NONE DETECTED NONE DETECTED    Comment: (NOTE) Tricyclics + metabolites, urine    Cutoff 1000 ng/mL Amphetamines + metabolites, urine  Cutoff 1000 ng/mL MDMA (Ecstasy), urine              Cutoff 500 ng/mL Cocaine Metabolite, urine          Cutoff 300 ng/mL Opiate + metabolites, urine        Cutoff  300 ng/mL Phencyclidine (PCP), urine         Cutoff 25 ng/mL Cannabinoid, urine                 Cutoff 50 ng/mL Barbiturates + metabolites, urine  Cutoff 200 ng/mL Benzodiazepine, urine              Cutoff 200 ng/mL Methadone, urine                   Cutoff 300 ng/mL  The urine drug screen provides only a preliminary, unconfirmed analytical test result and should not be used for non-medical purposes. Clinical consideration and professional judgment should be applied to any positive drug screen result due to possible interfering substances. A more specific alternate chemical method must be used in order to obtain a confirmed analytical result. Gas chromatography / mass spectrometry (GC/MS) is the preferred confirm atory method. Performed at [facility], [address] [place], [place] [address]     No current facility-administered medications for this encounter.   Current Outpatient Medications  Medication Sig Dispense Refill   progesterone (PROMETRIUM) 100 MG capsule Take 100 mg by mouth at bedtime.     spironolactone (ALDACTONE) 50 MG tablet Take 100 mg by mouth 2 (two) times daily. Takes 200 qhs     estradiol (ESTRACE) 1 MG tablet Take 3 tablets (3 mg total) by mouth 2 (two) times a day. Documentation only (Patient not taking: Reported on 07/22/2022) 180 tablet    estradiol valerate (DELESTROGEN) 40 MG/ML injection Inject 40 mg into the muscle every 14 (fourteen) days.     FLUoxetine (PROZAC) 20 MG capsule Take 1 capsule (20 mg total) by mouth daily. (Patient not taking: Reported on 07/22/2022) 30 capsule 2   risperiDONE (RISPERDAL) 2 MG tablet Take 0.5  tablets (1 mg total) by mouth at bedtime. (Patient not taking: Reported on 07/22/2022) 30 tablet 2    Musculoskeletal: Strength & Muscle Tone: within normal limits Gait & Station: normal Patient leans: N/A  Psychiatric Specialty Exam:  Presentation  General Appearance:  Appropriate for Environment  Eye  Contact: Good  Speech: Clear and Coherent  Speech Volume: Normal  Handedness: Right   Mood and Affect  Mood: Euthymic  Affect: Appropriate   Thought Process  Thought Processes: Coherent  Descriptions of Associations:Intact  Orientation:Full (Time, Place and Person)  Thought Content:Logical  History of Schizophrenia/Schizoaffective disorder:No data recorded Duration of Psychotic Symptoms:No data recorded Hallucinations:Hallucinations: None  Ideas of Reference:None  Suicidal Thoughts:Suicidal Thoughts: Yes, Passive SI Passive Intent and/or Plan: Without Intent; Without Plan  Homicidal Thoughts:Homicidal Thoughts: No   Sensorium  Memory: Immediate Good; Recent Good; Remote Good  Judgment: Fair  Insight: Fair   Materials engineer: Fair  Attention Span: Fair  Recall: [employer] of Knowledge: Fair  Language: Fair   Psychomotor Activity  Psychomotor Activity: Psychomotor Activity: Normal   Assets  Assets: Armed forces logistics/support/administrative officer; Desire for Improvement; Intimacy; Social Support   Sleep  Sleep: Sleep: Good Number of Hours of Sleep: 8   Physical Exam: Physical Exam Vitals and nursing note reviewed.  Constitutional:      Appearance: Normal appearance.  HENT:     Head: Normocephalic and atraumatic.     Right Ear: External ear normal.     Left Ear: External ear normal.     Nose: Nose normal.     Mouth/Throat:     Mouth: Mucous membranes are moist.  Cardiovascular:     Rate and Rhythm: Normal rate.     Pulses: Normal pulses.  Pulmonary:     Effort: Pulmonary effort is normal.  Musculoskeletal:        General: Normal range of motion.  Neurological:     General: No focal deficit present.     Mental Status: She is alert and oriented to person, place, and time. Mental status is at baseline.  Psychiatric:        Mood and Affect: Mood normal.        Behavior: Behavior normal.    Review of Systems   Psychiatric/Behavioral:  Positive for suicidal ideas.    Blood pressure 105/67, pulse 84, temperature 97.8 F (36.6 C), temperature source Oral, resp. rate 18, SpO2 98 %. There is no height or weight on file to calculate BMI.  Treatment Plan Summary: Plan   Patient does not meet the criteria for psychiatric inpatient admission  Disposition: No evidence of imminent risk to self or others at present.   Patient does not meet criteria for psychiatric inpatient admission. Supportive therapy provided about ongoing stressors. Discussed crisis plan, support from social network, calling 911, coming to the Emergency Department, and calling Suicide Hotline.  Caroline Sauger, NP 07/22/2022 10:09 PM

## 2022-07-22 NOTE — ED Notes (Signed)
Per pt, none of her family members have transportation to pick her up. Pt states that she has no license.  This RN called mother on file and mother Marcelino Duster stated that she has no transportation, sister Dwana Curd on file number out of service, number given by mother for sister Dwana Curd also out of service. [employer] [employer] voice mail states that they do not have transportation after 1830.

## 2022-07-22 NOTE — ED Notes (Signed)
This pt asked for something to put up pts hair. The elastic from a skid-free sock was provided for pt to put up his hair. Pt said "Thank you."

## 2022-07-22 NOTE — ED Notes (Signed)
Per pt mother, pt lives with her in [place].  No one in the home has a driver's license to come pick up and no other contacts available.  Pt uses [employer] and walks as her normal modes of transportation. Per mom [employer] has 0715 transport from [facility].  Charge Nurse Morrie Sheldon notified.

## 2022-07-22 NOTE — ED Notes (Signed)
Per pt, preferred pronouns are she/her.

## 2022-07-22 NOTE — ED Provider Triage Note (Signed)
Emergency Medicine Provider Triage Evaluation Note  George [name] , a 26 y.o. adult  was evaluated in triage. Male to male. Pt complains of SI. Symptoms began on 10/20, which is when she had broken up with her partners, and threatened to stab them. Had a plan to take apart the deli slicer at work at [employer] and slice her neck with it. Reports that last night she began to to feel depressed again but she is not sure why. Reports she has tried to hurt herself in the past, which she does by choking herself. Denies substance use.  Review of Systems  Positive: SI Negative: HI, AVH  Physical Exam  There were no vitals taken for this visit. Gen:   Awake, no distress   Resp:  Normal effort  MSK:   Moves extremities without difficulty  Other:    Medical Decision Making  Medically screening exam initiated at 4:31 PM.  Appropriate orders placed.  George [name] was informed that the remainder of the evaluation will be completed by another provider, this initial triage assessment does not replace that evaluation, and the importance of remaining in the ED until their evaluation is complete.     Jackelyn Hoehn, PA-C 07/22/22 1636

## 2022-07-22 NOTE — ED Provider Notes (Signed)
   [facility] Provider Note    Event Date/Time   First MD Initiated Contact with Patient 07/22/22 1655     (approximate)  History   Chief Complaint: Suicidal  HPI  George [name] is a 26 y.o. adult with a past medical history of ADHD, anxiety, transgender, presents to the emergency department for depression and suicidal thoughts.  According to the patient she states last month she was having suicidal thoughts of dismantling a deli slicer and using the blade to cut herself.  States she is no longer having the suicidal thoughts but is now having depression so she came to the emergency department for evaluation.  Here the patient is awake alert she is laughing and joking with other patients.  Patient has no medical complaints.  Denies any drugs or alcohol use.  Physical Exam   Triage Vital Signs: ED Triage Vitals  Enc Vitals Group     BP 07/22/22 1635 132/89     Pulse Rate 07/22/22 1635 78     Resp 07/22/22 1635 17     Temp 07/22/22 1635 (!) 97.5 F (36.4 C)     Temp Source 07/22/22 1635 Oral     SpO2 07/22/22 1635 100 %     Weight --      Height --      Head Circumference --      Peak Flow --      Pain Score 07/22/22 1636 0     Pain Loc --      Pain Edu? --      Excl. in GC? --     Most recent vital signs: Vitals:   07/22/22 1635  BP: 132/89  Pulse: 78  Resp: 17  Temp: (!) 97.5 F (36.4 C)  SpO2: 100%    General: Awake, no distress.  CV:  Good peripheral perfusion.  Regular rate and rhythm  Resp:  Normal effort.  Equal breath sounds bilaterally.  Abd:  No distention.     ED Results / Procedures / Treatments   MEDICATIONS ORDERED IN ED: Medications - No data to display   IMPRESSION / MDM / ASSESSMENT AND PLAN / ED COURSE  I reviewed the triage vital signs and the nursing notes.  Patient's presentation is most consistent with acute presentation with potential threat to life or bodily function.  Patient presents emergency department  for depression.  States suicidal thoughts last month but none currently.  Patient denies any medical complaints.  We will check labs we will have psychiatry and TTS evaluate.  As the patient denies any active suicidal thoughts I do not believe she meets IVC criteria currently but the patient is willing to stay voluntarily to speak to psychiatry.  Lab work is reassuring slight leukocytosis otherwise normal CBC, reassuring chemistry, negative acetaminophen salicylate and ethanol level.  Negative urine drug screen.  Psychiatry is seen and evaluated the patient they believe the patient safe for discharge home with outpatient follow-up.  We will provide outpatient follow-up to [facility] as needed.  Medical work-up is nonrevealing.  FINAL CLINICAL IMPRESSION(S) / ED DIAGNOSES   Depression   Note:  This document was prepared using Dragon voice recognition software and may include unintentional dictation errors.   Minna Antis, MD 07/22/22 2149

## 2022-07-22 NOTE — ED Triage Notes (Signed)
First Nurse Note:  Arrives to ED for voluntary evaluation of SI.  Patient presents joking, laughing, calm and cooperative. NAD

## 2022-07-22 NOTE — Discharge Instructions (Signed)
You have been seen in the emergency department for a  psychiatric concern. You have been evaluated both medically as well as psychiatrically. Please follow-up with your outpatient resources provided. Return to the emergency department for any worsening symptoms, or any thoughts of hurting yourself or anyone else so that we may attempt to help you. 

## 2022-07-23 NOTE — ED Notes (Signed)
Pt verbalized understanding of discharge orders.  Unable to sign pin  pad at this time d/t malfunction.

## 2022-07-23 NOTE — ED Notes (Signed)
Pt notified that she will be D/C at 0630 so she can make the [employer] bus leaving at 0650 in the morning.

## 2022-10-08 ENCOUNTER — Emergency Department
Admission: EM | Admit: 2022-10-08 | Discharge: 2022-10-08 | Disposition: A | Discharge status: Home / Self Care | Payer: BLUE CROSS/BLUE SHIELD | Attending: Emergency Medicine | Admitting: Emergency Medicine

## 2022-10-08 ENCOUNTER — Emergency Department: Payer: BLUE CROSS/BLUE SHIELD

## 2022-10-08 DIAGNOSIS — T8089XA Other complications following infusion, transfusion and therapeutic injection, initial encounter: Secondary | ICD-10-CM | POA: Insufficient documentation

## 2022-10-08 DIAGNOSIS — T148XXA Other injury of unspecified body region, initial encounter: Secondary | ICD-10-CM

## 2022-10-08 DIAGNOSIS — L03115 Cellulitis of right lower limb: Secondary | ICD-10-CM

## 2022-10-08 DIAGNOSIS — M7981 Nontraumatic hematoma of soft tissue: Secondary | ICD-10-CM | POA: Insufficient documentation

## 2022-10-08 DIAGNOSIS — M79604 Pain in right leg: Secondary | ICD-10-CM | POA: Diagnosis present

## 2022-10-08 MED ORDER — CEPHALEXIN 500 MG PO CAPS
500.0000 mg | ORAL_CAPSULE | Freq: Once | ORAL | Status: AC
  Administered 2022-10-08: 500 mg via ORAL
  Filled 2022-10-08: qty 1

## 2022-10-08 MED ORDER — DOXYCYCLINE MONOHYDRATE 100 MG PO TABS: 100.0000 mg | ORAL_TABLET | Freq: Two times a day (BID) | ORAL | 0 refills | Status: AC

## 2022-10-08 MED ORDER — DOXYCYCLINE HYCLATE 100 MG PO TABS
100.0000 mg | ORAL_TABLET | Freq: Once | ORAL | Status: AC
  Administered 2022-10-08: 100 mg via ORAL
  Filled 2022-10-08: qty 1

## 2022-10-08 MED ORDER — CEPHALEXIN 500 MG PO CAPS: 500.0000 mg | ORAL_CAPSULE | Freq: Four times a day (QID) | ORAL | 0 refills | Status: AC

## 2022-10-08 NOTE — ED Notes (Signed)
US tech at bedside

## 2022-10-08 NOTE — ED Provider Notes (Signed)
Assumed patient care from attending, Harvest Dark.  Venous ultrasound showed no signs of DVT.  On exam, patient had approximately 10 cm x 6 cm of circumferential erythema overlying area of pain..  Patient did have a central region of induration.  I did use a bedside ultrasound and findings are suggestive of cellulitis without visible fluid collection for incision and drainage.  Suspect hematoma with overlying cellulitis.  Patient was given his first dose of Keflex and doxycycline while in the emergency department.  He was discharged with doxycycline and Keflex. All patient questions were answered.    Vallarie Mare Omega, Hershal Coria 10/08/22 2057    Harvest Dark, MD 10/09/22 2244

## 2022-10-08 NOTE — ED Provider Notes (Signed)
   [facility] Provider Note    Event Date/Time   First MD Initiated Contact with Patient 10/08/22 1845     (approximate)  History   Chief Complaint: Leg Pain  HPI  George [name] is a 27 y.o. adult with a past medical history of anxiety, ADHD, PTSD, presents to the emergency department for right leg pain.  According to the patient she gets estrogen injections in her right thigh every 2 weeks.  States during the last injection she developed pain and swelling to the area of the injection that has become painful.  No fever.  Patient states pain is worse with ambulation.  Physical Exam   Triage Vital Signs: ED Triage Vitals  Enc Vitals Group     BP 10/08/22 1818 123/77     Pulse Rate 10/08/22 1818 91     Resp 10/08/22 1818 17     Temp 10/08/22 1818 98.3 F (36.8 C)     Temp Source 10/08/22 1818 Oral     SpO2 10/08/22 1818 98 %     Weight 10/08/22 1819 125 lb 6.4 oz (56.9 kg)     Height --      Head Circumference --      Peak Flow --      Pain Score 10/08/22 1819 7     Pain Loc --      Pain Edu? --      Excl. in [place]? --     Most recent vital signs: Vitals:   10/08/22 1818  BP: 123/77  Pulse: 91  Resp: 17  Temp: 98.3 F (36.8 C)  SpO2: 98%    General: Awake, no distress.  CV:  Good peripheral perfusion.  Resp:  Normal effort.   Abd:  No distention. Other:  Mild swelling to the lateral aspect of the right thigh most consistent with a intramuscular hematoma.  No obvious ecchymosis on the skin.  No calf swelling or tenderness.   RADIOLOGY  Ultrasound pending   MEDICATIONS ORDERED IN ED: Medications - No data to display   IMPRESSION / MDM / [place] / ED COURSE  I reviewed the triage vital signs and the nursing notes.  Patient's presentation is most consistent with acute illness / injury with system symptoms.  Patient presents emergency department for right thigh pain after an injection this past Tuesday.  On examination is  most consistent with a hematoma likely intramuscular hematoma.  However states the pain travels down behind the knee at times as well.  Will obtain an ultrasound as a precaution to rule out DVT.  As long as the ultrasound is negative I discussed supportive care including icing the area for 20 to 30 minutes every couple hours as well as using over-the-counter NSAIDs for pain relief.  Patient agreeable to plan. Ultrasound pending, patient care signed out to oncoming provider.  FINAL CLINICAL IMPRESSION(S) / ED DIAGNOSES   Hematoma  Rx / DC Orders   Supportive care  Note:  This document was prepared using Dragon voice recognition software and may include unintentional dictation errors.   Harvest Dark, MD 10/08/22 [phone]

## 2022-10-08 NOTE — ED Triage Notes (Signed)
Pt sts that she was taking her estrogen shots in the left thigh. Pt sts that she has been having increased pain in the thigh and has not been able to ambulate well.

## 2022-10-08 NOTE — Discharge Instructions (Addendum)
As we discussed please use ice to the area for 20 or 30 minutes every 2-3 hours as needed for discomfort/swelling.  You may also use over-the-counter ibuprofen or Tylenol as needed for discomfort as written on the box.  Return to the emergency department for any symptom personally concerning to yourself. Take doxycycline twice daily for the next 7 days. Take Keflex 4 times daily for the next 7 days.

## 2022-10-10 ENCOUNTER — Other Ambulatory Visit: Payer: Self-pay

## 2022-10-10 ENCOUNTER — Emergency Department
Admission: EM | Admit: 2022-10-10 | Discharge: 2022-10-11 | Disposition: A | Discharge status: Home / Self Care | Payer: BLUE CROSS/BLUE SHIELD | Attending: Emergency Medicine | Admitting: Emergency Medicine

## 2022-10-10 ENCOUNTER — Emergency Department: Payer: BLUE CROSS/BLUE SHIELD

## 2022-10-10 DIAGNOSIS — M79604 Pain in right leg: Secondary | ICD-10-CM | POA: Diagnosis not present

## 2022-10-10 DIAGNOSIS — L03115 Cellulitis of right lower limb: Secondary | ICD-10-CM

## 2022-10-10 LAB — CBC WITH DIFFERENTIAL/PLATELET
Abs Immature Granulocytes: 0.05 10*3/uL (ref 0.00–0.07)
Basophils Absolute: 0.1 10*3/uL (ref 0.0–0.1)
Basophils Relative: 0 %
Eosinophils Absolute: 0.1 10*3/uL (ref 0.0–0.5)
Eosinophils Relative: 1 %
HCT: 37.3 % — ABNORMAL LOW (ref 39.0–52.0)
Hemoglobin: 12.9 g/dL — ABNORMAL LOW (ref 13.0–17.0)
Immature Granulocytes: 0 %
Lymphocytes Relative: 24 %
Lymphs Abs: 3.1 10*3/uL (ref 0.7–4.0)
MCH: 29.6 pg (ref 26.0–34.0)
MCHC: 34.6 g/dL (ref 30.0–36.0)
MCV: 85.6 fL (ref 80.0–100.0)
Monocytes Absolute: 0.9 10*3/uL (ref 0.1–1.0)
Monocytes Relative: 7 %
Neutro Abs: 8.9 10*3/uL — ABNORMAL HIGH (ref 1.7–7.7)
Neutrophils Relative %: 68 %
Platelets: 310 10*3/uL (ref 150–400)
RBC: 4.36 MIL/uL (ref 4.22–5.81)
RDW: 13.1 % (ref 11.5–15.5)
WBC: 13.1 10*3/uL — ABNORMAL HIGH (ref 4.0–10.5)
nRBC: 0 % (ref 0.0–0.2)

## 2022-10-10 LAB — COMPREHENSIVE METABOLIC PANEL
ALT: 11 U/L (ref 0–44)
AST: 16 U/L (ref 15–41)
Albumin: 4.2 g/dL (ref 3.5–5.0)
Alkaline Phosphatase: 59 U/L (ref 38–126)
Anion gap: 9 (ref 5–15)
BUN: 13 mg/dL (ref 6–20)
CO2: 27 mmol/L (ref 22–32)
Calcium: 9.2 mg/dL (ref 8.9–10.3)
Chloride: 97 mmol/L — ABNORMAL LOW (ref 98–111)
Creatinine, Ser: 0.73 mg/dL (ref 0.61–1.24)
GFR, Estimated: >60 mL/min (ref >60)
Glucose, Bld: 84 mg/dL (ref 70–99)
Potassium: 3.8 mmol/L (ref 3.5–5.1)
Sodium: 133 mmol/L — ABNORMAL LOW (ref 135–145)
Total Bilirubin: 0.6 mg/dL (ref 0.3–1.2)
Total Protein: 7.2 g/dL (ref 6.5–8.1)

## 2022-10-10 LAB — SEDIMENTATION RATE: Sed Rate: 27 mm/hr — ABNORMAL HIGH (ref 0–16)

## 2022-10-10 LAB — CK: Total CK: 49 U/L (ref 49–397)

## 2022-10-10 MED ORDER — IBUPROFEN 400 MG PO TABS
400.0000 mg | ORAL_TABLET | Freq: Once | ORAL | Status: AC
  Administered 2022-10-10: 400 mg via ORAL
  Filled 2022-10-10: qty 1

## 2022-10-10 NOTE — ED Provider Notes (Signed)
[facility] Provider Note    Event Date/Time   First MD Initiated Contact with Patient 10/10/22 2139     (approximate)   History   Chief Complaint Leg Pain (right)   HPI George [name] is a 27 y.o. adult, history of anxiety, depression, PTSD, ADHD, gender dysphoria, presents to the emergency department for evaluation of leg pain.  Patient states that she has been experiencing pain in her right thigh extending to her knee for the past few days.  She has been getting estrogen injections in her right thigh for the past 2 weeks and believes this may be related.  She states that she feels like her entire right lower extremity is more swollen than the left.  She seen here initially on 10/08/2022, where she had a negative DVT ultrasound study.  On point-of-care ultrasound there is reportedly some findings suggestive of possible cellulitis around the affected area on her lateral thigh.  She was placed on Keflex and doxycycline.  Patient is noncompliant with her antibiotics, however states that her symptoms are not improving.  She states that she can barely walk.  She presents here today via EMS.  Denies fever/chills, chest pain, shortness of breath, abdominal pain, flank pain, nausea vomit, diarrhea, urinary symptoms, paresthesias, cold sensation, or dizziness/lightheadedness.  History Limitations: No limitations.        Physical Exam  Triage Vital Signs: ED Triage Vitals [10/10/22 2127]  Enc Vitals Group     BP 117/85     Pulse Rate 95     Resp 16     Temp 98 F (36.7 C)     Temp Source Oral     SpO2 98 %     Weight 123 lb 7.3 oz (56 kg)     Height 5' 8"$  (1.727 m)     Head Circumference      Peak Flow      Pain Score 10     Pain Loc      Pain Edu?      Excl. in [place]?     Most recent vital signs: Vitals:   10/10/22 2127  BP: 117/85  Pulse: 95  Resp: 16  Temp: 98 F (36.7 C)  SpO2: 98%    General: Awake, NAD.  Skin: Warm, dry. No rashes or lesions.   Eyes: PERRL. Conjunctivae normal.  CV: Good peripheral perfusion.  Resp: Normal effort.  Abd: Soft, non-tender. No distention.  Neuro: At baseline. No gross neurological deficits.  Musculoskeletal: Normal ROM of all extremities.  Focused Exam: No obvious gross deformities to the right lower extremity.  There does appear to be some mild swelling in the right thigh sitting towards the knee, though no significant discoloration.  Mild ecchymosis present along the lateral aspect of the thigh.  She is exquisitely tender to palpation across the lateral thigh and just superior to the right knee.  PMS intact distally.  She has normal range of motion at the knee, but does endorse significant difficulty with this motion.  PMS intact distally.  Physical Exam    ED Results / Procedures / Treatments  Labs (all labs ordered are listed, but only abnormal results are displayed) Labs Reviewed  CBC WITH DIFFERENTIAL/PLATELET - Abnormal; Notable for the following components:      Result Value   WBC 13.1 (*)    Hemoglobin 12.9 (*)    HCT 37.3 (*)    Neutro Abs 8.9 (*)    All other components within normal  limits  COMPREHENSIVE METABOLIC PANEL - Abnormal; Notable for the following components:   Sodium 133 (*)    Chloride 97 (*)    All other components within normal limits  CK  SEDIMENTATION RATE     EKG N/A.    RADIOLOGY  ED Provider Interpretation: I personally reviewed and interpreted these x-rays, no evidence of acute abnormalities.  DG FEMUR, MIN 2 VIEWS RIGHT  Result Date: 10/10/2022 CLINICAL DATA:  Right thigh pain, initial encounter EXAM: RIGHT FEMUR 2 VIEWS COMPARISON:  None Available. FINDINGS: There is no evidence of fracture or other focal bone lesions. Soft tissues are unremarkable. IMPRESSION: No acute abnormality noted. Electronically Signed   By: Inez Catalina M.D.   On: 10/10/2022 23:12   DG Knee Complete 4 Views Right  Result Date: 10/10/2022 CLINICAL DATA:  Right knee  pain, initial encounter EXAM: RIGHT KNEE - COMPLETE 4+ VIEW COMPARISON:  None Available. FINDINGS: No evidence of fracture, dislocation, or joint effusion. No evidence of arthropathy or other focal bone abnormality. Soft tissues are unremarkable. IMPRESSION: No acute abnormality Electronically Signed   By: Inez Catalina M.D.   On: 10/10/2022 23:11    PROCEDURES:  Critical Care performed: N/A.  Procedures    MEDICATIONS ORDERED IN ED: Medications  ibuprofen (ADVIL) tablet 400 mg (400 mg Oral Given 10/10/22 2309)     IMPRESSION / MDM / ASSESSMENT AND PLAN / ED COURSE  I reviewed the triage vital signs and the nursing notes.                              Differential diagnosis includes, but is not limited to, hematoma, cellulitis, abscess, myositis, necrotizing fasciitis, tibial band syndrome, musculoskeletal strain/sprain  ED Course Patient appears well, vitals within normal limits.  NAD.  CBC shows leukocytosis at 13.1.  Mild anemia present with hemoglobin 12.9.  CMP shows no significant electrolyte abnormalities, AKI, or transaminitis.  CK unremarkable at 49.  Pending sedimentation rate.  Assessment/Plan Patient presents with pain in the right lower extremity x 3 days, particularly along the lateral aspect of the thigh extending towards the superior aspect of the right knee.  She does have some mild swelling and discoloration.  Lab workup shows leukocytosis, otherwise no significant abnormalities.  Her x-rays are negative.  She is exquisitely tender to palpation.  Unclear etiology at this point, however will obtain a CT soft tissue of the right lower extremity with contrast to further evaluate.  Patient care transferred over to Vladimir Crofts, MD at approximately 2345.     FINAL CLINICAL IMPRESSION(S) / ED DIAGNOSES   Final diagnoses:  None     Rx / DC Orders   ED Discharge Orders     None        Note:  This document was prepared using Dragon voice recognition software  and may include unintentional dictation errors.   Teodoro Spray, [place] 10/10/22 [id number]    Vladimir Crofts, MD 10/11/22 [phone]

## 2022-10-10 NOTE — ED Triage Notes (Signed)
First RN Note: pt to ED via [facility] from work. Per EMS pt c/o L thigh pain, per EMS pt is currently in transition and is taking estrogen shots in thigh. Per EMS pt seen previously for abscess where she is doing injections and was sent home with abx, per EMS pt c/o worsening pain to leg at this time   124/81 99% RA 81HR

## 2022-10-10 NOTE — ED Triage Notes (Signed)
Pt to ED via EMS for leg pain. Pt was seen Friday for abscess on leg. All results were normal the patient was placed on PO antibiotics and DC home. Pt was at work today and felt the pain was too unbearable to continue to work and called 911. Pt does not drive, does not have a license. Pt states "I can't drive and felt like coming here so I called 911 to bring me here". Pt is CAOx4 and in no acute distress.

## 2022-10-10 NOTE — ED Notes (Signed)
During triage pt is smiling and laughing and making jokes with staff and is finishing beverage from his dinner that he ordered from work before he got EMS to bring him here.

## 2022-10-11 ENCOUNTER — Emergency Department: Payer: BLUE CROSS/BLUE SHIELD

## 2022-10-11 DIAGNOSIS — M79604 Pain in right leg: Secondary | ICD-10-CM | POA: Diagnosis not present

## 2022-10-11 MED ORDER — IOHEXOL 300 MG/ML  SOLN
100.0000 mL | Freq: Once | INTRAMUSCULAR | Status: AC | PRN
  Administered 2022-10-11: 100 mL via INTRAVENOUS

## 2022-10-11 NOTE — Discharge Instructions (Addendum)
Continue taking your antibiotics as prescribed  Please take Tylenol and ibuprofen/Advil for your pain.  It is safe to take them together, or to alternate them every few hours.  Take up to 10109m of Tylenol at a time, up to 4 times per day.  Do not take more than 4000 mg of Tylenol in 24 hours.  For ibuprofen, take 400-600 mg, 3 - 4 times per day.

## 2023-03-01 ENCOUNTER — Other Ambulatory Visit: Payer: Self-pay

## 2023-03-01 ENCOUNTER — Encounter: Payer: Self-pay | Admitting: *Deleted

## 2023-03-01 ENCOUNTER — Emergency Department
Admission: EM | Admit: 2023-03-01 | Discharge: 2023-03-01 | Disposition: A | Discharge status: Home / Self Care | Payer: BLUE CROSS/BLUE SHIELD | Attending: Emergency Medicine | Admitting: Emergency Medicine

## 2023-03-01 DIAGNOSIS — R110 Nausea: Secondary | ICD-10-CM | POA: Diagnosis present

## 2023-03-01 DIAGNOSIS — E871 Hypo-osmolality and hyponatremia: Secondary | ICD-10-CM | POA: Diagnosis not present

## 2023-03-01 DIAGNOSIS — D72829 Elevated white blood cell count, unspecified: Secondary | ICD-10-CM | POA: Insufficient documentation

## 2023-03-01 LAB — CBC WITH DIFFERENTIAL/PLATELET
Abs Immature Granulocytes: 0.03 10*3/uL (ref 0.00–0.07)
Basophils Absolute: 0 10*3/uL (ref 0.0–0.1)
Basophils Relative: 0 %
Eosinophils Absolute: 0.1 10*3/uL (ref 0.0–0.5)
Eosinophils Relative: 1 %
HCT: 39 % (ref 39.0–52.0)
Hemoglobin: 14.1 g/dL (ref 13.0–17.0)
Immature Granulocytes: 0 %
Lymphocytes Relative: 25 %
Lymphs Abs: 3 10*3/uL (ref 0.7–4.0)
MCH: 29.9 pg (ref 26.0–34.0)
MCHC: 36.2 g/dL — ABNORMAL HIGH (ref 30.0–36.0)
MCV: 82.8 fL (ref 80.0–100.0)
Monocytes Absolute: 0.8 10*3/uL (ref 0.1–1.0)
Monocytes Relative: 6 %
Neutro Abs: 8.3 10*3/uL — ABNORMAL HIGH (ref 1.7–7.7)
Neutrophils Relative %: 68 %
Platelets: 289 10*3/uL (ref 150–400)
RBC: 4.71 MIL/uL (ref 4.22–5.81)
RDW: 12.9 % (ref 11.5–15.5)
WBC: 12.3 10*3/uL — ABNORMAL HIGH (ref 4.0–10.5)
nRBC: 0 % (ref 0.0–0.2)

## 2023-03-01 LAB — COMPREHENSIVE METABOLIC PANEL
ALT: 15 U/L (ref 0–44)
AST: 19 U/L (ref 15–41)
Albumin: 4.8 g/dL (ref 3.5–5.0)
Alkaline Phosphatase: 53 U/L (ref 38–126)
Anion gap: 10 (ref 5–15)
BUN: 11 mg/dL (ref 6–20)
CO2: 23 mmol/L (ref 22–32)
Calcium: 9.2 mg/dL (ref 8.9–10.3)
Chloride: 101 mmol/L (ref 98–111)
Creatinine, Ser: 0.74 mg/dL (ref 0.61–1.24)
GFR, Estimated: >60 mL/min (ref >60)
Glucose, Bld: 94 mg/dL (ref 70–99)
Potassium: 3.7 mmol/L (ref 3.5–5.1)
Sodium: 134 mmol/L — ABNORMAL LOW (ref 135–145)
Total Bilirubin: 0.6 mg/dL (ref 0.3–1.2)
Total Protein: 7.5 g/dL (ref 6.5–8.1)

## 2023-03-01 LAB — CK: Total CK: 59 U/L (ref 49–397)

## 2023-03-01 NOTE — ED Provider Notes (Signed)
[facility] Provider Note  Patient Contact: 9:28 PM (approximate)   History   Heat Exposure   HPI  George [name] is a 27 y.o. adult presents to the emergency department after patient was concerned that he might have become too hot today.  He states that he finished a therapy appointment and was outside walking and walked to [place].  He reports afterwards he felt somewhat nauseous and overheated.  A friend evaluated him and was concerned that he might have a fever.  He denies rhinorrhea, nasal congestion, nonproductive cough, chest pain, abdominal pain, vomiting or diarrhea.      Physical Exam   Triage Vital Signs: ED Triage Vitals  Enc Vitals Group     BP 03/01/23 2052 108/81     Pulse Rate 03/01/23 2052 85     Resp --      Temp 03/01/23 2052 98.9 F (37.2 C)     Temp Source 03/01/23 2052 Oral     SpO2 03/01/23 2052 99 %     Weight --      Height 03/01/23 2053 5' 8.5" (1.74 m)     Head Circumference --      Peak Flow --      Pain Score 03/01/23 2053 1     Pain Loc --      Pain Edu? --      Excl. in [place]? --     Most recent vital signs: Vitals:   03/01/23 2052  BP: 108/81  Pulse: 85  Temp: 98.9 F (37.2 C)  SpO2: 99%     General: Alert and in no acute distress. Eyes:  PERRL. EOMI. Head: No acute traumatic findings ENT:      Nose: No congestion/rhinnorhea.      Mouth/Throat: Mucous membranes are moist. Neck: No stridor. No cervical spine tenderness to palpation. Cardiovascular:  Good peripheral perfusion Respiratory: Normal respiratory effort without tachypnea or retractions. Lungs CTAB. Good air entry to the bases with no decreased or absent breath sounds. Gastrointestinal: Bowel sounds 4 quadrants. Soft and nontender to palpation. No guarding or rigidity. No palpable masses. No distention. No CVA tenderness. Musculoskeletal: Full range of motion to all extremities.  Neurologic:  No gross focal neurologic deficits are appreciated.   Skin:   No rash noted    ED Results / Procedures / Treatments   Labs (all labs ordered are listed, but only abnormal results are displayed) Labs Reviewed  CBC WITH DIFFERENTIAL/PLATELET - Abnormal; Notable for the following components:      Result Value   WBC 12.3 (*)    MCHC 36.2 (*)    Neutro Abs 8.3 (*)    All other components within normal limits  COMPREHENSIVE METABOLIC PANEL - Abnormal; Notable for the following components:   Sodium 134 (*)    All other components within normal limits  CK        PROCEDURES:  Critical Care performed: No  Procedures   MEDICATIONS ORDERED IN ED: Medications - No data to display   IMPRESSION / MDM / ASSESSMENT AND PLAN / ED COURSE  I reviewed the triage vital signs and the nursing notes.                              Assessment and plan Feared Complaint without Diagnosis:  27 year old male presents to the emergency department after patient was outside in the heat today for approximately 1 hour.  Vital  signs were reassuring at triage.  On exam, patient was alert and nontoxic-appearing.   CBC indicates a very mildly elevated white blood cell count.  CMP with very mild hyponatremia.  CK within range.  Recommend hydration at home.  All patient questions were answered.   FINAL CLINICAL IMPRESSION(S) / ED DIAGNOSES   Final diagnoses:  Nausea     Rx / DC Orders   ED Discharge Orders     None        Note:  This document was prepared using Dragon voice recognition software and may include unintentional dictation errors.   Pia Mau [place], PA-C 03/01/23 2258    Pilar Jarvis, MD 03/02/23 [phone]

## 2023-03-01 NOTE — ED Notes (Addendum)
First Nurse note: pt BIB ems with c/o gen weakness that started while pt was outside walking today.  Ems states pt was walking for appx one hour today.  Pt stated her skin felt hot, but was not sweating.  Pt alert and ambulatory on arrival.    EMS VS: 97.8  105HR   112/80 97% RA

## 2023-03-01 NOTE — ED Triage Notes (Signed)
Pt states that she walked in the heat today and feels that she feels that she might have some heat exhaustion.  Fortunately no distress in triage, ambulatory

## 2023-05-07 ENCOUNTER — Emergency Department
Admission: EM | Admit: 2023-05-07 | Discharge: 2023-05-08 | Disposition: A | Discharge status: Other Acute Inpatient Hospital | Payer: BLUE CROSS/BLUE SHIELD | Attending: Emergency Medicine | Admitting: Emergency Medicine

## 2023-05-07 DIAGNOSIS — F331 Major depressive disorder, recurrent, moderate: Secondary | ICD-10-CM | POA: Diagnosis present

## 2023-05-07 DIAGNOSIS — F4310 Post-traumatic stress disorder, unspecified: Secondary | ICD-10-CM | POA: Diagnosis present

## 2023-05-07 DIAGNOSIS — F900 Attention-deficit hyperactivity disorder, predominantly inattentive type: Secondary | ICD-10-CM

## 2023-05-07 DIAGNOSIS — R45851 Suicidal ideations: Secondary | ICD-10-CM | POA: Diagnosis not present

## 2023-05-07 DIAGNOSIS — F909 Attention-deficit hyperactivity disorder, unspecified type: Secondary | ICD-10-CM | POA: Diagnosis not present

## 2023-05-07 DIAGNOSIS — F419 Anxiety disorder, unspecified: Secondary | ICD-10-CM | POA: Diagnosis present

## 2023-05-07 LAB — COMPREHENSIVE METABOLIC PANEL
ALT: 19 U/L (ref 0–44)
AST: 18 U/L (ref 15–41)
Albumin: 3.8 g/dL (ref 3.5–5.0)
Alkaline Phosphatase: 49 U/L (ref 38–126)
Anion gap: 9 (ref 5–15)
BUN: 13 mg/dL (ref 6–20)
CO2: 23 mmol/L (ref 22–32)
Calcium: 8.9 mg/dL (ref 8.9–10.3)
Chloride: 104 mmol/L (ref 98–111)
Creatinine, Ser: 0.72 mg/dL (ref 0.61–1.24)
GFR, Estimated: >60 mL/min (ref >60)
Glucose, Bld: 98 mg/dL (ref 70–99)
Potassium: 4 mmol/L (ref 3.5–5.1)
Sodium: 136 mmol/L (ref 135–145)
Total Bilirubin: 0.4 mg/dL (ref 0.3–1.2)
Total Protein: 6.2 g/dL — ABNORMAL LOW (ref 6.5–8.1)

## 2023-05-07 LAB — SALICYLATE LEVEL: Salicylate Lvl: <7 mg/dL — ABNORMAL LOW (ref 7.0–30.0)

## 2023-05-07 LAB — ACETAMINOPHEN LEVEL: Acetaminophen (Tylenol), Serum: <10 ug/mL — ABNORMAL LOW (ref 10–30)

## 2023-05-07 LAB — CBC
HCT: 41.6 % (ref 39.0–52.0)
Hemoglobin: 15 g/dL (ref 13.0–17.0)
MCH: 31 pg (ref 26.0–34.0)
MCHC: 36.1 g/dL — ABNORMAL HIGH (ref 30.0–36.0)
MCV: 86 fL (ref 80.0–100.0)
Platelets: 356 10*3/uL (ref 150–400)
RBC: 4.84 MIL/uL (ref 4.22–5.81)
RDW: 13.1 % (ref 11.5–15.5)
WBC: 11.7 10*3/uL — ABNORMAL HIGH (ref 4.0–10.5)
nRBC: 0 % (ref 0.0–0.2)

## 2023-05-07 LAB — ETHANOL: Alcohol, Ethyl (B): <10 mg/dL (ref <10)

## 2023-05-07 NOTE — ED Notes (Signed)
Pt dressed out with hospital provided scrubs with this EDT and RN Billy.  Black boots  Blue Leggings  White socks  Pink Bra  White tank top 2 Necklace  2 sliver earrings  2 phones Black watch  Black bag  Underwear  Pt has an ankle monitor on. Pt has charger in top of white Pt belongings.
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
## 2023-05-07 NOTE — ED Notes (Signed)
VOL/pending psych consult

## 2023-05-07 NOTE — ED Notes (Addendum)
Pt phone numbers  [name] [phone] [name] (boss) [phone] [place] [phone] Brett Canales [facility] [place] [employer]) [phone]
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
## 2023-05-07 NOTE — ED Notes (Signed)
This tech obtained vital signs on pt and pt was offered a snack but pt declined.
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
## 2023-05-07 NOTE — ED Notes (Signed)
VOL
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
## 2023-05-08 ENCOUNTER — Encounter (HOSPITAL_COMMUNITY): Payer: Self-pay | Admitting: Psychiatric/Mental Health

## 2023-05-08 ENCOUNTER — Other Ambulatory Visit: Payer: Self-pay

## 2023-05-08 ENCOUNTER — Inpatient Hospital Stay (HOSPITAL_COMMUNITY)
Admission: AD | Admit: 2023-05-08 | Discharge: 2023-05-13 | DRG: 885 | Disposition: A | Discharge status: Home / Self Care | Payer: BLUE CROSS/BLUE SHIELD | Source: Intra-hospital | Attending: Psychiatry | Admitting: Psychiatry

## 2023-05-08 DIAGNOSIS — Z79890 Hormone replacement therapy (postmenopausal): Secondary | ICD-10-CM

## 2023-05-08 DIAGNOSIS — F332 Major depressive disorder, recurrent severe without psychotic features: Secondary | ICD-10-CM | POA: Diagnosis present

## 2023-05-08 DIAGNOSIS — F640 Transsexualism: Secondary | ICD-10-CM | POA: Diagnosis present

## 2023-05-08 DIAGNOSIS — R45851 Suicidal ideations: Secondary | ICD-10-CM | POA: Diagnosis present

## 2023-05-08 DIAGNOSIS — F331 Major depressive disorder, recurrent, moderate: Secondary | ICD-10-CM | POA: Diagnosis not present

## 2023-05-08 DIAGNOSIS — F649 Gender identity disorder, unspecified: Secondary | ICD-10-CM | POA: Diagnosis present

## 2023-05-08 DIAGNOSIS — F909 Attention-deficit hyperactivity disorder, unspecified type: Secondary | ICD-10-CM | POA: Diagnosis present

## 2023-05-08 DIAGNOSIS — F4481 Dissociative identity disorder: Secondary | ICD-10-CM | POA: Diagnosis present

## 2023-05-08 DIAGNOSIS — F32A Depression, unspecified: Secondary | ICD-10-CM | POA: Diagnosis present

## 2023-05-08 DIAGNOSIS — Z79899 Other long term (current) drug therapy: Secondary | ICD-10-CM

## 2023-05-08 DIAGNOSIS — F419 Anxiety disorder, unspecified: Secondary | ICD-10-CM | POA: Diagnosis present

## 2023-05-08 DIAGNOSIS — F4310 Post-traumatic stress disorder, unspecified: Secondary | ICD-10-CM | POA: Diagnosis present

## 2023-05-08 DIAGNOSIS — Z91199 Patient's noncompliance with other medical treatment and regimen due to unspecified reason: Secondary | ICD-10-CM

## 2023-05-08 DIAGNOSIS — Z23 Encounter for immunization: Secondary | ICD-10-CM | POA: Diagnosis not present

## 2023-05-08 MED ORDER — HALOPERIDOL LACTATE 5 MG/ML IJ SOLN: 5.0000 mg | Freq: Three times a day (TID) | INTRAMUSCULAR | Status: DC | PRN

## 2023-05-08 MED ORDER — MAGNESIUM HYDROXIDE 400 MG/5ML PO SUSP: 30.0000 mL | Freq: Every day | ORAL | Status: DC | PRN

## 2023-05-08 MED ORDER — DIPHENHYDRAMINE HCL 25 MG PO CAPS: 50.0000 mg | ORAL_CAPSULE | Freq: Three times a day (TID) | ORAL | Status: DC | PRN

## 2023-05-08 MED ORDER — ESTRADIOL 1 MG PO TABS
2.0000 mg | ORAL_TABLET | Freq: Every day | ORAL | Status: DC
  Administered 2023-05-09 – 2023-05-13 (×5): 2 mg via ORAL
  Filled 2023-05-08 (×9): qty 2

## 2023-05-08 MED ORDER — RISPERIDONE 1 MG PO TABS
1.0000 mg | ORAL_TABLET | Freq: Every day | ORAL | Status: DC
  Administered 2023-05-08: 1 mg via ORAL
  Filled 2023-05-08 (×4): qty 1

## 2023-05-08 MED ORDER — INFLUENZA VIRUS VACC SPLIT PF (FLUZONE) 0.5 ML IM SUSY
0.5000 mL | PREFILLED_SYRINGE | INTRAMUSCULAR | Status: AC
  Administered 2023-05-09: 0.5 mL via INTRAMUSCULAR
  Filled 2023-05-08: qty 0.5

## 2023-05-08 MED ORDER — PROGESTERONE MICRONIZED 100 MG PO CAPS
100.0000 mg | ORAL_CAPSULE | Freq: Every day | ORAL | Status: DC
  Administered 2023-05-08 – 2023-05-12 (×5): 100 mg via ORAL
  Filled 2023-05-08 (×7): qty 1

## 2023-05-08 MED ORDER — ACETAMINOPHEN 325 MG PO TABS: 650.0000 mg | ORAL_TABLET | Freq: Four times a day (QID) | ORAL | Status: DC | PRN

## 2023-05-08 MED ORDER — LORAZEPAM 1 MG PO TABS: 2.0000 mg | ORAL_TABLET | Freq: Three times a day (TID) | ORAL | Status: DC | PRN

## 2023-05-08 MED ORDER — ESTRADIOL 2 MG PO TABS
4.0000 mg | ORAL_TABLET | Freq: Every day | ORAL | Status: DC
  Administered 2023-05-09 – 2023-05-12 (×4): 4 mg via ORAL
  Filled 2023-05-08 (×6): qty 2

## 2023-05-08 MED ORDER — FLUOXETINE HCL 20 MG PO CAPS
20.0000 mg | ORAL_CAPSULE | Freq: Every day | ORAL | Status: DC
  Administered 2023-05-08 – 2023-05-13 (×6): 20 mg via ORAL
  Filled 2023-05-08 (×8): qty 1

## 2023-05-08 MED ORDER — ALUM & MAG HYDROXIDE-SIMETH 200-200-20 MG/5ML PO SUSP: 30.0000 mL | ORAL | Status: DC | PRN

## 2023-05-08 MED ORDER — ESTRADIOL VALERATE 40 MG/ML IM OIL: 40.0000 mg | TOPICAL_OIL | INTRAMUSCULAR | Status: DC

## 2023-05-08 MED ORDER — HALOPERIDOL 5 MG PO TABS: 5.0000 mg | ORAL_TABLET | Freq: Three times a day (TID) | ORAL | Status: DC | PRN

## 2023-05-08 MED ORDER — SPIRONOLACTONE 100 MG PO TABS
100.0000 mg | ORAL_TABLET | Freq: Two times a day (BID) | ORAL | Status: DC
  Administered 2023-05-08 – 2023-05-11 (×6): 100 mg via ORAL
  Filled 2023-05-08 (×10): qty 1

## 2023-05-08 MED ORDER — LORAZEPAM 2 MG/ML IJ SOLN: 2.0000 mg | Freq: Three times a day (TID) | INTRAMUSCULAR | Status: DC | PRN

## 2023-05-08 MED ORDER — DIPHENHYDRAMINE HCL 50 MG/ML IJ SOLN: 50.0000 mg | Freq: Three times a day (TID) | INTRAMUSCULAR | Status: DC | PRN

## 2023-05-08 MED ORDER — EMTRICITABINE-TENOFOVIR DF 200-300 MG PO TABS
1.0000 | ORAL_TABLET | Freq: Every day | ORAL | Status: DC
  Administered 2023-05-08 – 2023-05-13 (×6): 1 via ORAL
  Filled 2023-05-08 (×8): qty 1

## 2023-05-08 NOTE — BH Assessment (Signed)
Patient has been accepted to [facility].  Patient assigned to room 506-01 Accepting physician is Dr. Sherron Flemings.  Call report to [phone].  Representative was Augusto Gamble..   ER Staff is aware of it:  Nitchia, ER Clair Gulling, Patient's Nurse      Address: [address],  [place], [place] [address]

## 2023-05-08 NOTE — Plan of Care (Signed)
Problem: Coping: Goal: Ability to demonstrate self-control will improve Outcome: Progressing   Problem: Health Behavior/Discharge Planning: Goal: Compliance with treatment plan for underlying cause of condition will improve Outcome: Progressing   Problem: Medication: Goal: Compliance with prescribed medication regimen will improve Outcome: Progressing

## 2023-05-08 NOTE — ED Notes (Signed)
VOL/pending psych inpatient admission

## 2023-05-08 NOTE — ED Notes (Signed)
Pt signed e consent to transfer and rider waiver for safe transport.

## 2023-05-08 NOTE — Progress Notes (Signed)
Pt received calm and playing cards with peers. Pt endorses passive SI but admits no plan while here. Pt endorses anxiety and has rapid speech. Pt denies HI, A./ V H at this time. Pt agreeable to nursing assessment but gave minimal answers to assessment questions.

## 2023-05-08 NOTE — ED Notes (Signed)
Per Sharyne Peach "Please call report to [phone] AT time of transport. " Will call report when transport arrives.
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
## 2023-05-08 NOTE — Group Note (Signed)
Date:  05/08/2023 Time:  8:54 PM  Group Topic/Focus:  Wrap-Up Group:   The focus of this group is to help patients review their daily goal of treatment and discuss progress on daily workbooks.    Participation Level:  Active  Participation Quality:  Appropriate  Affect:  Appropriate  Cognitive:  Appropriate  Insight: Appropriate  Engagement in Group:  Engaged  Modes of Intervention:  Education and Exploration  Additional Comments:  Patient attended and participated in group tonight. They reports that today they came into the hospital.  They played cards with peers, went outside and called a friend. The day went well.  Lita Mains [facility] 05/08/2023, 8:54 PM

## 2023-05-09 ENCOUNTER — Encounter (HOSPITAL_COMMUNITY): Payer: Self-pay

## 2023-05-09 DIAGNOSIS — F331 Major depressive disorder, recurrent, moderate: Secondary | ICD-10-CM | POA: Diagnosis not present

## 2023-05-09 MED ORDER — HYDROXYZINE HCL 25 MG PO TABS: 25.0000 mg | ORAL_TABLET | Freq: Three times a day (TID) | ORAL | Status: DC | PRN

## 2023-05-09 MED ORDER — TRAZODONE HCL 50 MG PO TABS: 50.0000 mg | ORAL_TABLET | Freq: Every evening | ORAL | Status: DC | PRN

## 2023-05-09 NOTE — Group Note (Signed)
Date:  05/09/2023 Time:  8:30 PM  Group Topic/Focus:  Wrap-Up Group:   The focus of this group is to help patients review their daily goal of treatment and discuss progress on daily workbooks.    Participation Level:  Active  Participation Quality:  Appropriate  Affect:  Appropriate  Cognitive:  Appropriate  Insight: Appropriate  Engagement in Group:  Engaged  Modes of Intervention:  Education and Exploration  Additional Comments:  Patient attended and participated in group tonight. She reports that what give her peace is her support system.  Lita Mains [facility] 05/09/2023, 8:30 PM
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
## 2023-05-09 NOTE — Plan of Care (Signed)
  Problem: Education: Goal: Emotional status will improve Outcome: Progressing Goal: Mental status will improve Outcome: Progressing   Problem: Activity: Goal: Interest or engagement in activities will improve Outcome: Progressing Goal: Sleeping patterns will improve Outcome: Progressing   Problem: Safety: Goal: Periods of time without injury will increase Outcome: Progressing   

## 2023-05-09 NOTE — Progress Notes (Signed)
   05/09/23 0802  Psych Admission Type (Psych Patients Only)  Admission Status Voluntary  Psychosocial Assessment  Patient Complaints None  Eye Contact Fair  Facial Expression Animated  Affect Appropriate to circumstance  Speech Rapid  Interaction Attention-seeking  Motor Activity Fidgety  Appearance/Hygiene Unremarkable  Behavior Characteristics Hyperactive  Mood Pleasant  Thought Process  Coherency WDL  Content Magical thinking  Delusions Religious  Perception WDL  Hallucination None reported or observed  Judgment Poor  Confusion None  Danger to Self  Current suicidal ideation? Denies  Agreement Not to Harm Self Yes  Description of Agreement verbal  Danger to Others  Danger to Others None reported or observed

## 2023-05-09 NOTE — Plan of Care (Signed)
  Problem: Education: Goal: Emotional status will improve Outcome: Progressing Goal: Mental status will improve Outcome: Progressing   Problem: Activity: Goal: Interest or engagement in activities will improve Outcome: Progressing Goal: Sleeping patterns will improve Outcome: Progressing   Problem: Health Behavior/Discharge Planning: Goal: Identification of resources available to assist in meeting health care needs will improve Outcome: Progressing Goal: Compliance with treatment plan for underlying cause of condition will improve Outcome: Progressing   Problem: Safety: Goal: Periods of time without injury will increase 05/09/2023 2128 by Delos Haring, RN Outcome: Progressing 05/09/2023 2127 by Delos Haring, RN Outcome: Progressing   Problem: Education: Goal: Will be free of psychotic symptoms Outcome: Progressing Goal: Knowledge of the prescribed therapeutic regimen will improve Outcome: Progressing   Problem: Coping: Goal: Coping ability will improve Outcome: Progressing Goal: Will verbalize feelings Outcome: Progressing   Problem: Health Behavior/Discharge Planning: Goal: Compliance with prescribed medication regimen will improve Outcome: Progressing

## 2023-05-09 NOTE — Progress Notes (Signed)
Recreation Therapy Notes  INPATIENT RECREATION THERAPY ASSESSMENT  Patient Details Name: George [name] MRN: [id number] DOB: 1996/02/15 Today's Date: 05/09/2023       Information Obtained From: Patient  Able to Participate in Assessment/Interview: Yes  Patient Presentation: Alert  Reason for Admission (Per Patient): Suicidal Ideation  Patient Stressors: Other (Comment) (yelling, loud noises, being demanded to do things and being touched wihout consent)  Coping Skills:   Music  Leisure Interests (2+):  Social - Family, Individual - Other (Comment), Music - Listen (Animals)  Frequency of Recreation/Participation: Other (Comment) (Daily)  Awareness of Community Resources:  Yes  Community Resources:  [place], [place] [facility], [place]  Current Use:  (Pt stated "yes and no".)  If no, Barriers?: Other (Comment) (Pt stated he can't go to certain places due to ankle bracelet.)  Expressed Interest in [employer] Information: No  [place]idence[place]  Patient Main Form of Transportation: Other (Comment) ("varies")  Patient Strengths:  Good at reading people, cooking, great with animals  Patient Identified Areas of Improvement:  Getting rid of depression, suicidal ideation  Patient Goal for Hospitalization:  "to get rid of depression, suicidal ideation but that ain't going to happen"  Current SI (including self-harm):  Yes (Rated a 5 out of 10; contracts for safety)  Current HI:  No  Current AVH: Yes ("hear and see alters")  Staff Intervention Plan: Group Attendance, Collaborate with Interdisciplinary Treatment Team  Consent to Intern Participation: N/A   Kingsley Herandez-McCall, LRT,CTRS Allure Greaser A Costantino Kohlbeck-McCall 05/09/2023, 1:38 PM

## 2023-05-09 NOTE — Progress Notes (Signed)
   05/09/23 2100  Psych Admission Type (Psych Patients Only)  Admission Status Voluntary  Psychosocial Assessment  Patient Complaints None  Eye Contact Fair  Facial Expression Animated  Affect Appropriate to circumstance  Speech Rapid;Logical/coherent  Interaction Attention-seeking;Assertive  Motor Activity Fidgety  Appearance/Hygiene Unremarkable  Behavior Characteristics Cooperative;Hyperactive  Mood Pleasant;Anxious  Aggressive Behavior  Effect No apparent injury  Thought Process  Coherency WDL  Content Magical thinking  Delusions Religious  Perception WDL  Hallucination None reported or observed  Judgment Poor  Confusion WDL  Danger to Self  Current suicidal ideation? Denies  Danger to Others  Danger to Others None reported or observed

## 2023-05-09 NOTE — Group Note (Signed)
LCSW Group Therapy Note    Group Date: 05/09/2023 Start Time: 1300 End Time: 1400   Type of Therapy and Topic: Group Therapy: Body Image  Participation Level:  Active  Description of Group:  Patients were educated about body image and asked to think about whether they have a healthy or unhealthy body image. Patients were led in a discussion about factors that contribute to body image, both internal and external. Patients were asked to discuss strengths of the human body outside of appearance, such as being able to fight off diseases and provide stress relief. Lastly, patients were asked to identify one way in which they appreciate their own body outside of appearance.   Therapeutic Goals:   1. Patient will differentiate between a healthy and unhealthy body image. 2. Patient will identify what contributes to body image 3. Patient will discuss the strengths of the human body. 4. Patient will identify a positive attribute of their body outside of physical appearance.  Summary of Patient Progress:  Patient actively engaged in processing and exploring how they feel about their body image. Patient proved open to input from peers and feedback from CSW. Patient demonstrated open and honest insight into the subject matter, was respectful and supportive of peers, and participated throughout the entire session.  Therapeutic Modalities: Cognitive Behavioral Therapy; Solution-Focused Therapy  George [name] 05/09/2023  2:32 PM

## 2023-05-09 NOTE — BH IP Treatment Plan (Signed)
1Interdisciplinary Treatment and Diagnostic Plan Update  05/09/2023 Time of Session: 11:55AM George [name] MRN: [id number]  Principal Diagnosis: Depression  Secondary Diagnoses: Principal Problem:   Depression Active Problems:   Suicidal ideation   Anxiety   PTSD (post-traumatic stress disorder)   ADHD   Current Medications:  Current Facility-Administered Medications  Medication Dose Route Frequency Provider Last Rate Last Admin   acetaminophen (TYLENOL) tablet 650 mg  650 mg Oral Q6H PRN Penn, Cicely, NP       alum & mag hydroxide-simeth (MAALOX/MYLANTA) 200-200-20 MG/5ML suspension 30 mL  30 mL Oral Q4H PRN Penn, Cicely, NP       diphenhydrAMINE (BENADRYL) capsule 50 mg  50 mg Oral TID PRN Mcneil Sober, NP       Or   diphenhydrAMINE (BENADRYL) injection 50 mg  50 mg Intramuscular TID PRN Penn, Cranston Neighbor, NP       emtricitabine-tenofovir (TRUVADA) 200-300 MG per tablet 1 tablet  1 tablet Oral Daily Hill, Shelbie Hutching, MD   1 tablet at 05/09/23 0802   estradiol (ESTRACE) tablet 2 mg  2 mg Oral Daily Otho Bellows, RPH   2 mg at 05/09/23 6578   And   estradiol (ESTRACE) tablet 4 mg  4 mg Oral QHS Green, Terri L, RPH       FLUoxetine (PROZAC) capsule 20 mg  20 mg Oral Daily Hill, Shelbie Hutching, MD   20 mg at 05/09/23 0802   haloperidol (HALDOL) tablet 5 mg  5 mg Oral TID PRN Mcneil Sober, NP       Or   haloperidol lactate (HALDOL) injection 5 mg  5 mg Intramuscular TID PRN Penn, Cranston Neighbor, NP       LORazepam (ATIVAN) tablet 2 mg  2 mg Oral TID PRN Mcneil Sober, NP       Or   LORazepam (ATIVAN) injection 2 mg  2 mg Intramuscular TID PRN Penn, Cranston Neighbor, NP       magnesium hydroxide (MILK OF MAGNESIA) suspension 30 mL  30 mL Oral Daily PRN Penn, Cicely, NP       progesterone (PROMETRIUM) capsule 100 mg  100 mg Oral QHS Hill, Shelbie Hutching, MD   100 mg at 05/08/23 2031   risperiDONE (RISPERDAL) tablet 1 mg  1 mg Oral QHS Hill, Shelbie Hutching, MD   1 mg at 05/08/23 2032    spironolactone (ALDACTONE) tablet 100 mg  100 mg Oral BID Roselle Locus, MD   100 mg at 05/09/23 0802   PTA Medications: Medications Prior to Admission  Medication Sig Dispense Refill Last Dose   estradiol (ESTRACE) 1 MG tablet Take 2-4 mg by mouth 2 (two) times daily.      emtricitabine-tenofovir (TRUVADA) 200-300 MG tablet Take 1 tablet by mouth daily.      FLUoxetine (PROZAC) 20 MG capsule Take 1 capsule (20 mg total) by mouth daily. (Patient not taking: Reported on 07/22/2022) 30 capsule 2    progesterone (PROMETRIUM) 100 MG capsule Take 100 mg by mouth at bedtime.      risperiDONE (RISPERDAL) 2 MG tablet Take 0.5 tablets (1 mg total) by mouth at bedtime. (Patient not taking: Reported on 07/22/2022) 30 tablet 2    spironolactone (ALDACTONE) 50 MG tablet Take 100 mg by mouth 2 (two) times daily. Takes 200 qhs       Patient Stressors: Marital or family conflict   Traumatic event    Patient Strengths: Active sense of humor  Average or above average intelligence  Capable of  independent living  General fund of knowledge  Supportive family/friends   Treatment Modalities: Medication Management, Group therapy, Case management,  1 to 1 session with clinician, Psychoeducation, Recreational therapy.   Physician Treatment Plan for Primary Diagnosis: Depression Long Term Goal(s): Improvement in symptoms so as ready for discharge   Short Term Goals: Ability to identify changes in lifestyle to reduce recurrence of condition will improve Ability to verbalize feelings will improve Ability to disclose and discuss suicidal ideas Ability to demonstrate self-control will improve Ability to identify and develop effective coping behaviors will improve Ability to maintain clinical measurements within normal limits will improve Compliance with prescribed medications will improve Ability to identify triggers associated with substance abuse/mental health issues will improve  Medication  Management: Evaluate patient's response, side effects, and tolerance of medication regimen.  Therapeutic Interventions: 1 to 1 sessions, Unit Group sessions and Medication administration.  Evaluation of Outcomes: Not Progressing  Physician Treatment Plan for Secondary Diagnosis: Principal Problem:   Depression Active Problems:   Suicidal ideation   Anxiety   PTSD (post-traumatic stress disorder)   ADHD  Long Term Goal(s): Improvement in symptoms so as ready for discharge   Short Term Goals: Ability to identify changes in lifestyle to reduce recurrence of condition will improve Ability to verbalize feelings will improve Ability to disclose and discuss suicidal ideas Ability to demonstrate self-control will improve Ability to identify and develop effective coping behaviors will improve Ability to maintain clinical measurements within normal limits will improve Compliance with prescribed medications will improve Ability to identify triggers associated with substance abuse/mental health issues will improve     Medication Management: Evaluate patient's response, side effects, and tolerance of medication regimen.  Therapeutic Interventions: 1 to 1 sessions, Unit Group sessions and Medication administration.  Evaluation of Outcomes: Not Progressing   RN Treatment Plan for Primary Diagnosis: Depression Long Term Goal(s): Knowledge of disease and therapeutic regimen to maintain health will improve  Short Term Goals: Ability to remain free from injury will improve, Ability to verbalize frustration and anger appropriately will improve, Ability to participate in decision making will improve, Ability to verbalize feelings will improve, Ability to identify and develop effective coping behaviors will improve, and Compliance with prescribed medications will improve  Medication Management: RN will administer medications as ordered by provider, will assess and evaluate patient's response and  provide education to patient for prescribed medication. RN will report any adverse and/or side effects to prescribing provider.  Therapeutic Interventions: 1 on 1 counseling sessions, Psychoeducation, Medication administration, Evaluate responses to treatment, Monitor vital signs and CBGs as ordered, Perform/monitor CIWA, COWS, AIMS and Fall Risk screenings as ordered, Perform wound care treatments as ordered.  Evaluation of Outcomes: Not Progressing   LCSW Treatment Plan for Primary Diagnosis: Depression Long Term Goal(s): Safe transition to appropriate next level of care at discharge, Engage patient in therapeutic group addressing interpersonal concerns.  Short Term Goals: Engage patient in aftercare planning with referrals and resources, Increase social support, Increase emotional regulation, Facilitate acceptance of mental health diagnosis and concerns, Identify triggers associated with mental health/substance abuse issues, and Increase skills for wellness and recovery  Therapeutic Interventions: Assess for all discharge needs, 1 to 1 time with Social worker, Explore available resources and support systems, Assess for adequacy in community support network, Educate family and significant other(s) on suicide prevention, Complete Psychosocial Assessment, Interpersonal group therapy.  Evaluation of Outcomes: Not Progressing   Progress in Treatment: Attending groups: Yes. Participating in groups: Yes. Taking medication  as prescribed: Yes. Toleration medication: Yes. Family/Significant other contact made: No, will contact:  [name] ( boss) [phone] Patient understands diagnosis: No. Discussing patient identified problems/goals with staff: Yes. Medical problems stabilized or resolved: No. Denies suicidal/homicidal ideation: Yes. Issues/concerns per patient self-inventory: No.  New problem(s) identified: No, Describe:  none reported  New Short Term/Long Term Goal(s):medication  stabilization, elimination of SI thoughts, development of comprehensive mental wellness plan.    Patient Goals:  "work on my depression"  Discharge Plan or Barriers: Patient recently admitted. CSW will continue to follow and assess for appropriate referrals and possible discharge planning.    Reason for Continuation of Hospitalization: Depression Medication stabilization Suicidal ideation  Estimated Length of Stay:5-7 days  Last 3 [place] Suicide Severity Risk Score: Flowsheet Row Admission (Current) [facility] from 05/07/2023 in [facility] Department at [facility] ED from 03/01/2023 in [facility] Department at [facility]  C-SSRS RISK CATEGORY High Risk High Risk No Risk       Last PHQ 2/9 Scores:     No data to display          Scribe for Treatment Team: Izell Cleburne, LCSW 05/09/2023 2:09 PM
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
## 2023-05-09 NOTE — Group Note (Signed)
Recreation Therapy Group Note   Group Topic:Coping Skills  Group Date: 05/09/2023 Start Time: 1010 End Time: 1040 Facilitators: Sydnei Ohaver-McCall, LRT,CTRS Location: 500 Hall Dayroom   Goal Area(s) Addresses:  Patient will effectively work with peer towards shared goal.  Patient will identify skills used to make activity successful.  Patient will identify how skills used during activity can be applied to reach post d/c goals.   Intervention: STEM Activity- Glass blower/designer  Group Description: Tallest Pharmacist, community. In teams of 5-6, patients were given 11 craft pipe cleaners. Using the materials provided, patients were instructed to compete again the opposing team(s) to build the tallest free-standing structure from floor level. The activity was timed; difficulty increased by Clinical research associate as Production designer, theatre/television/film continued.  Systematically resources were removed with additional directions for example, placing one arm behind their back, working in silence, and shape stipulations. LRT facilitated post-activity discussion reviewing team processes and necessary communication skills involved in completion. Patients were encouraged to reflect how the skills utilized, or not utilized, in this activity can be incorporated to positively impact support systems post discharge.   Affect/Mood: Appropriate   Participation Level: Minimal   Participation Quality: Independent   Behavior: Attentive    Speech/Thought Process: Relevant   Insight: Good   Judgement: Good   Modes of Intervention: Worksheet   Patient Response to Interventions:  Attentive   Education Outcome:  In group clarification offered    Clinical Observations/Individualized Feedback: Pt came in as group was processing pt responses to the challenges they were assigned. Pt offered minimal suggestions but did engage with peers.   Plan: Continue to engage patient in RT group sessions 2-3x/week.   Florabelle Cardin-McCall,  LRT,CTRS 05/09/2023 1:02 PM

## 2023-05-10 DIAGNOSIS — F331 Major depressive disorder, recurrent, moderate: Secondary | ICD-10-CM | POA: Diagnosis not present

## 2023-05-10 DIAGNOSIS — F332 Major depressive disorder, recurrent severe without psychotic features: Principal | ICD-10-CM | POA: Diagnosis present

## 2023-05-10 LAB — LIPID PANEL
Cholesterol: 193 mg/dL (ref 0–200)
HDL: 44 mg/dL (ref >40)
LDL Cholesterol: 134 mg/dL — ABNORMAL HIGH (ref 0–99)
Total CHOL/HDL Ratio: 4.4 ratio
Triglycerides: 77 mg/dL (ref <150)
VLDL: 15 mg/dL (ref 0–40)

## 2023-05-10 NOTE — Plan of Care (Signed)
  Problem: Education: Goal: Emotional status will improve Outcome: Progressing Goal: Mental status will improve Outcome: Progressing   Problem: Activity: Goal: Interest or engagement in activities will improve Outcome: Progressing Goal: Sleeping patterns will improve Outcome: Progressing   Problem: Coping: Goal: Ability to verbalize frustrations and anger appropriately will improve Outcome: Progressing Goal: Ability to demonstrate self-control will improve Outcome: Progressing   Problem: Physical Regulation: Goal: Ability to maintain clinical measurements within normal limits will improve Outcome: Progressing   Problem: Safety: Goal: Periods of time without injury will increase Outcome: Progressing   Problem: Education: Goal: Knowledge of the prescribed therapeutic regimen will improve Outcome: Progressing   Problem: Coping: Goal: Coping ability will improve Outcome: Progressing

## 2023-05-10 NOTE — Progress Notes (Signed)
Pt has been appropriate on the unit, pt appears to try to interact with peers, but may benefit from interaction with patients on other halls     05/10/23 2145  Psych Admission Type (Psych Patients Only)  Admission Status Voluntary  Psychosocial Assessment  Patient Complaints None  Eye Contact Fair  Facial Expression Animated  Affect Appropriate to circumstance  Speech Rapid;Logical/coherent  Interaction Attention-seeking;Assertive  Motor Activity Fidgety  Appearance/Hygiene Unremarkable  Behavior Characteristics Cooperative  Mood Anxious;Pleasant  Aggressive Behavior  Effect No apparent injury  Thought Process  Coherency WDL  Content Magical thinking  Delusions Religious  Perception WDL  Hallucination None reported or observed  Judgment Poor  Confusion WDL  Danger to Self  Current suicidal ideation? Denies  Danger to Others  Danger to Others None reported or observed

## 2023-05-10 NOTE — Plan of Care (Addendum)
Problem: Safety: Goal: Periods of time without injury will increase Outcome: Progressing   Problem: Medication: Goal: Compliance with prescribed medication regimen will improve Outcome: Progressing   Problem: Self-Care: Goal: Ability to participate in self-care as condition permits will improve Outcome: Not Progressing   Pt visible in dayroom majority of this shift. Denies SI, HI, VH and pain when assessed. Observed with fair eye contact,  circumstantial / hyper-focused on interactions about her ulta ego. Rated her anxiety 5/10 and depression 3/10 "I'm fine, I always hear voices of my ulta, it's nothing new that I can't handle". Minimal with staff but forward on interactions. Safety checks maintained at Q 15 minutes intervals without incident. Remains medication compliant. Attended scheduled groups. Off unit for meals, returned without issues. Tolerates meals and fluids well. Denies concerns at this time.
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
## 2023-05-10 NOTE — Group Note (Signed)
Date:  05/10/2023 Time:  9:53 AM  Group Topic/Focus:  Goals Group:   The focus of this group is to help patients establish daily goals to achieve during treatment and discuss how the patient can incorporate goal setting into their daily lives to aide in recovery.    Participation Level:  Active  Participation Quality:  Appropriate  Affect:  Appropriate  Cognitive:  Appropriate  Insight: Appropriate  Engagement in Group:  Engaged  Modes of Intervention:  Discussion  Additional Comments:    Beckie Busing 05/10/2023, 9:53 AM

## 2023-05-10 NOTE — Group Note (Deleted)
Recreation Therapy Group Note   Group Topic:Animal Assisted Therapy   Group Date: 05/10/2023 Start Time: 0950 End Time: 1035 Facilitators: Oakleigh Hesketh, Benito Mccreedy, LRT Location: [address] Dayroom  Animal-Assisted Activity (AAA) Program Checklist/Progress Note Patient Eligibility Criteria Checklist & Daily Group note for Rec Tx Intervention   AAA/T Program Assumption of Risk Form signed by Patient/ or Parent Legal Guardian YES  Patient is free of allergies or severe asthma  YES  Patient reports no fear of animals YES  Patient reports no history of cruelty to animals YES  Patient understands their participation is voluntary YES  Patient washes hands before animal contact YES  Patient washes hands after animal contact YES   Group Description: Patients provided opportunity to interact with trained and credentialed [employer] dog and the community volunteer/dog handler. Patients practiced appropriate animal interaction and were educated on dog safety outside of the hospital in common community settings. Patients were allowed to use dog toys and other items to practice commands, engage the dog in play, and/or complete routine aspects of animal care.   Education: Charity fundraiser, Health visitor, Communication & Social Skills     Affect/Mood: {RT BHH Affect/Mood:26271}   Participation Level: {RT BHH Participation [employer]   Participation Quality: {RT BHH Participation Quality:26268}   Behavior: {RT BHH Group Behavior:26269}   Speech/Thought Process: {RT BHH Speech/Thought:26276}   Insight: {RT BHH Insight:26272}   Judgement: {RT BHH Judgement:26278}   Modes of Intervention: {RT BHH Modes of Intervention:26277}   Patient Response to Interventions:  {RT BHH Patient Response to Intervention:26274}   Education Outcome:  {RT BHH Education Outcome:26279}   Clinical Observations/Individualized Feedback: *** was *** in their participation of session  activities and group discussion. Pt identified ***   Plan: {RT BHH Tx YQMV:78469}   Benito Mccreedy Annikah Lovins, LRT,  05/10/2023 11:46 AM

## 2023-05-10 NOTE — Group Note (Signed)
Date:  05/10/2023 Time:  8:47 PM  Group Topic/Focus:  Wrap-Up Group:   The focus of this group is to help patients review their daily goal of treatment and discuss progress on daily workbooks.    Participation Level:  Active  Participation Quality:  Appropriate  Affect:  Appropriate  Cognitive:  Appropriate  Insight: Appropriate  Engagement in Group:  Engaged  Modes of Intervention:  Education and Exploration  Additional Comments:  Patient attended and participated in group tonight. She reports that her day was semi-eventful. She participated in a fire drill today which was not pleasant.  Lita Mains [facility] 05/10/2023, 8:47 PM

## 2023-05-11 DIAGNOSIS — F331 Major depressive disorder, recurrent, moderate: Secondary | ICD-10-CM | POA: Diagnosis not present

## 2023-05-11 MED ORDER — SPIRONOLACTONE 100 MG PO TABS
200.0000 mg | ORAL_TABLET | Freq: Every day | ORAL | Status: DC
  Administered 2023-05-12: 200 mg via ORAL
  Filled 2023-05-11 (×3): qty 2
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
## 2023-05-11 NOTE — Plan of Care (Signed)
Problem: Activity: Goal: Interest or engagement in activities will improve Outcome: Progressing   Problem: Safety: Goal: Periods of time without injury will increase Outcome: Progressing   Problem: Education: Goal: Will be free of psychotic symptoms Outcome: Progressing   Pt A & O X4. Denies SI, HI, AVH and pain. However, Pt reported "I still see my alters, they keep me safe". Reports she slept well last night with good appetite. Behavioral outburst X1 at the beginning of the shift as pt could not charge ankle monitor in the dayroom because there was not a staff available to sit with pt at the time "I don't understand why y'all can't open the damn door. It's not my fault you are understaffed". Verbally redirectable, educated on safety of staff presence in dayroom. Pt visible in dayroom majority of this shift for scheduled groups, engaged appropriately in discussions. Remains medication compliant without adverse drug reactions. Safety checks maintained at Q 15 minutes intervals. Pt off unit to cafeteria for lunch, returned without issues. Tolerates meals and fluids well. Denies concerns at this time.

## 2023-05-11 NOTE — Progress Notes (Signed)
Pt  stated she takes her Spirolactone at night 1x instead of BID , pt encouraged to talk to the doctor

## 2023-05-11 NOTE — Progress Notes (Signed)
   05/11/23 0601  15 Minute Checks  Location Bedroom  Visual Appearance Calm  Behavior Composed  Sleep (Behavioral Health Patients Only)  Calculate sleep? (Click Yes once per 24 hr at 0600 safety check) Yes  Documented sleep last 24 hours 7.25

## 2023-05-11 NOTE — Group Note (Signed)
Recreation Therapy Group Note   Group Topic:Leisure Education  Group Date: 05/11/2023 Start Time: 1030 End Time: 1105 Facilitators: Shikara Mcauliffe-McCall, LRT,CTRS Location: [address] Hall Dayroom   Group Topic: Leisure Education   Goal Area(s) Addresses:  Patient will successfully identify positive leisure and recreation activities.  Patient will acknowledge benefits of participation in healthy leisure activities post discharge.  Patient will actively work with peers toward a shared goal.   Intervention: Group Game    Group Description: Music Trivia. LRT asked patients music questions about oldies but goodies and 90s, 2000s hip hop and r&b. In one group, patients worked together to figure out the next lyric to the songs presented in group. The activity was also used to show patients that leisure didn't have to be an expensive activity and can be enjoyed as a group as well as individually.    Education:  Teacher, English as a foreign language, Leisure as Merchant navy officer, Programmer, applications, Building control surveyor   Education Outcome: Acknowledges education/In group clarification offered/Needs additional education   Affect/Mood: Appropriate   Participation Level: Engaged   Participation Quality: Independent   Behavior: Appropriate   Speech/Thought Process: Focused   Insight: Good   Judgement: Good   Modes of Intervention: Cooperative Play   Patient Response to Interventions:  Engaged   Education Outcome:  In group clarification offered    Clinical Observations/Individualized Feedback: Pt was engaging and active during group session. Pt had a hard time guessing some of he lyrics due to not knowing a lot of the songs and artists. Pt was successful in getting a number the answers correct.      Plan: Continue to engage patient in RT group sessions 2-3x/week.   Onell Mcmath-McCall, LRT,CTRS 05/11/2023 1:32 PM

## 2023-05-12 DIAGNOSIS — F331 Major depressive disorder, recurrent, moderate: Secondary | ICD-10-CM | POA: Diagnosis not present

## 2023-05-12 MED ORDER — HYDROCORTISONE 1 % EX CREA
TOPICAL_CREAM | Freq: Two times a day (BID) | CUTANEOUS | Status: DC
  Filled 2023-05-12 (×2): qty 28

## 2023-05-12 NOTE — Progress Notes (Signed)
   05/11/23 2100  Psych Admission Type (Psych Patients Only)  Admission Status Voluntary  Psychosocial Assessment  Patient Complaints None  Eye Contact Fair  Facial Expression Animated  Affect Appropriate to circumstance  Speech Logical/coherent;Rapid  Interaction Assertive  Motor Activity Fidgety  Appearance/Hygiene Unremarkable  Behavior Characteristics Cooperative;Hyperactive  Mood Anxious;Pleasant  Thought Process  Coherency WDL  Content WDL  Delusions None reported or observed  Perception WDL  Hallucination None reported or observed  Judgment Poor  Confusion None  Danger to Self  Current suicidal ideation? Denies  Danger to Others  Danger to Others None reported or observed

## 2023-05-12 NOTE — Progress Notes (Signed)
Pt participating in groups. Pt denies SI/HI. Pt reports " I only hear my alters." Pt presents with animated affect. Q 15 minute checks ongoing.

## 2023-05-12 NOTE — Group Note (Signed)
LCSW Group Therapy Note   Group Date: 05/12/2023 Start Time: 1100 End Time: 1200   LCSW Group Therapy Note     05/12/2023 1:02 PM     Type of Therapy and Topic:  Group Therapy:  Strengths     Participation Level:  Active     Description of Group: In this group patients will be encouraged to explore personal strengths that are conducive to recovery and well-being. They will be guided to discuss their thoughts, feelings, and behaviors related to these strengths. The group will process together ways that individualized strengths help patients build resilience and facilitate positive treatment outcomes. Each patient will be challenged to identify personal strengths that they foster as well as positive characteristics they would like to embody as they progress through treatment.This group will be process-oriented, with patients participating in exploration of their own experiences as well as giving and receiving support and challenge from other group members.     Therapeutic Goals:  1.    Patient will identify a collaborative list of positive characteristics that promote positive treatment outcomes and well-being.  2.    Patient will identify three personal strengths that they currently exemplify.  3.    Patient will identify feelings, thought process and behaviors related to these strengths.  4.    Patient will identify two ways they will use these personal strengths to help them reach their individualized treatment goals.         Summary of Patient Progress Pt was active in group          Therapeutic Modalities:    Cognitive Behavioral Therapy  Solution Focused Therapy  Motivational Interviewing   Izell [place], Theresia Majors 05/12/2023  1:02 PM
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
## 2023-05-12 NOTE — Progress Notes (Signed)
[facility] LCSW Progress Note   05/12/2023    1:03 PM  George [name]      Type of Note: 2nd Attempt call for Collateral with [name]  CSW had to leave another voicemail for patient boss, to make sure that their are no safety concerns and that patient can return back to her home. CSW will continue to assist.     Signed:   Jacob Moores, MSW, [facility] 05/12/2023 1:03 PM
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
## 2023-05-12 NOTE — Plan of Care (Signed)
Problem: Education: Goal: Knowledge of [facility] Education information/materials will improve Outcome: Progressing Goal: Emotional status will improve Outcome: Progressing Goal: Mental status will improve Outcome: Progressing Goal: Verbalization of understanding the information provided will improve Outcome: Progressing   Problem: Activity: Goal: Interest or engagement in activities will improve Outcome: Progressing Goal: Sleeping patterns will improve Outcome: Progressing   Problem: Coping: Goal: Ability to verbalize frustrations and anger appropriately will improve Outcome: Progressing Goal: Ability to demonstrate self-control will improve Outcome: Progressing   Problem: Health Behavior/Discharge Planning: Goal: Identification of resources available to assist in meeting health care needs will improve Outcome: Progressing Goal: Compliance with treatment plan for underlying cause of condition will improve Outcome: Progressing   Problem: Physical Regulation: Goal: Ability to maintain clinical measurements within normal limits will improve Outcome: Progressing   Problem: Safety: Goal: Periods of time without injury will increase Outcome: Progressing   Problem: Activity: Goal: Will verbalize the importance of balancing activity with adequate rest periods Outcome: Progressing   Problem: Education: Goal: Will be free of psychotic symptoms Outcome: Progressing Goal: Knowledge of the prescribed therapeutic regimen will improve Outcome: Progressing   Problem: Coping: Goal: Coping ability will improve Outcome: Progressing Goal: Will verbalize feelings Outcome: Progressing   Problem: Health Behavior/Discharge Planning: Goal: Compliance with prescribed medication regimen will improve Outcome: Progressing   Problem: Nutritional: Goal: Ability to achieve adequate nutritional intake will improve Outcome: Progressing   Problem: Role Relationship: Goal:  Ability to communicate needs accurately will improve Outcome: Progressing Goal: Ability to interact with others will improve Outcome: Progressing   Problem: Safety: Goal: Ability to redirect hostility and anger into socially appropriate behaviors will improve Outcome: Progressing Goal: Ability to remain free from injury will improve Outcome: Progressing   Problem: Self-Care: Goal: Ability to participate in self-care as condition permits will improve Outcome: Progressing   Problem: Self-Concept: Goal: Will verbalize positive feelings about self Outcome: Progressing   Problem: Education: Goal: Ability to make informed decisions regarding treatment will improve Outcome: Progressing   Problem: Coping: Goal: Coping ability will improve Outcome: Progressing   Problem: Health Behavior/Discharge Planning: Goal: Identification of resources available to assist in meeting health care needs will improve Outcome: Progressing   Problem: Medication: Goal: Compliance with prescribed medication regimen will improve Outcome: Progressing   Problem: Self-Concept: Goal: Ability to disclose and discuss suicidal ideas will improve Outcome: Progressing Goal: Will verbalize positive feelings about self Outcome: Progressing

## 2023-05-12 NOTE — Plan of Care (Signed)
Problem: Education: Goal: Emotional status will improve Outcome: Progressing   Problem: Activity: Goal: Interest or engagement in activities will improve Outcome: Progressing   Problem: Safety: Goal: Periods of time without injury will increase Outcome: Progressing

## 2023-05-12 NOTE — [facility] Suicide Risk Assessment (Signed)
[facility] INPATIENT:  Family/Significant Other Suicide Prevention Education  Suicide Prevention Education:  Education Completed; George [name] ( [phone],  (name of family member/significant other) has been identified by the patient as the family member/significant other with whom the patient will be residing, and identified as the person(s) who will aid the patient in the event of a mental health crisis (suicidal ideations/suicide attempt).  With written consent from the patient, the family member/significant other has been provided the following suicide prevention education, prior to the and/or following the discharge of the patient.  CSW spoke with patient boss and boss confirmed that patient could return back to her home . George [name] stated that she could pick patient up or someone else would name George [name], because George [name] called her and told her that patient was being released tomorrow. Boss confirmed that patient does not have access to any guns or weapons.   The suicide prevention education provided includes the following: Suicide risk factors Suicide prevention and interventions National Suicide Hotline telephone number [facility] assessment telephone number [facility] Emergency Assistance 911 [facility] and/or Residential Mobile Crisis Unit telephone number  Request made of family/significant other to: Remove weapons (e.g., guns, rifles, knives), all items previously/currently identified as safety concern.   Remove drugs/medications (over-the-counter, prescriptions, illicit drugs), all items previously/currently identified as a safety concern.  The family member/significant other verbalizes understanding of the suicide prevention education information provided.  The family member/significant other agrees to remove the items of safety concern listed above.  George [name] 05/12/2023, 3:09 PM

## 2023-05-12 NOTE — [facility] Group Notes (Signed)
[facility] Group Notes:  (Nursing/MHT/Case Management/Adjunct)  Date:  05/12/2023  Time:  8:55 PM  Type of Therapy:   wrap up   Participation Level:  Active  Participation Quality:  Appropriate  Affect:  Appropriate  Cognitive:  Alert and Appropriate  Insight:  Appropriate and Good  Engagement in Group:  Engaged  Modes of Intervention:  Discussion  Summary of Progress/Problems: Pt. Shared how today was pretty good. Pt. Shared how she was able to help other fellow pt. And is looking to return home on tomorrow.  Annell Greening Kaanapali 05/12/2023, 8:55 PM

## 2023-05-12 NOTE — Plan of Care (Signed)
Problem: Education: Goal: Emotional status will improve Outcome: Progressing Goal: Mental status will improve Outcome: Progressing   Problem: Activity: Goal: Interest or engagement in activities will improve Outcome: Progressing
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
## 2023-05-12 NOTE — Progress Notes (Signed)
   05/12/23 0559  15 Minute Checks  Location Bedroom  Visual Appearance Calm  Behavior Sleeping  Sleep (Behavioral Health Patients Only)  Calculate sleep? (Click Yes once per 24 hr at 0600 safety check) Yes  Documented sleep last 24 hours 5.75

## 2023-05-12 NOTE — Discharge Instructions (Signed)
-  Follow-up with your outpatient psychiatric provider -instructions on appointment date, time, and address (location) are provided to you in discharge paperwork.  -Take your psychiatric medications as prescribed at discharge - instructions are provided to you in the discharge paperwork  -Follow-up with outpatient primary care doctor and other specialists -for management of preventative medicine and any chronic medical disease.  -Recommend abstinence from alcohol, tobacco, and other illicit drug use at discharge.   -If your psychiatric symptoms recur, worsen, or if you have side effects to your psychiatric medications, call your outpatient psychiatric provider, 911, [phone] or go to the nearest emergency department.  -If suicidal thoughts occur, call your outpatient psychiatric provider, 911, [phone] or go to the nearest emergency department.  

## 2023-05-12 NOTE — Group Note (Signed)
Date:  05/12/2023 Time:  10:05 AM  Group Topic/Focus:  Orientation:   The focus of this group is to educate the patient on the purpose and policies of crisis stabilization and provide a format to answer questions about their admission.  The group details unit policies and expectations of patients while admitted.    Participation Level:  Active  Participation Quality:  Appropriate  Affect:  Appropriate  Cognitive:  Appropriate  Insight: Appropriate  Engagement in Group:  Engaged  Modes of Intervention:  Discussion  Additional Comments:     Reymundo Poll 05/12/2023, 10:05 AM

## 2023-05-12 NOTE — Progress Notes (Addendum)
    05/12/23 2115  Psych Admission Type (Psych Patients Only)  Admission Status Voluntary  Psychosocial Assessment  Patient Complaints None  Eye Contact Fair  Facial Expression Animated  Affect Appropriate to circumstance  Speech Logical/coherent  Interaction Assertive  Motor Activity Fidgety  Appearance/Hygiene Unremarkable  Behavior Characteristics Cooperative;Hyperactive  Mood Pleasant;Anxious  Thought Process  Coherency WDL  Content WDL  Delusions None reported or observed  Perception WDL  Hallucination None reported or observed  Judgment Poor  Confusion None  Danger to Self  Current suicidal ideation? Denies  Danger to Others  Danger to Others None reported or observed   On assessment, patient presents with no complaints. Patient is observed out in the milieu and dayroom interacting with peers. Patient reports she is leaving tomorrow. Patient denies SI/HI/AVH, but reports she still has her "alters, one of them protect me."  Scheduled medications were administered.  Patient is safe on the unit with q15 minute safety checks.

## 2023-05-13 ENCOUNTER — Encounter (HOSPITAL_COMMUNITY): Payer: Self-pay

## 2023-05-13 DIAGNOSIS — F332 Major depressive disorder, recurrent severe without psychotic features: Secondary | ICD-10-CM | POA: Diagnosis not present

## 2023-05-13 MED ORDER — HYDROCORTISONE 1 % EX CREA: TOPICAL_CREAM | Freq: Two times a day (BID) | CUTANEOUS | Status: DC

## 2023-05-13 MED ORDER — FLUOXETINE HCL 20 MG PO CAPS: 20.0000 mg | ORAL_CAPSULE | Freq: Every day | ORAL | 0 refills | Status: DC

## 2023-05-13 NOTE — Progress Notes (Signed)
Pt discharged alert and oriented x 4, calm and cooperative. Discharge instructions reviewed with pt and pt verbalized understanding of instructions. All belongings returned to pt.

## 2023-05-13 NOTE — Progress Notes (Signed)
  [facility] Adult Case Management Discharge Plan :  Will you be returning to the same living situation after discharge:  Yes,  Pt is returning back to her boss house  At discharge, do you have transportation home?: Yes,  Pt ride will pick her up at 1pm today  Do you have the ability to pay for your medications: Yes,  [employer] NON-PARTICIPATING [place]   Release of information consent forms completed and in the chart;  Patient's signature needed at discharge.  Patient to Follow up at:  Follow-up Information     [facility] Call on 05/16/2023.   Why: Please call this provider to schedule appointments for therapy and medication management services since you requested for there information on admission. Contact information: [address] [place] [address] [phone]         [facility] Call on 05/16/2023.   Why: Please reach out to this provider, they do accept your insurance, but the provider requested for you to call them to get an scheduled appointment for medication management and therapy services. Contact information: [address] [place], [place] [address] Phone: [phone] Fax: [phone]                Next level of care provider has access to [facility] Link:no  Safety Planning and Suicide Prevention discussed: Yes,  Dawn ( boss)      Has patient been referred to the Quitline?: Patient does not use tobacco/nicotine products  Patient has been referred for addiction treatment: No known substance use disorder.  Isabella Bowens, LCSWA 05/13/2023, 9:17 AM

## 2023-05-13 NOTE — Group Note (Signed)
Recreation Therapy Group Note   Group Topic:Problem Solving  Group Date: 05/13/2023 Start Time: 1008 End Time: 1025 Facilitators: Zerina Hallinan-McCall, LRT,CTRS Location: [address] Dayroom   Group Topic: Communication, Team Building, Problem Solving  Goal Area(s) Addresses:  Patient will effectively work with peer towards shared goal.  Patient will identify skills used to make activity successful.  Patient will identify how skills used during activity can be used to reach post d/c goals.   Intervention: STEM Activity  Group Description: Stage manager. In teams of 3-5, patients were given 12 plastic drinking straws and an equal length of masking tape. Using the materials provided, patients were asked to build a landing pad to catch a golf ball dropped from approximately 5 feet in the air. All materials were required to be used by the team in their design. LRT facilitated post-activity discussion.  Education: Pharmacist, community, Scientist, physiological, Discharge Planning   Education Outcome: Acknowledges education/In group clarification offered/Needs additional education.    Affect/Mood: Appropriate   Participation Level: Engaged   Participation Quality: Independent   Behavior: Appropriate   Speech/Thought Process: Focused   Insight: Good   Judgement: Good   Modes of Intervention: STEM Activity   Patient Response to Interventions:  Engaged   Education Outcome:  In group clarification offered    Clinical Observations/Individualized Feedback: Pt was engaged, worked well with peers and shared ideas with them as well. Pt also did the majority of the taping of the straws together for the group. Pt appeared to be the leader of the group.    Plan: Continue to engage patient in RT group sessions 2-3x/week.   Lanelle Lindo-McCall, LRT,CTRS 05/13/2023 12:40 PM

## 2023-05-13 NOTE — Progress Notes (Signed)
   05/13/23 0600  15 Minute Checks  Location Bedroom  Visual Appearance Calm  Behavior Sleeping  Sleep (Behavioral Health Patients Only)  Calculate sleep? (Click Yes once per 24 hr at 0600 safety check) Yes  Documented sleep last 24 hours 7.5

## 2023-05-13 NOTE — Progress Notes (Signed)
   05/13/23 9323  Psych Admission Type (Psych Patients Only)  Admission Status Voluntary  Psychosocial Assessment  Patient Complaints None  Eye Contact Fair  Facial Expression Animated  Affect Appropriate to circumstance  Speech Logical/coherent  Interaction Assertive  Motor Activity Fidgety  Appearance/Hygiene Unremarkable  Behavior Characteristics Cooperative;Hyperactive  Mood Pleasant  Thought Process  Coherency WDL  Content WDL  Delusions None reported or observed  Perception WDL  Hallucination None reported or observed  Judgment Poor  Confusion None  Danger to Self  Current suicidal ideation? Denies  Danger to Others  Danger to Others None reported or observed

## 2023-05-13 NOTE — BH IP Treatment Plan (Signed)
Interdisciplinary Treatment and Diagnostic Plan Update  05/13/2023 Time of Session: 9:00 AM ( UPDATE )  George [name] MRN: [id number]  Principal Diagnosis: Major depressive disorder, recurrent episode, severe (HCC)  Secondary Diagnoses: Principal Problem:   Major depressive disorder, recurrent episode, severe (HCC) Active Problems:   Hormone replacement therapy   Anxiety   PTSD (post-traumatic stress disorder)   Gender dysphoria   Current Medications:  Current Facility-Administered Medications  Medication Dose Route Frequency Provider Last Rate Last Admin   acetaminophen (TYLENOL) tablet 650 mg  650 mg Oral Q6H PRN Penn, Cicely, NP       alum & mag hydroxide-simeth (MAALOX/MYLANTA) 200-200-20 MG/5ML suspension 30 mL  30 mL Oral Q4H PRN Penn, Cicely, NP       diphenhydrAMINE (BENADRYL) capsule 50 mg  50 mg Oral TID PRN Mcneil Sober, NP       Or   diphenhydrAMINE (BENADRYL) injection 50 mg  50 mg Intramuscular TID PRN Penn, Cranston Neighbor, NP       emtricitabine-tenofovir (TRUVADA) 200-300 MG per tablet 1 tablet  1 tablet Oral Daily Hill, Shelbie Hutching, MD   1 tablet at 05/13/23 4098   estradiol (ESTRACE) tablet 2 mg  2 mg Oral Daily Otho Bellows, RPH   2 mg at 05/13/23 1191   And   estradiol (ESTRACE) tablet 4 mg  4 mg Oral QHS Otho Bellows, RPH   4 mg at 05/12/23 2113   FLUoxetine (PROZAC) capsule 20 mg  20 mg Oral Daily Hill, Shelbie Hutching, MD   20 mg at 05/13/23 4782   haloperidol (HALDOL) tablet 5 mg  5 mg Oral TID PRN Mcneil Sober, NP       Or   haloperidol lactate (HALDOL) injection 5 mg  5 mg Intramuscular TID PRN Mcneil Sober, NP       hydrocortisone cream 1 %   Topical BID Peterson Ao, MD   Given at 05/12/23 1725   hydrOXYzine (ATARAX) tablet 25 mg  25 mg Oral TID PRN Peterson Ao, MD       LORazepam (ATIVAN) tablet 2 mg  2 mg Oral TID PRN Mcneil Sober, NP       Or   LORazepam (ATIVAN) injection 2 mg  2 mg Intramuscular TID PRN Penn, Cranston Neighbor, NP       magnesium  hydroxide (MILK OF MAGNESIA) suspension 30 mL  30 mL Oral Daily PRN Penn, Cicely, NP       progesterone (PROMETRIUM) capsule 100 mg  100 mg Oral QHS Hill, Shelbie Hutching, MD   100 mg at 05/12/23 2113   spironolactone (ALDACTONE) tablet 200 mg  200 mg Oral QHS Peterson Ao, MD   200 mg at 05/12/23 2113   traZODone (DESYREL) tablet 50 mg  50 mg Oral QHS PRN Peterson Ao, MD       PTA Medications: Medications Prior to Admission  Medication Sig Dispense Refill Last Dose   estradiol (ESTRACE) 1 MG tablet Take 2-4 mg by mouth 2 (two) times daily.      emtricitabine-tenofovir (TRUVADA) 200-300 MG tablet Take 1 tablet by mouth daily.      FLUoxetine (PROZAC) 20 MG capsule Take 1 capsule (20 mg total) by mouth daily. (Patient not taking: Reported on 07/22/2022) 30 capsule 2    progesterone (PROMETRIUM) 100 MG capsule Take 100 mg by mouth at bedtime.      risperiDONE (RISPERDAL) 2 MG tablet Take 0.5 tablets (1 mg total) by mouth at bedtime. (Patient not taking: Reported on 07/22/2022)  30 tablet 2    spironolactone (ALDACTONE) 50 MG tablet Take 100 mg by mouth 2 (two) times daily. Takes 200 qhs       Patient Stressors: Marital or family conflict   Traumatic event    Patient Strengths: Active sense of humor  Average or above average intelligence  Capable of independent living  General fund of knowledge  Supportive family/friends   Treatment Modalities: Medication Management, Group therapy, Case management,  1 to 1 session with clinician, Psychoeducation, Recreational therapy.   Physician Treatment Plan for Primary Diagnosis: Major depressive disorder, recurrent episode, severe (HCC) Long Term Goal(s): Improvement in symptoms so as ready for discharge   Short Term Goals: Ability to identify changes in lifestyle to reduce recurrence of condition will improve Ability to verbalize feelings will improve Ability to disclose and discuss suicidal ideas Ability to demonstrate self-control will  improve Ability to identify and develop effective coping behaviors will improve Ability to maintain clinical measurements within normal limits will improve Compliance with prescribed medications will improve Ability to identify triggers associated with substance abuse/mental health issues will improve  Medication Management: Evaluate patient's response, side effects, and tolerance of medication regimen.  Therapeutic Interventions: 1 to 1 sessions, Unit Group sessions and Medication administration.  Evaluation of Outcomes: Adequate for Discharge  Physician Treatment Plan for Secondary Diagnosis: Principal Problem:   Major depressive disorder, recurrent episode, severe (HCC) Active Problems:   Hormone replacement therapy   Anxiety   PTSD (post-traumatic stress disorder)   Gender dysphoria  Long Term Goal(s): Improvement in symptoms so as ready for discharge   Short Term Goals: Ability to identify changes in lifestyle to reduce recurrence of condition will improve Ability to verbalize feelings will improve Ability to disclose and discuss suicidal ideas Ability to demonstrate self-control will improve Ability to identify and develop effective coping behaviors will improve Ability to maintain clinical measurements within normal limits will improve Compliance with prescribed medications will improve Ability to identify triggers associated with substance abuse/mental health issues will improve     Medication Management: Evaluate patient's response, side effects, and tolerance of medication regimen.  Therapeutic Interventions: 1 to 1 sessions, Unit Group sessions and Medication administration.  Evaluation of Outcomes: Adequate for Discharge   RN Treatment Plan for Primary Diagnosis: Major depressive disorder, recurrent episode, severe (HCC) Long Term Goal(s): Knowledge of disease and therapeutic regimen to maintain health will improve  Short Term Goals: Ability to remain free from  injury will improve, Ability to verbalize frustration and anger appropriately will improve, Ability to participate in decision making will improve, Ability to verbalize feelings will improve, Ability to identify and develop effective coping behaviors will improve, and Compliance with prescribed medications will improve  Medication Management: RN will administer medications as ordered by provider, will assess and evaluate patient's response and provide education to patient for prescribed medication. RN will report any adverse and/or side effects to prescribing provider.  Therapeutic Interventions: 1 on 1 counseling sessions, Psychoeducation, Medication administration, Evaluate responses to treatment, Monitor vital signs and CBGs as ordered, Perform/monitor CIWA, COWS, AIMS and Fall Risk screenings as ordered, Perform wound care treatments as ordered.  Evaluation of Outcomes: Adequate for Discharge   LCSW Treatment Plan for Primary Diagnosis: Major depressive disorder, recurrent episode, severe (HCC) Long Term Goal(s): Safe transition to appropriate next level of care at discharge, Engage patient in therapeutic group addressing interpersonal concerns.  Short Term Goals: Engage patient in aftercare planning with referrals and resources, Increase social support, Increase emotional  regulation, Facilitate acceptance of mental health diagnosis and concerns, Identify triggers associated with mental health/substance abuse issues, and Increase skills for wellness and recovery  Therapeutic Interventions: Assess for all discharge needs, 1 to 1 time with Social worker, Explore available resources and support systems, Assess for adequacy in community support network, Educate family and significant other(s) on suicide prevention, Complete Psychosocial Assessment, Interpersonal group therapy.  Evaluation of Outcomes: Adequate for Discharge   Progress in Treatment: Attending groups: Yes. Participating in groups:  Yes. Taking medication as prescribed: Yes. Toleration medication: Yes. Family/Significant other contact made: Yes, [name] ( boss) [phone] Patient understands diagnosis: Yes. Discussing patient identified problems/goals with staff: Yes. Medical problems stabilized or resolved: No. Denies suicidal/homicidal ideation: Yes. Issues/concerns per patient self-inventory: No.   New problem(s) identified: No, Describe:  none reported   New Short Term/Long Term Goal(s):medication stabilization, elimination of SI thoughts, development of comprehensive mental wellness plan.      Patient Goals:  "work on my depression"   Discharge Plan or Barriers: Patient recently admitted. CSW will continue to follow and assess for appropriate referrals and possible discharge planning.      Reason for Continuation of Hospitalization: Depression Medication stabilization Suicidal ideation   Estimated Length of Stay: Adequate for DC today   Last 3 [place] Suicide Severity Risk Score: Flowsheet Row Admission (Current) [facility] from 05[facility]07/2023 [facility] ED from 03/01/2023 in [facility] at [facility]  C-SSRS RISK CATEGORY High Risk High Risk No Risk       Last PHQ 2/9 Scores:     No data to display          Scribe for Treatment Team: Isabella Bowens, LCSWA 05/13/2023 9:00 AM

## 2023-05-16 NOTE — B[facility] Group Notes (Signed)
Spiritual care group on grief and loss facilitated by Chaplain Dyanne Carrel, Bcc and Arlyce Dice, Mdiv  Group Goal: Support / Education around grief and loss  Members engage in facilitated group support and psycho-social education.  Group Description:  Following introductions and group rules, group members engaged in facilitated group dialogue and support around topic of loss, with particular support around experiences of loss in their lives. Group Identified types of loss (relationships / self / things) and identified patterns, circumstances, and changes that precipitate losses. Reflected on thoughts / feelings around loss, normalized grief responses, and recognized variety in grief experience. Group encouraged individual reflection on safe space and on the coping skills that they are already utilizing.  Group drew on Adlerian / Rogerian and narrative framework  Patient Progress: [name] attended group and actively engaged and participated in group conversation and activities.  Comments demonstrated good insight into the topic and contributed positively to the conversation.

## 2023-08-01 ENCOUNTER — Emergency Department
Admission: EM | Admit: 2023-08-01 | Discharge: 2023-08-02 | Disposition: A | Discharge status: Home / Self Care | Payer: BLUE CROSS/BLUE SHIELD | Attending: Emergency Medicine | Admitting: Emergency Medicine

## 2023-08-01 ENCOUNTER — Encounter: Payer: Self-pay | Admitting: Emergency Medicine

## 2023-08-01 ENCOUNTER — Other Ambulatory Visit: Payer: Self-pay

## 2023-08-01 DIAGNOSIS — E860 Dehydration: Secondary | ICD-10-CM | POA: Insufficient documentation

## 2023-08-01 DIAGNOSIS — R112 Nausea with vomiting, unspecified: Secondary | ICD-10-CM | POA: Insufficient documentation

## 2023-08-01 DIAGNOSIS — D72829 Elevated white blood cell count, unspecified: Secondary | ICD-10-CM | POA: Insufficient documentation

## 2023-08-01 DIAGNOSIS — R42 Dizziness and giddiness: Secondary | ICD-10-CM

## 2023-08-01 LAB — COMPREHENSIVE METABOLIC PANEL
ALT: 32 U/L (ref 0–44)
AST: 38 U/L (ref 15–41)
Albumin: 4.2 g/dL (ref 3.5–5.0)
Alkaline Phosphatase: 55 U/L (ref 38–126)
Anion gap: 14 (ref 5–15)
BUN: 15 mg/dL (ref 6–20)
CO2: 21 mmol/L — ABNORMAL LOW (ref 22–32)
Calcium: 8.7 mg/dL — ABNORMAL LOW (ref 8.9–10.3)
Chloride: 97 mmol/L — ABNORMAL LOW (ref 98–111)
Creatinine, Ser: 0.76 mg/dL (ref 0.61–1.24)
GFR, Estimated: >60 mL/min (ref >60)
Glucose, Bld: 154 mg/dL — ABNORMAL HIGH (ref 70–99)
Potassium: 4 mmol/L (ref 3.5–5.1)
Sodium: 132 mmol/L — ABNORMAL LOW (ref 135–145)
Total Bilirubin: 0.9 mg/dL (ref <1.2)
Total Protein: 6.6 g/dL (ref 6.5–8.1)

## 2023-08-01 LAB — CBC WITH DIFFERENTIAL/PLATELET
Abs Immature Granulocytes: 0.24 10*3/uL — ABNORMAL HIGH (ref 0.00–0.07)
Basophils Absolute: 0.1 10*3/uL (ref 0.0–0.1)
Basophils Relative: 1 %
Eosinophils Absolute: 0 10*3/uL (ref 0.0–0.5)
Eosinophils Relative: 0 %
HCT: 52.2 % — ABNORMAL HIGH (ref 39.0–52.0)
Hemoglobin: 18.8 g/dL — ABNORMAL HIGH (ref 13.0–17.0)
Immature Granulocytes: 1 %
Lymphocytes Relative: 9 %
Lymphs Abs: 2.3 10*3/uL (ref 0.7–4.0)
MCH: 31.4 pg (ref 26.0–34.0)
MCHC: 36 g/dL (ref 30.0–36.0)
MCV: 87.1 fL (ref 80.0–100.0)
Monocytes Absolute: 1.6 10*3/uL — ABNORMAL HIGH (ref 0.1–1.0)
Monocytes Relative: 6 %
Neutro Abs: 23 10*3/uL — ABNORMAL HIGH (ref 1.7–7.7)
Neutrophils Relative %: 83 %
Platelets: UNDETERMINED 10*3/uL (ref 150–400)
RBC: 5.99 MIL/uL — ABNORMAL HIGH (ref 4.22–5.81)
RDW: 12.2 % (ref 11.5–15.5)
Smear Review: UNDETERMINED
WBC: 27.4 10*3/uL — ABNORMAL HIGH (ref 4.0–10.5)
nRBC: 0 % (ref 0.0–0.2)

## 2023-08-01 LAB — TROPONIN I (HIGH SENSITIVITY): Troponin I (High Sensitivity): 3 ng/L (ref <18)

## 2023-08-01 MED ORDER — SODIUM CHLORIDE 0.9 % IV BOLUS
1000.0000 mL | Freq: Once | INTRAVENOUS | Status: AC
  Administered 2023-08-01: 1000 mL via INTRAVENOUS

## 2023-08-01 NOTE — ED Provider Notes (Signed)
[facility] Provider Note    Event Date/Time   First MD Initiated Contact with Patient 08/01/23 2136     (approximate)  History   Chief Complaint: Dizziness  HPI  George [name] is a 27 y.o. adult who dents to the emergency department for weakness nausea and vomiting.  According to the patient around 2 PM today she donated plasma.  So shortly afterwards was feeling very weak and nauseated and vomited so they came to the emergency department for evaluation.  Patient states normally they give saline after the plasma donation however because of the saline shortage they gave the patient Gatorade instead.  Said she tried to drink some but vomited shortly afterwards.  No abdominal pain.  No chest pain.  No fever.  Physical Exam   Triage Vital Signs: ED Triage Vitals  Encounter Vitals Group     BP 08/01/23 1915 121/84     Systolic BP Percentile --      Diastolic BP Percentile --      Pulse Rate 08/01/23 1915 (!) 130     Resp 08/01/23 1915 20     Temp 08/01/23 1912 97.6 F (36.4 C)     Temp Source 08/01/23 1912 Oral     SpO2 08/01/23 1915 100 %     Weight 08/01/23 1912 117 lb (53.1 kg)     Height --      Head Circumference --      Peak Flow --      Pain Score 08/01/23 1911 4     Pain Loc --      Pain Education --      Exclude from Growth Chart --     Most recent vital signs: Vitals:   08/01/23 1912 08/01/23 1915  BP:  121/84  Pulse:  (!) 130  Resp:  20  Temp: 97.6 F (36.4 C)   SpO2:  100%    General: Awake, no distress.  CV:  Good peripheral perfusion.  Regular rate and rhythm  Resp:  Normal effort.  Equal breath sounds bilaterally.  Abd:  No distention.  Soft, nontender.  No rebound or guarding.  ED Results / Procedures / Treatments   EKG  EKG viewed and interpreted by myself shows sinus tachycardia 125 bpm with a narrow QRS, right axis deviation, largely normal intervals with nonspecific ST changes.  RADIOLOGY  ***   MEDICATIONS  ORDERED IN ED: Medications  sodium chloride 0.9 % bolus 1,000 mL (has no administration in time range)  sodium chloride 0.9 % bolus 1,000 mL (has no administration in time range)     IMPRESSION / MDM / ASSESSMENT AND PLAN / ED COURSE  I reviewed the triage vital signs and the nursing notes.  Patient's presentation is most consistent with acute presentation with potential threat to life or bodily function.  Patient presents to the emergency department for nausea weakness fatigue after donating plasma earlier today.  Lab work shows a reassuring chemistry with normal renal function.  Troponin negative.  CBC however shows significant leukocytosis of 27,000, record review shows prior white blood cell count elevations throughout visits however not to this degree.  Hemoglobin is also very concentrated at 18.8.  Suspect significant dehydration due to the lack of saline administration after donating plasma.  Will dose 2 L of IV fluids and recheck a CBC.  Otherwise reassuring physical exam with no concerning findings.  Remains tachycardic around 110 bpm.  FINAL CLINICAL IMPRESSION(S) / ED DIAGNOSES   Dehydration  Note:  This document was prepared using Dragon voice recognition software and may include unintentional dictation errors.

## 2023-08-01 NOTE — ED Triage Notes (Signed)
Patient c/o lightheadedness, dizziness, and nausea since donating plasma today.

## 2023-08-02 LAB — CBC
HCT: 38.9 % — ABNORMAL LOW (ref 39.0–52.0)
Hemoglobin: 13.8 g/dL (ref 13.0–17.0)
MCH: 31.7 pg (ref 26.0–34.0)
MCHC: 35.5 g/dL (ref 30.0–36.0)
MCV: 89.2 fL (ref 80.0–100.0)
Platelets: 335 10*3/uL (ref 150–400)
RBC: 4.36 MIL/uL (ref 4.22–5.81)
RDW: 12.3 % (ref 11.5–15.5)
WBC: 18.9 10*3/uL — ABNORMAL HIGH (ref 4.0–10.5)
nRBC: 0 % (ref 0.0–0.2)

## 2023-08-02 NOTE — Discharge Instructions (Signed)
Your initial blood work was suggestive of dehydration.  After 2 L of IV fluid this has improved.  Please increase your fluid intake for the next several days.  I recommend avoiding plasma donation for the next few weeks.

## 2023-08-02 NOTE — ED Provider Notes (Signed)
11:30 PM  Assumed care at shift change.  Patient with dizziness after donating plasma.  Labs show hemoconcentration.  Plan was to hydrate and recheck CBC.  3:37 AM  Pt's repeat CBC improved.  Still has a leukocytosis which appears chronic but now hemoglobin is 13 and platelet count of 335,000.  Patient hemodynamically stable.  Will discharge.   Talik Casique, Layla Maw, DO 08/02/23 [phone]

## 2023-12-05 ENCOUNTER — Emergency Department
Admission: EM | Admit: 2023-12-05 | Discharge: 2023-12-06 | Disposition: A | Discharge status: Other Acute Inpatient Hospital | Source: Home / Self Care | Attending: Emergency Medicine | Admitting: Emergency Medicine

## 2023-12-05 ENCOUNTER — Other Ambulatory Visit: Payer: Self-pay

## 2023-12-05 DIAGNOSIS — Y900 Blood alcohol level of less than 20 mg/100 ml: Secondary | ICD-10-CM | POA: Insufficient documentation

## 2023-12-05 DIAGNOSIS — F333 Major depressive disorder, recurrent, severe with psychotic symptoms: Secondary | ICD-10-CM | POA: Insufficient documentation

## 2023-12-05 DIAGNOSIS — R21 Rash and other nonspecific skin eruption: Secondary | ICD-10-CM | POA: Insufficient documentation

## 2023-12-05 DIAGNOSIS — F4481 Dissociative identity disorder: Secondary | ICD-10-CM | POA: Insufficient documentation

## 2023-12-05 DIAGNOSIS — F332 Major depressive disorder, recurrent severe without psychotic features: Secondary | ICD-10-CM | POA: Diagnosis not present

## 2023-12-05 DIAGNOSIS — R45851 Suicidal ideations: Secondary | ICD-10-CM | POA: Insufficient documentation

## 2023-12-05 LAB — CBC
HCT: 49.2 % (ref 39.0–52.0)
Hemoglobin: 17.5 g/dL — ABNORMAL HIGH (ref 13.0–17.0)
MCH: 31 pg (ref 26.0–34.0)
MCHC: 35.6 g/dL (ref 30.0–36.0)
MCV: 87.2 fL (ref 80.0–100.0)
Platelets: 376 10*3/uL (ref 150–400)
RBC: 5.64 MIL/uL (ref 4.22–5.81)
RDW: 12.1 % (ref 11.5–15.5)
WBC: 14.7 10*3/uL — ABNORMAL HIGH (ref 4.0–10.5)
nRBC: 0 % (ref 0.0–0.2)

## 2023-12-05 LAB — COMPREHENSIVE METABOLIC PANEL WITH GFR
ALT: 18 U/L (ref 0–44)
AST: 19 U/L (ref 15–41)
Albumin: 4 g/dL (ref 3.5–5.0)
Alkaline Phosphatase: 45 U/L (ref 38–126)
Anion gap: 10 (ref 5–15)
BUN: 15 mg/dL (ref 6–20)
CO2: 22 mmol/L (ref 22–32)
Calcium: 8.8 mg/dL — ABNORMAL LOW (ref 8.9–10.3)
Chloride: 104 mmol/L (ref 98–111)
Creatinine, Ser: 0.78 mg/dL (ref 0.61–1.24)
GFR, Estimated: >60 mL/min (ref >60)
Glucose, Bld: 93 mg/dL (ref 70–99)
Potassium: 4 mmol/L (ref 3.5–5.1)
Sodium: 136 mmol/L (ref 135–145)
Total Bilirubin: 0.7 mg/dL (ref 0.0–1.2)
Total Protein: 6.3 g/dL — ABNORMAL LOW (ref 6.5–8.1)

## 2023-12-05 LAB — URINE DRUG SCREEN, QUALITATIVE (ARMC ONLY)
Amphetamines, Ur Screen: NOT DETECTED
Barbiturates, Ur Screen: NOT DETECTED
Benzodiazepine, Ur Scrn: NOT DETECTED
Cannabinoid 50 Ng, Ur ~~LOC~~: NOT DETECTED
Cocaine Metabolite,Ur ~~LOC~~: NOT DETECTED
MDMA (Ecstasy)Ur Screen: NOT DETECTED
Methadone Scn, Ur: NOT DETECTED
Opiate, Ur Screen: NOT DETECTED
Phencyclidine (PCP) Ur S: NOT DETECTED
Tricyclic, Ur Screen: NOT DETECTED

## 2023-12-05 LAB — ETHANOL: Alcohol, Ethyl (B): <10 mg/dL (ref <10)

## 2023-12-05 LAB — SALICYLATE LEVEL: Salicylate Lvl: <7 mg/dL — ABNORMAL LOW (ref 7.0–30.0)

## 2023-12-05 LAB — ACETAMINOPHEN LEVEL: Acetaminophen (Tylenol), Serum: <10 ug/mL — ABNORMAL LOW (ref 10–30)

## 2023-12-05 MED ORDER — PREDNISONE 50 MG PO TABS
60.0000 mg | ORAL_TABLET | Freq: Once | ORAL | Status: AC
  Administered 2023-12-05: 60 mg via ORAL
  Filled 2023-12-05: qty 1

## 2023-12-05 MED ORDER — CETIRIZINE HCL 5 MG/5ML PO SOLN
10.0000 mg | Freq: Once | ORAL | Status: AC
  Administered 2023-12-05: 10 mg via ORAL
  Filled 2023-12-05: qty 10

## 2023-12-05 MED ORDER — DIPHENHYDRAMINE HCL 25 MG PO CAPS
25.0000 mg | ORAL_CAPSULE | Freq: Once | ORAL | Status: AC
  Administered 2023-12-05: 25 mg via ORAL
  Filled 2023-12-05: qty 1

## 2023-12-05 NOTE — ED Notes (Signed)
 Patient complaining of itching and rash. EDP Dr. Drenda Gentle made aware.

## 2023-12-05 NOTE — ED Provider Notes (Addendum)
 George [name] Provider Note    Event Date/Time   First MD Initiated Contact with Patient 12/05/23 1659     (approximate)   History   Rash and Suicidal   HPI  George [name] is a 28 y.o. adult with history of ADHD, anxiety, depression, transgender male, presenting with SI as well as a rash.  States that she noticed pruritic rash to her left knee that extends up her knee to her arms and the back.  Denies any new drugs, exposures.  Has not taken any medications.  Denies any eye redness or intraoral lesions.  Also with SI, states that she has plans to walk into traffic but knows that she needs help and so is here and wants to see psychiatry.   On admission chart review, patient was admitted for MDD in September 2024, was restarted on her Prozac and discontinued off her respite all at that time.  Physical Exam   Triage Vital Signs: ED Triage Vitals  Encounter Vitals Group     BP 12/05/23 1635 111/78     Systolic BP Percentile --      Diastolic BP Percentile --      Pulse Rate 12/05/23 1635 (!) 131     Resp 12/05/23 1635 19     Temp 12/05/23 1635 98.3 F (36.8 C)     Temp Source 12/05/23 1635 Oral     SpO2 12/05/23 1635 95 %     Weight 12/05/23 1637 117 lb 1 oz (53.1 kg)     Height 12/05/23 1637 5\' 8"  (1.727 m)     Head Circumference --      Peak Flow --      Pain Score 12/05/23 1637 0     Pain Loc --      Pain Education --      Exclude from Growth Chart --     Most recent vital signs: Vitals:   12/05/23 1635  BP: 111/78  Pulse: (!) 131  Resp: 19  Temp: 98.3 F (36.8 C)  SpO2: 95%     General: Awake, no distress.  CV:  Good peripheral perfusion.  Resp:  Normal effort.  Abd:  No distention.  Other:  No external signs of trauma, calm, reading in bed.  Has a pruritic rash to her lower back, arms and legs, rash does coalesce around her right inner thigh.  No rash to her soles or palms.  Rash is blanching.  No rash in her labs, no intraoral  lesions, no conjunctivitis, oropharynx is clear.   ED Results / Procedures / Treatments   Labs (all labs ordered are listed, but only abnormal results are displayed) Labs Reviewed  COMPREHENSIVE METABOLIC PANEL WITH GFR - Abnormal; Notable for the following components:      Result Value   Calcium 8.8 (*)    Total Protein 6.3 (*)    All other components within normal limits  SALICYLATE LEVEL - Abnormal; Notable for the following components:   Salicylate Lvl <7.0 (*)    All other components within normal limits  ACETAMINOPHEN LEVEL - Abnormal; Notable for the following components:   Acetaminophen (Tylenol), Serum <10 (*)    All other components within normal limits  CBC - Abnormal; Notable for the following components:   WBC 14.7 (*)    Hemoglobin 17.5 (*)    All other components within normal limits  ETHANOL  URINE DRUG SCREEN, QUALITATIVE ([facility] ONLY)     PROCEDURES:  Critical Care  performed: No  Procedures   MEDICATIONS ORDERED IN ED: Medications  diphenhydrAMINE (BENADRYL) capsule 25 mg (25 mg Oral Given 12/05/23 1818)     IMPRESSION / MDM / ASSESSMENT AND PLAN / ED COURSE  I reviewed the triage vital signs and the nursing notes.                              Differential diagnosis includes, but is not limited to, for the rash, considered hives, allergic reaction, doubt anaphylaxis, doubt SJS, will give her some Benadryl, for the SI, given that she is calm, willing to stay with psych, discussed with patient and we will place her on voluntary for now.  She knows that she tries to leave, we will place an IVC hold on her.  Patient's presentation is most consistent with acute presentation with potential threat to life or bodily function.  Independent review of labs below.  Patient is medically cleared for psychiatric evaluation.  Was notified by nurse that patient is asking for something additional for the rash, will give her some prednisone here as well as cetirizine.   Will continue to monitor for psych dispo.  Clinical Course as of 12/05/23 1826  Mon Dec 05, 2023  4098 Independent review of labs, urine drug screen is negative, she is mild leukocytosis that is nonspecific, electrolytes not severely deranged, Tylenol, salicylate, ethanol levels are not elevated. [TT]    Clinical Course User Index [TT] Drenda Gentle, Richard Champion, MD     FINAL CLINICAL IMPRESSION(S) / ED DIAGNOSES   Final diagnoses:  Suicidal ideation  Rash     Rx / DC Orders   ED Discharge Orders     None        Note:  This document was prepared using Dragon voice recognition software and may include unintentional dictation errors.    Shane Darling, MD 12/05/23 [name]    Shane Darling, MD 12/05/23 2124

## 2023-12-05 NOTE — ED Notes (Signed)
Snack given at this time

## 2023-12-05 NOTE — ED Notes (Signed)
 Pt. To BHU from ED ambulatory without difficulty, to room BHU 1. Report from [facility]. Pt. Is alert and oriented, warm and dry in no distress. Pt. Denies currently having SI, HI, and AVH. Pt. Calm and cooperative. Pt. Made aware of security cameras and Q15 minute rounds. Pt. Encouraged to let Nursing staff know of any concerns or needs.   ENVIRONMENTAL ASSESSMENT Potentially harmful objects out of patient reach: Yes.   Personal belongings secured: Yes.   Patient dressed in hospital provided attire only: Yes.   Plastic bags out of patient reach: Yes.   Patient care equipment (cords, cables, call bells, lines, and drains) shortened, removed, or accounted for: Yes.   Equipment and supplies removed from bottom of stretcher: Yes.   Potentially toxic materials out of patient reach: Yes.   Sharps container removed or out of patient reach: Yes.

## 2023-12-05 NOTE — ED Notes (Signed)
 VOL/  PENDING  CONSULT

## 2023-12-05 NOTE — ED Notes (Addendum)
 Meds given for rash on legs.  Pt reports itching and has not taken any meds for itching.  Pt also reports SI today.  Pt denies drugs or etoh use.  Pt denies HI.  Pt is in hallway bed.  Pt is calm and cooperative.

## 2023-12-05 NOTE — ED Notes (Signed)
 TTS and telepsy provider in to see patient at this time.

## 2023-12-05 NOTE — ED Triage Notes (Signed)
 Pt here with a rash and SI. Pt states she has been itching since last night in her right inner thigh and all over that is severe. Pt also states she has been having SI. Pt has not tried any Benadryl. Pt states she is having current issues that is making them have SI.
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
## 2023-12-05 NOTE — ED Notes (Addendum)
 Pt belongings:  1 blue tank top 1 black bra 1 pair of black shorts 1 pair of black boots 1 pair of white socks 1 pink hair tie 1 pair of black underwear 1 blue Stitch bag with personal belongings 1 black cellphone  Cup of several silver necklaces and piercings

## 2023-12-05 NOTE — ED Provider Triage Note (Signed)
 Emergency Medicine Provider Triage Evaluation Note  George [name] , a 28 y.o. adult  was evaluated in triage.  Pt complains of itching to the right inner thigh that began last night. Also endorses SI. Denies no new soaps or detergents. No medications PTA.   States she was going to come for SI and thought the itching could be evaluated as well. Reports has been hospitalized multiple times at multiple hospitals throughout the country.  Review of Systems  Positive: Itching, SI Negative:   Physical Exam  There were no vitals taken for this visit. Gen:   Awake, no distress   Resp:  Normal effort  MSK:   Moves extremities without difficulty  Other:    Medical Decision Making  Medically screening exam initiated at 4:34 PM.  Appropriate orders placed.  Naithan Delage was informed that the remainder of the evaluation will be completed by another provider, this initial triage assessment does not replace that evaluation, and the importance of remaining in the ED until their evaluation is complete.     Phyliss Breen, PA-C 12/05/23 1639

## 2023-12-06 ENCOUNTER — Inpatient Hospital Stay
Admission: AD | Admit: 2023-12-06 | Discharge: 2023-12-12 | DRG: 885 | Disposition: A | Discharge status: Home / Self Care | Source: Intra-hospital | Attending: Psychiatry | Admitting: Psychiatry

## 2023-12-06 ENCOUNTER — Encounter: Payer: Self-pay | Admitting: Psychiatry

## 2023-12-06 DIAGNOSIS — F410 Panic disorder [episodic paroxysmal anxiety] without agoraphobia: Secondary | ICD-10-CM | POA: Diagnosis present

## 2023-12-06 DIAGNOSIS — F840 Autistic disorder: Secondary | ICD-10-CM | POA: Diagnosis present

## 2023-12-06 DIAGNOSIS — F603 Borderline personality disorder: Secondary | ICD-10-CM | POA: Diagnosis present

## 2023-12-06 DIAGNOSIS — R4587 Impulsiveness: Secondary | ICD-10-CM | POA: Diagnosis present

## 2023-12-06 DIAGNOSIS — Z5900 Homelessness unspecified: Secondary | ICD-10-CM | POA: Diagnosis not present

## 2023-12-06 DIAGNOSIS — Z5982 Transportation insecurity: Secondary | ICD-10-CM

## 2023-12-06 DIAGNOSIS — Z79899 Other long term (current) drug therapy: Secondary | ICD-10-CM | POA: Diagnosis not present

## 2023-12-06 DIAGNOSIS — F4310 Post-traumatic stress disorder, unspecified: Secondary | ICD-10-CM | POA: Diagnosis present

## 2023-12-06 DIAGNOSIS — F640 Transsexualism: Secondary | ICD-10-CM | POA: Diagnosis present

## 2023-12-06 DIAGNOSIS — Z888 Allergy status to other drugs, medicaments and biological substances status: Secondary | ICD-10-CM | POA: Diagnosis not present

## 2023-12-06 DIAGNOSIS — Z5941 Food insecurity: Secondary | ICD-10-CM

## 2023-12-06 DIAGNOSIS — Z9152 Personal history of nonsuicidal self-harm: Secondary | ICD-10-CM | POA: Diagnosis not present

## 2023-12-06 DIAGNOSIS — Z5948 Other specified lack of adequate food: Secondary | ICD-10-CM | POA: Diagnosis not present

## 2023-12-06 DIAGNOSIS — Z91148 Patient's other noncompliance with medication regimen for other reason: Secondary | ICD-10-CM

## 2023-12-06 DIAGNOSIS — F332 Major depressive disorder, recurrent severe without psychotic features: Secondary | ICD-10-CM | POA: Diagnosis present

## 2023-12-06 DIAGNOSIS — R45851 Suicidal ideations: Secondary | ICD-10-CM | POA: Diagnosis present

## 2023-12-06 DIAGNOSIS — F909 Attention-deficit hyperactivity disorder, unspecified type: Secondary | ICD-10-CM | POA: Diagnosis present

## 2023-12-06 DIAGNOSIS — F333 Major depressive disorder, recurrent, severe with psychotic symptoms: Secondary | ICD-10-CM | POA: Diagnosis present

## 2023-12-06 DIAGNOSIS — F4481 Dissociative identity disorder: Secondary | ICD-10-CM | POA: Diagnosis present

## 2023-12-06 DIAGNOSIS — Z560 Unemployment, unspecified: Secondary | ICD-10-CM

## 2023-12-06 DIAGNOSIS — Z818 Family history of other mental and behavioral disorders: Secondary | ICD-10-CM

## 2023-12-06 DIAGNOSIS — R21 Rash and other nonspecific skin eruption: Secondary | ICD-10-CM | POA: Diagnosis present

## 2023-12-06 HISTORY — DX: Dissociative identity disorder: F44.81

## 2023-12-06 HISTORY — DX: Autistic disorder: F84.0

## 2023-12-06 MED ORDER — EMTRICITABINE-TENOFOVIR AF 200-25 MG PO TABS
1.0000 | ORAL_TABLET | Freq: Every day | ORAL | Status: DC
  Filled 2023-12-06: qty 1

## 2023-12-06 MED ORDER — ESTRADIOL 0.5 MG PO TABS
3.0000 mg | ORAL_TABLET | Freq: Two times a day (BID) | ORAL | Status: DC
  Filled 2023-12-06: qty 6

## 2023-12-06 MED ORDER — SPIRONOLACTONE 100 MG PO TABS
100.0000 mg | ORAL_TABLET | Freq: Two times a day (BID) | ORAL | Status: DC
  Filled 2023-12-06: qty 1

## 2023-12-06 MED ORDER — ALUM & MAG HYDROXIDE-SIMETH 200-200-20 MG/5ML PO SUSP: 30.0000 mL | ORAL | Status: DC | PRN

## 2023-12-06 MED ORDER — FLUOXETINE HCL 20 MG PO CAPS: 20.0000 mg | ORAL_CAPSULE | Freq: Every day | ORAL | Status: DC

## 2023-12-06 MED ORDER — MAGNESIUM HYDROXIDE 400 MG/5ML PO SUSP: 30.0000 mL | Freq: Every day | ORAL | Status: DC | PRN

## 2023-12-06 MED ORDER — PROGESTERONE MICRONIZED 100 MG PO CAPS: 100.0000 mg | ORAL_CAPSULE | Freq: Every day | ORAL | Status: DC

## 2023-12-06 MED ORDER — OLANZAPINE 10 MG PO TABS: 10.0000 mg | ORAL_TABLET | Freq: Every day | ORAL | Status: DC

## 2023-12-06 MED ORDER — DIPHENHYDRAMINE HCL 25 MG PO CAPS
50.0000 mg | ORAL_CAPSULE | Freq: Four times a day (QID) | ORAL | Status: DC | PRN
  Administered 2023-12-06: 50 mg via ORAL

## 2023-12-06 MED ORDER — DIPHENHYDRAMINE HCL 25 MG PO CAPS
50.0000 mg | ORAL_CAPSULE | Freq: Once | ORAL | Status: DC
  Filled 2023-12-06: qty 2

## 2023-12-06 MED ORDER — TRAZODONE HCL 50 MG PO TABS
50.0000 mg | ORAL_TABLET | Freq: Every evening | ORAL | Status: DC | PRN
  Filled 2023-12-06 (×2): qty 1

## 2023-12-06 MED ORDER — ACETAMINOPHEN 325 MG PO TABS: 650.0000 mg | ORAL_TABLET | Freq: Four times a day (QID) | ORAL | Status: DC | PRN

## 2023-12-06 NOTE — ED Provider Notes (Signed)
 EKG done at behavioral medicine's request for placement:  ED ECG REPORT I, Skyanne Welle J, the attending physician, personally viewed and interpreted this ECG.   Date: 12/06/2023  EKG Time: 0639  Rate: 59  Rhythm: normal sinus rhythm  Axis: Normal  Intervals:none  ST&T Change: Nonspecific    Norlene Beavers, MD 12/06/23 [phone]

## 2023-12-06 NOTE — Tx Team (Signed)
 Initial Treatment Plan 12/06/2023 1:16 PM [name] [id number]    PATIENT STRESSORS: Financial difficulties   Loss of living arrangement   Occupational concerns   Other: "Fuck Jalene Mayor" Patient reports the "political climate" is her main stressor     PATIENT STRENGTHS: Average or above average intelligence  Supportive family/friends  Other: Patient reports she cannot share her top 3 strengths because "they are all number 1." When asked to list 3 of them, she states "I cannot because they change on a daily basis."    PATIENT IDENTIFIED PROBLEMS: Suicidal Ideations   Homelessness  Jobless                 DISCHARGE CRITERIA:  Ability to meet basic life and health needs Adequate post-discharge living arrangements Improved stabilization in mood, thinking, and/or behavior Motivation to continue treatment in a less acute level of care Verbal commitment to aftercare and medication compliance  PRELIMINARY DISCHARGE PLAN: Outpatient therapy Placement in alternative living arrangements  PATIENT/FAMILY INVOLVEMENT: This treatment plan has been presented to and reviewed with the patient, George [name].  The patient and has been given the opportunity to ask questions and make suggestions.  Alwin Joy, RN 12/06/2023, 1:16 PM

## 2023-12-06 NOTE — ED Provider Notes (Signed)
 Emergency Medicine Observation Re-evaluation Note  George [name] is a 28 y.o. adult, seen on rounds today.  Pt initially presented to the ED for complaints of Rash and Suicidal  Currently, the patient is calm, no acute complaints.  Physical Exam  Blood pressure 103/69, pulse 76, temperature 98.3 F (36.8 C), temperature source Oral, resp. rate 18, height 5\' 8"  (1.727 m), weight 53.1 kg, SpO2 99%. Physical Exam General: NAD Lungs: CTAB Psych: not agitated  ED Course / MDM  EKG:    I have reviewed the labs performed to date as well as medications administered while in observation.  Recent changes in the last 24 hours include no acute events overnight.  Home meds ordered this morning, added at bedtime zyprexa per psych recs.   Plan  Current plan is for psych admission, IVC prn. Patient is not under full IVC at this time.   Jacquie Maudlin, MD 12/06/23 1014

## 2023-12-06 NOTE — Progress Notes (Signed)
   12/06/23 1115  Spiritual Encounters  Type of Visit Initial  Care provided to: Patient  Conversation partners present during encounter Nurse  Reason for visit Routine spiritual support  OnCall Visit No   Chaplain saw patient in the BHU (Locked Unit) and provided a compassionate presence.  Affirmed patient's identity and used the preferred pronouns and she shared a bit of the story of who she is.    Rev. Rana M. Nolon Baxter, M.Div. Chaplain Resident  [facility]

## 2023-12-06 NOTE — ED Notes (Signed)
 Patient is out of the shower, brought dirty linen to the appropriate place, He is polite, no behavioral issues at this time, He did say that He was bored,Staff gave him a deck of cards.
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
## 2023-12-06 NOTE — ED Notes (Signed)
 Vol / recommend psych inpatient when medically cleared

## 2023-12-06 NOTE — Progress Notes (Signed)
 Pt is hyper-verbal and endorses passive SI. Pt contracts for safety with this Clinical research associate. Pt stating that she is itchy and that the medications aren't working. Pt given education. Pt didn't have any scheduled medications tonight. Pt observed interacting appropriately with staff and peers on the unit. Pt being monitored Q 15 minutes for safety per unit protocol, remains safe on the unit

## 2023-12-06 NOTE — Group Note (Signed)
 Date:  12/06/2023 Time:  9:15 PM  Group Topic/Focus:  Wrap-Up Group:   The focus of this group is to help patients review their daily goal of treatment and discuss progress on daily workbooks.    Participation Level:  Minimal  Participation Quality:  Intrusive  Affect:  Defensive  Cognitive:  Lacking  Insight: Limited  Engagement in Group:  Lacking  Modes of Intervention:  Activity and Discussion  Additional Comments:     Fabiola Holy 12/06/2023, 9:15 PM

## 2023-12-06 NOTE — Progress Notes (Signed)
 Patient complaining about itching, PRN Benadryl given. Pt stated, "this isn't going to do shit". Pt given education.

## 2023-12-06 NOTE — Plan of Care (Signed)
  Problem: Activity: Goal: Interest or engagement in activities will improve Outcome: Progressing Goal: Sleeping patterns will improve Outcome: Progressing   Problem: Safety: Goal: Periods of time without injury will increase Outcome: Progressing   Problem: Education: Goal: Mental status will improve Outcome: Not Progressing   Problem: Coping: Goal: Ability to demonstrate self-control will improve Outcome: Not Progressing

## 2023-12-06 NOTE — Group Note (Signed)
 Date:  12/06/2023 Time:  2:50 PM  Group Topic/Focus:  Activity Group: The focus of the group is to promote activity for the patients and encourage them to go outside to the courtyard for some fresh air and some exercise.    Participation Level:  Active  Participation Quality:  Appropriate  Affect:  Appropriate  Cognitive:  Appropriate  Insight: Appropriate  Engagement in Group:  Engaged  Modes of Intervention:  Activity  Additional Comments:    Marianna Shirk Mairi Stagliano 12/06/2023, 2:50 PM

## 2023-12-06 NOTE — BH Assessment (Signed)
 Patient is to be admitted to [facility] by Dr.  Jadapalle .  Attending Physician will be Dr.  Jadapalle .   Patient has been assigned to room 312, by [facility] Charge Nurse Gigi.     ER staff is aware of the admission: Edwina Gram, ER Secretary   Dr. Vicenta Graft, ER MD  Jenette Mitchell Patient's Nurse  Eldonna Greenspan, Patient Access.

## 2023-12-06 NOTE — Group Note (Signed)
 Recreation Therapy Group Note   Group Topic:Goal Setting  Group Date: 12/06/2023 Start Time: 1000 End Time: 1100 Facilitators: Deatrice Factor, LRT, CTRS Location:  Craft Room  Group Description: Product/process development scientist. Patients were given many different magazines, a glue stick, markers, and a piece of cardstock paper. LRT and pts discussed the importance of having goals in life. LRT and pts discussed the difference between short-term and long-term goals, as well as what a SMART goal is. LRT encouraged pts to create a vision board, with images they picked and then cut out with safety scissors from the magazine, for themselves, that capture their short and long-term goals. LRT encouraged pts to show and explain their vision board to the group.   Goal Area(s) Addressed:  Patient will gain knowledge of short vs. long term goals.  Patient will identify goals for themselves. Patient will practice setting SMART goals. Patient will verbalize their goals to LRT and peers.   Affect/Mood: N/A   Participation Level: Did not attend    Clinical Observations/Individualized Feedback: Patient was not on the unit at the time of group.   Plan: Continue to engage patient in RT group sessions 2-3x/week.   Deatrice Factor, LRT, CTRS 12/06/2023 11:50 AM

## 2023-12-06 NOTE — Progress Notes (Signed)
    12/06/23 1249  Psych Admission Type (Psych Patients Only)  Admission Status Voluntary  Psychosocial Assessment  Patient Complaints Agitation;Anger;Anxiety;Depression;Hopelessness;Helplessness;Panic attack;Sadness;Self-harm thoughts;Worrying;Worthlessness (Patient reports the anger and agitation are minimal. She also complains of generalized itching.)  Eye Contact Brief  Facial Expression Animated  Affect Labile;Preoccupied  Speech Rapid;Logical/coherent  Interaction Sexually inappropriate;Sarcastic;Assertive  Motor Activity Hyperactive  Appearance/Hygiene Unremarkable;In scrubs  Behavior Characteristics Cooperative;Hyperactive  Mood Preoccupied;Labile;Ambivalent  Thought Process  Coherency Circumstantial  Content Preoccupation;Delusions;Blaming others;Ambivalence  Delusions Persecutory;Other (Comment) (Patient has three different personlities)  Perception Derealization (Dissociation)  Hallucination Auditory;Visual (Patient states she can hear and see her other personalities)  Judgment Poor  Confusion None  Danger to Self  Current suicidal ideation? Verbalizes  Self-Injurious Behavior No self-injurious ideation or behavior indicators observed or expressed   Agreement Not to Harm Self Yes  Description of Agreement Verbal  Danger to Others  Danger to Others None reported or observed

## 2023-12-07 ENCOUNTER — Encounter: Payer: Self-pay | Admitting: Psychiatry

## 2023-12-07 DIAGNOSIS — F332 Major depressive disorder, recurrent severe without psychotic features: Secondary | ICD-10-CM | POA: Diagnosis not present

## 2023-12-07 MED ORDER — HYDROCORTISONE 1 % EX CREA
TOPICAL_CREAM | Freq: Four times a day (QID) | CUTANEOUS | Status: DC
Start: 2023-12-07 — End: 2023-12-12
  Administered 2023-12-08: 1 via TOPICAL
  Filled 2023-12-07: qty 28

## 2023-12-07 MED ORDER — FLUOXETINE HCL 20 MG PO CAPS
20.0000 mg | ORAL_CAPSULE | Freq: Every day | ORAL | Status: DC
  Administered 2023-12-07 – 2023-12-09 (×3): 20 mg via ORAL
  Filled 2023-12-07 (×3): qty 1

## 2023-12-07 MED ORDER — HYDROXYZINE HCL 25 MG PO TABS
25.0000 mg | ORAL_TABLET | Freq: Three times a day (TID) | ORAL | Status: DC
  Administered 2023-12-07 – 2023-12-12 (×16): 25 mg via ORAL
  Filled 2023-12-07 (×17): qty 1
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
## 2023-12-07 NOTE — Plan of Care (Signed)
   Problem: Education: Goal: Emotional status will improve Outcome: Progressing Goal: Mental status will improve Outcome: Progressing

## 2023-12-07 NOTE — Group Note (Signed)
 LCSW Group Therapy Note  Group Date: 12/07/2023 Start Time: 1330 End Time: 1445   Type of Therapy and Topic:  Group Therapy: Anger Cues and Responses  Participation Level:  Minimal   Description of Group:   In this group, patients learned how to recognize the physical, cognitive, emotional, and behavioral responses they have to anger-provoking situations.  They identified a recent time they became angry and how they reacted.  They analyzed how their reaction was possibly beneficial and how it was possibly unhelpful.  The group discussed a variety of healthier coping skills that could help with such a situation in the future.  Focus was placed on how helpful it is to recognize the underlying emotions to our anger, because working on those can lead to a more permanent solution as well as our ability to focus on the important rather than the urgent.  Therapeutic Goals: Patients will remember their last incident of anger and how they felt emotionally and physically, what their thoughts were at the time, and how they behaved. Patients will identify how their behavior at that time worked for them, as well as how it worked against them. Patients will explore possible new behaviors to use in future anger situations. Patients will learn that anger itself is normal and cannot be eliminated, and that healthier reactions can assist with resolving conflict rather than worsening situations.  Summary of Patient Progress:   Patient was present in group.  She was attentive in group.  Group contribution was to relate the discussions topics to movie scenes.  Patient was appropriate.  Patient displayed poor insight.    Therapeutic Modalities:   Cognitive Behavioral Therapy    George EYERMAN, LCSW 12/07/2023  2:51 PM

## 2023-12-07 NOTE — Plan of Care (Signed)
?  Problem: Activity: ?Goal: Interest or engagement in activities will improve ?Outcome: Progressing ?  ?Problem: Coping: ?Goal: Ability to verbalize frustrations and anger appropriately will improve ?Outcome: Progressing ?  ?Problem: Health Behavior/Discharge Planning: ?Goal: Compliance with treatment plan for underlying cause of condition will improve ?Outcome: Progressing ?  ?Problem: Safety: ?Goal: Periods of time without injury will increase ?Outcome: Progressing ?  ?

## 2023-12-07 NOTE — Group Note (Signed)
 Date:  12/07/2023 Time:  11:46 AM  Group Topic/Focus:   Goals Group:   The focus of this group is to help patients establish daily goals to achieve during treatment and discuss how the patient can incorporate goal setting into their daily lives to aide in recovery.   Participation Level:  Active  Participation Quality:  Appropriate  Affect:  Appropriate  Cognitive:  Appropriate  Insight: Appropriate  Engagement in Group:  Engaged  Modes of Intervention:  Discussion and Education  Additional Comments:    Larhonda Dettloff A Kimimila Tauzin 12/07/2023, 11:46 AM

## 2023-12-07 NOTE — BH IP Treatment Plan (Signed)
 Interdisciplinary Treatment and Diagnostic Plan Update  12/07/2023 Time of Session: 12:19 PM  George [name] MRN: [id number]  Principal Diagnosis: MDD (major depressive disorder), recurrent episode, severe (HCC)  Secondary Diagnoses: Principal Problem:   MDD (major depressive disorder), recurrent episode, severe (HCC)   Current Medications:  Current Facility-Administered Medications  Medication Dose Route Frequency Provider Last Rate Last Admin   acetaminophen (TYLENOL) tablet 650 mg  650 mg Oral Q6H PRN Gilman Lade, NP       alum & mag hydroxide-simeth (MAALOX/MYLANTA) 200-200-20 MG/5ML suspension 30 mL  30 mL Oral Q4H PRN Lorrene Rosser, Veronique M, NP       diphenhydrAMINE (BENADRYL) capsule 50 mg  50 mg Oral Q6H PRN Lorrene Rosser, Veronique M, NP   50 mg at 12/06/23 2014   FLUoxetine (PROZAC) capsule 20 mg  20 mg Oral Daily Jadapalle, Sree, MD   20 mg at 12/07/23 1249   hydrOXYzine (ATARAX) tablet 25 mg  25 mg Oral TID Jadapalle, Sree, MD   25 mg at 12/07/23 1249   magnesium hydroxide (MILK OF MAGNESIA) suspension 30 mL  30 mL Oral Daily PRN Byungura, Veronique M, NP       traZODone (DESYREL) tablet 50 mg  50 mg Oral QHS PRN Byungura, Veronique M, NP       PTA Medications: Medications Prior to Admission  Medication Sig Dispense Refill Last Dose/Taking   emtricitabine-tenofovir (TRUVADA) 200-300 MG tablet Take 1 tablet by mouth daily. (Patient not taking: Reported on 12/06/2023)      estradiol (ESTRACE) 2 MG tablet Take 1.5 mg by mouth 2 (two) times daily. (Patient not taking: Reported on 12/06/2023)      FLUoxetine (PROZAC) 20 MG capsule Take 1 capsule (20 mg total) by mouth daily. (Patient not taking: Reported on 12/06/2023) 30 capsule 0    progesterone (PROMETRIUM) 100 MG capsule Take 100 mg by mouth at bedtime. (Patient not taking: Reported on 12/06/2023)      spironolactone (ALDACTONE) 100 MG tablet Take 200 mg by mouth at bedtime. (Patient not taking: Reported on 12/06/2023)        Patient Stressors: Financial difficulties   Loss of living arrangement   Occupational concerns   Other: "Fuck Jalene Mayor" Patient reports the "political climate" is her main stressor    Patient Strengths: Average or above average intelligence  Supportive family/friends  Other: Patient reports she cannot share her top 3 strengths because "they are all number 1." When asked to list 3 of them, she states "I cannot because they change on a daily basis."   Treatment Modalities: Medication Management, Group therapy, Case management,  1 to 1 session with clinician, Psychoeducation, Recreational therapy.   Physician Treatment Plan for Primary Diagnosis: MDD (major depressive disorder), recurrent episode, severe (HCC) Long Term Goal(s): Improvement in symptoms so as ready for discharge   Short Term Goals: Ability to identify changes in lifestyle to reduce recurrence of condition will improve Ability to verbalize feelings will improve Ability to disclose and discuss suicidal ideas Ability to demonstrate self-control will improve Ability to identify and develop effective coping behaviors will improve Ability to maintain clinical measurements within normal limits will improve Compliance with prescribed medications will improve  Medication Management: Evaluate patient's response, side effects, and tolerance of medication regimen.  Therapeutic Interventions: 1 to 1 sessions, Unit Group sessions and Medication administration.  Evaluation of Outcomes: Not Met  Physician Treatment Plan for Secondary Diagnosis: Principal Problem:   MDD (major depressive disorder), recurrent episode, severe (HCC)  Long  Term Goal(s): Improvement in symptoms so as ready for discharge   Short Term Goals: Ability to identify changes in lifestyle to reduce recurrence of condition will improve Ability to verbalize feelings will improve Ability to disclose and discuss suicidal ideas Ability to demonstrate  self-control will improve Ability to identify and develop effective coping behaviors will improve Ability to maintain clinical measurements within normal limits will improve Compliance with prescribed medications will improve     Medication Management: Evaluate patient's response, side effects, and tolerance of medication regimen.  Therapeutic Interventions: 1 to 1 sessions, Unit Group sessions and Medication administration.  Evaluation of Outcomes: Not Met   RN Treatment Plan for Primary Diagnosis: MDD (major depressive disorder), recurrent episode, severe (HCC) Long Term Goal(s): Knowledge of disease and therapeutic regimen to maintain health will improve  Short Term Goals: Ability to verbalize frustration and anger appropriately will improve, Ability to demonstrate self-control, Ability to participate in decision making will improve, Ability to verbalize feelings will improve, Ability to disclose and discuss suicidal ideas, and Ability to identify and develop effective coping behaviors will improve  Medication Management: RN will administer medications as ordered by provider, will assess and evaluate patient's response and provide education to patient for prescribed medication. RN will report any adverse and/or side effects to prescribing provider.  Therapeutic Interventions: 1 on 1 counseling sessions, Psychoeducation, Medication administration, Evaluate responses to treatment, Monitor vital signs and CBGs as ordered, Perform/monitor CIWA, COWS, AIMS and Fall Risk screenings as ordered, Perform wound care treatments as ordered.  Evaluation of Outcomes: Not Met   LCSW Treatment Plan for Primary Diagnosis: MDD (major depressive disorder), recurrent episode, severe (HCC) Long Term Goal(s): Safe transition to appropriate next level of care at discharge, Engage patient in therapeutic group addressing interpersonal concerns.  Short Term Goals: Engage patient in aftercare planning with  referrals and resources, Increase social support, Increase ability to appropriately verbalize feelings, Increase emotional regulation, Facilitate acceptance of mental health diagnosis and concerns, Facilitate patient progression through stages of change regarding substance use diagnoses and concerns, Identify triggers associated with mental health/substance abuse issues, and Increase skills for wellness and recovery  Therapeutic Interventions: Assess for all discharge needs, 1 to 1 time with Social worker, Explore available resources and support systems, Assess for adequacy in community support network, Educate family and significant other(s) on suicide prevention, Complete Psychosocial Assessment, Interpersonal group therapy.  Evaluation of Outcomes: Not Met   Progress in Treatment: Attending groups: Yes. Participating in groups: Yes. Taking medication as prescribed: Yes. Toleration medication: Yes. Family/Significant other contact made: No, will contact:  CSW to contact patient's mother George [name].  Patient understands diagnosis: Yes. Discussing patient identified problems/goals with staff: Yes. Medical problems stabilized or resolved: Yes. and No. Denies suicidal/homicidal ideation: Yes. Issues/concerns per patient self-inventory: No. None Other: None   New problem(s) identified: Yes, Describe:  Patient reports that she is itching very badly which increases her suicidal ideation.   New Short Term/Long Term Goal(s):detox, elimination of symptoms of psychosis, medication management for mood stabilization; elimination of SI thoughts; development of comprehensive mental wellness/sobriety plan.    Patient Goals: "To get rid of my suicidal ideations."   Discharge Plan or Barriers: CSW to assist in the development of appropriate discharge plan.   Reason for Continuation of Hospitalization: Aggression Anxiety Delusions  Medical Issues Suicidal ideation  Estimated Length of Stay: 1-7  days.  Last 3 [place] Suicide Severity Risk Score: Flowsheet Row Admission (Current) from 12/06/2023 in [facility]  ED from 12/05/2023 in [facility] Emergency Department [facility] ED from 08/01/2023 in [facility] Emergency Department at [facility]  C-SSRS RISK CATEGORY High Risk Moderate Risk No Risk       Last PHQ 2/9 Scores:     No data to display          Scribe for Treatment Team: Roselle Conner, LCSW 12/07/2023 3:03 PM

## 2023-12-07 NOTE — Progress Notes (Signed)
 Patient presents with labile mood but overall cooperative. Patient reports she slept fairly last night. Appetite fair and energy level normal. Patient reports passive SI but denies HI and A/V/H with no plan or intent. Patient states depression and anxiety are a 9 out of 10. Main concern is "itching." Patient states Benadryl doesn't help and spoke with NP today to have some more medication ordered. Patient observed joining activities and playing basketball outside. No s/s of current distress.

## 2023-12-07 NOTE — H&P (Signed)
 Psychiatric Admission Assessment Adult  Patient Identification: George [name] MRN:  [id number] Date of Evaluation:  12/07/2023 Chief Complaint:  MDD (major depressive disorder), recurrent episode, severe (HCC) [F33.2]    History of Present Illness: Patient is a 49 transgender male, born male and now identifies as a male and prefers pronouns as she/her/hers who is presenting to the ED for a rash and SI. Pt states she has been itching since last night in her right inner thigh and all over that is severe. Pt also states she has been having SI. Pt has not tried any Benadryl. Pt states she is having current issues that is making them have SI. Patient is admitted to adult psych unit with Q15 min safety monitoring. Multidisciplinary team approach is offered. Medication management; group/milieu therapy is offered.   Patient is noted to be sitting in the front of the patient's room and being emotional and talking to the..  Provider interrupted the conversation to interview the patient which made patient upset.  Patient displayed regressive behaviors throughout the interview, stomping her feet.  She reports she is here for itching and she needs medication for itching and Benadryl is not helping her.  Provider tried to discuss hydroxyzine for her itching but patient continues to curse throughout the interview.  Provider had to boundaries on her behaviors and redirected to answer the questions.  Patient reports chronic depression, hopelessness with reduced sleep and appetite.  She reports that she always had suicidal thoughts throughout her life and they have been the same.  She denies having any intent to die and any specific plan to kill herself.  She denies homicidal ideation.  She denies any self harming behaviors.  She reports having anxiety and panic attacks all her life.  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  No grandiose delusions noted.  When asked about  trauma she denies having any nightmares but has some flashbacks but declined to give any details.  She reports she has been homeless in the last few months with no income and job she is unable to afford for her appointments and medications.  She reports having [employer] and feels that medication only at [facility].  Total Time spent with patient: 1 hour Sleep  Sleep:Sleep: Fair  Past Psychiatric History:  Psychiatric History:  Information collected from patient  Prev Dx/Sx: Depression, anxiety, borderline personality, PTSD, ADHD Current Psych Provider: None currently Home Meds (current): Prozac Previous Med Trials: Abilify, Risperdal, Seroquel, Depakote, Intuniv, Zyprexa, Lexapro, lithium, Tegretol-reports side effects to all the medications  Therapy: None currently  Prior Psych Hospitalization: 2024 Prior Self Harm: 2024 Prior Violence: Denies  Family Psych History: Depression and alcohol use in family members Family Hx suicide: Denies  Social History:  Developmental Hx: Normal Educational Hx: Academic librarian Hx: Unemployed Legal Hx: None Living Situation: Homeless Spiritual Hx: Denies Access to weapons/lethal means: Denies  Substance History Alcohol: Denies  Tobacco: Denies Illicit drugs: Denies Prescription drug abuse: Denies Rehab hx: Denies Is the patient at risk to self? No.  Has the patient been a risk to self in the past 6 months? No.  Has the patient been a risk to self within the distant past? Yes.    Is the patient a risk to others? No.  Has the patient been a risk to others in the past 6 months? No.  Has the patient been a risk to others within the distant past? No.   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 No pertinent family history.  Social History:  Social History   Substance and Sexual Activity  Alcohol Use No     Social History   Substance and Sexual Activity  Drug Use Never      Allergies:   Allergies  Allergen Reactions   Lithium Other (See Comments)    Liver Inflamation   Tegretol [Carbamazepine] Other (See Comments)    Liver Inflamation   Lab Results:  Results for orders placed or performed during the hospital encounter of 12/05/23 (from the past 48 hours)  Comprehensive metabolic panel     Status: Abnormal   Collection Time: 12/05/23  4:40 PM  Result Value Ref Range   Sodium 136 135 - 145 mmol/L   Potassium 4.0 3.5 - 5.1 mmol/L   Chloride 104 98 - 111 mmol/L   CO2 22 22 - 32 mmol/L   Glucose, Bld 93 70 - 99 mg/dL    Comment: Glucose reference range applies only to samples taken after fasting for at least 8 hours.   BUN 15 6 - 20 mg/dL   Creatinine, Ser 8.84 0.61 - 1.24 mg/dL   Calcium 8.8 (L) 8.9 - 10.3 mg/dL   Total Protein 6.3 (L) 6.5 - 8.1 g/dL   Albumin 4.0 3.5 - 5.0 g/dL   AST 19 15 - 41 U/L   ALT 18 0 - 44 U/L   Alkaline Phosphatase 45 38 - 126 U/L   Total Bilirubin 0.7 0.0 - 1.2 mg/dL   GFR, Estimated >16 >60 mL/min    Comment: (NOTE) Calculated using the CKD-EPI Creatinine Equation (2021)    Anion gap 10 5 - 15    Comment: Performed at [facility], [address] [place], [place] [address]  Ethanol     Status: None   Collection Time: 12/05/23  4:40 PM  Result Value Ref Range   Alcohol, Ethyl (B)  <10 <10 mg/dL    Comment: (NOTE) Lowest detectable limit for serum alcohol is 10 mg/dL.  For medical purposes only. Performed at [facility], [address] [place], [place] [address]   Salicylate level     Status: Abnormal   Collection Time: 12/05/23  4:40 PM  Result Value Ref Range   Salicylate Lvl <7.0 (L) 7.0 - 30.0 mg/dL    Comment: Performed at [facility], [address] [place], [place] [address]  Acetaminophen level     Status: Abnormal   Collection Time: 12/05/23  4:40 PM  Result Value Ref Range   Acetaminophen (Tylenol), Serum <10 (L) 10 - 30 ug/mL    Comment: (NOTE) Therapeutic concentrations vary significantly. A range of 10-30 ug/mL  may be an effective concentration for many patients. However, some  are  best treated at concentrations outside of this range. Acetaminophen concentrations >150 ug/mL at 4 hours after ingestion  and >50 ug/mL at 12 hours after ingestion are often associated with  toxic reactions.  Performed at [facility], [address] [place], [place] [address]   cbc     Status: Abnormal   Collection Time: 12/05/23  4:40 PM  Result Value Ref Range   WBC 14.7 (H) 4.0 - 10.5 K/uL   RBC 5.64 4.22 - 5.81 MIL/uL   Hemoglobin 17.5 (H) 13.0 - 17.0 g/dL   HCT 60.4 54.0 - 98.1 %   MCV 87.2 80.0 - 100.0 fL   MCH 31.0 26.0 - 34.0 pg   MCHC 35.6 30.0 - 36.0 g/dL   RDW 19.1 47.8 - 29.5 %   Platelets 376 150 - 400 K/uL   nRBC 0.0 0.0 - 0.2 %    Comment: Performed at [facility], [address] [place], [place] [address]  Urine Drug Screen, Qualitative     Status: None   Collection Time: 12/05/23  5:46 PM  Result Value Ref Range   Tricyclic, Ur Screen NONE DETECTED NONE DETECTED   Amphetamines, Ur Screen NONE DETECTED NONE DETECTED   MDMA (Ecstasy)Ur Screen NONE DETECTED NONE DETECTED   Cocaine Metabolite,Ur [place] NONE DETECTED NONE DETECTED   Opiate, Ur Screen NONE DETECTED NONE DETECTED   Phencyclidine  (PCP) Ur S NONE DETECTED NONE DETECTED   Cannabinoid 50 Ng, Ur [place] NONE DETECTED NONE DETECTED   Barbiturates, Ur Screen NONE DETECTED NONE DETECTED   Benzodiazepine, Ur Scrn NONE DETECTED NONE DETECTED   Methadone Scn, Ur NONE DETECTED NONE DETECTED    Comment: (NOTE) Tricyclics + metabolites, urine    Cutoff 1000 ng/mL Amphetamines + metabolites, urine  Cutoff 1000 ng/mL MDMA (Ecstasy), urine              Cutoff 500 ng/mL Cocaine Metabolite, urine          Cutoff 300 ng/mL Opiate + metabolites, urine        Cutoff 300 ng/mL Phencyclidine (PCP), urine         Cutoff 25 ng/mL Cannabinoid, urine                 Cutoff 50 ng/mL Barbiturates + metabolites, urine  Cutoff 200 ng/mL Benzodiazepine, urine              Cutoff 200 ng/mL Methadone, urine                   Cutoff 300 ng/mL  The urine drug screen provides only a preliminary, unconfirmed analytical test result and should not be used for non-medical purposes. Clinical consideration and professional judgment should be applied to any positive drug screen result due to possible interfering substances. A more specific alternate chemical method must be used in order to obtain a confirmed analytical result. Gas chromatography / mass spectrometry (GC/MS) is the preferred confirm atory method. Performed at [facility], [address] [place], [place] [address]     Blood Alcohol level:  Lab Results  Component Value Date   [facility] <10 12/05/2023   ETH <10 05/07/2023    Metabolic Disorder Labs:  Lab Results  Component Value Date   HGBA1C 5.1 05/10/2023   MPG 100 05/10/2023   No results found for: "PROLACTIN" Lab Results  Component Value Date   CHOL 193 05/10/2023   TRIG 77 05/10/2023   HDL 44 05/10/2023   CHOLHDL 4.4 05/10/2023  VLDL 15 05/10/2023   LDLCALC 134 (H) 05/10/2023    Current Medications: Current Facility-Administered Medications  Medication Dose Route Frequency Provider Last Rate Last Admin    acetaminophen (TYLENOL) tablet 650 mg  650 mg Oral Q6H PRN Marlou Sa, NP       alum & mag hydroxide-simeth (MAALOX/MYLANTA) 200-200-20 MG/5ML suspension 30 mL  30 mL Oral Q4H PRN Rayburn Go, Veronique M, NP       diphenhydrAMINE (BENADRYL) capsule 50 mg  50 mg Oral Q6H PRN Rayburn Go, Veronique M, NP   50 mg at 12/06/23 2014   FLUoxetine (PROZAC) capsule 20 mg  20 mg Oral Daily Verner Chol, MD       hydrOXYzine (ATARAX) tablet 25 mg  25 mg Oral TID Verner Chol, MD       magnesium hydroxide (MILK OF MAGNESIA) suspension 30 mL  30 mL Oral Daily PRN Rayburn Go, Veronique M, NP       traZODone (DESYREL) tablet 50 mg  50 mg Oral QHS PRN Marlou Sa, NP       PTA Medications: Medications Prior to Admission  Medication Sig Dispense Refill Last Dose/Taking   emtricitabine-tenofovir (TRUVADA) 200-300 MG tablet Take 1 tablet by mouth daily. (Patient not taking: Reported on 12/06/2023)      estradiol (ESTRACE) 2 MG tablet Take 1.5 mg by mouth 2 (two) times daily. (Patient not taking: Reported on 12/06/2023)      FLUoxetine (PROZAC) 20 MG capsule Take 1 capsule (20 mg total) by mouth daily. (Patient not taking: Reported on 12/06/2023) 30 capsule 0    progesterone (PROMETRIUM) 100 MG capsule Take 100 mg by mouth at bedtime. (Patient not taking: Reported on 12/06/2023)      spironolactone (ALDACTONE) 100 MG tablet Take 200 mg by mouth at bedtime. (Patient not taking: Reported on 12/06/2023)       Psychiatric Specialty Exam:  Presentation  General Appearance:  Appropriate for Environment  Eye Contact: Fair  Speech: Clear and Coherent  Speech Volume: Normal    Mood and Affect  Mood: Anxious; Irritable  Affect: Constricted   Thought Process  Thought Processes: Coherent  Descriptions of Associations:Intact  Orientation:Full (Time, Place and Person)  Thought Content:Illogical  Hallucinations:Hallucinations: None  Ideas of Reference:None  Suicidal  Thoughts:Suicidal Thoughts: Yes, Passive ([place]) SI Passive Intent and/or Plan: Without Intent; Without Plan; Without Means to Carry Out  Homicidal Thoughts:Homicidal Thoughts: No   Sensorium  Memory: Immediate Fair; Recent Fair; Remote Poor  Judgment: Impaired  Insight: Shallow   Executive Functions  Concentration: Fair  Attention Span: Fair  Recall: [employer] of Knowledge: Fair  Language: Fair   Psychomotor Activity  Psychomotor Activity: Psychomotor Activity: Normal   Assets  Assets: Communication Skills; Desire for Improvement; Physical Health; Resilience    Musculoskeletal: Strength & Muscle Tone: within normal limits Gait & Station: normal  Physical Exam: Physical Exam Vitals and nursing note reviewed.  HENT:     Head: Normocephalic.     Nose: Nose normal.  Eyes:     Pupils: Pupils are equal, round, and reactive to light.  Cardiovascular:     Rate and Rhythm: Normal rate.  Pulmonary:     Effort: Pulmonary effort is normal.  Abdominal:     General: Bowel sounds are normal.  Musculoskeletal:        General: Normal range of motion.  Skin:    General: Skin is warm.  Neurological:     General: No focal deficit present.  Mental Status: She is alert.    Review of Systems  Constitutional: Negative.   HENT: Negative.    Eyes: Negative.   Cardiovascular: Negative.   Gastrointestinal: Negative.   Skin: Negative.   Neurological: Negative.    Blood pressure (!) 98/58, pulse (!) 57, temperature (!) 96.8 F (36 C), resp. rate 16, height 5\' 2"  (1.575 m), weight 53.3 kg, SpO2 100%. Body mass index is 21.49 kg/m.  Principal Diagnosis: MDD (major depressive disorder), recurrent episode, severe (HCC) Diagnosis:  Principal Problem:   MDD (major depressive disorder), recurrent episode, severe (HCC) Borderline personality disorder  Clinical Decision Making: Patient with depression, anxiety with borderline personality traits presented to  the ED with chronic SI exacerbated by itchiness going on for last 3 days.  Patient is impulsive, irritable and disrespectful demanding treatment for itchiness and declines all other psychotropic medications stating she is allergic to SSRIs, antipsychotics, mood stabilizers.  Patient is displaying safe behaviors on the unit.  Will continue to monitor the need for inpatient psychiatric hospitalization.  Treatment Plan Summary:  Safety and Monitoring:             -- Voluntary admission to inpatient psychiatric unit for safety, stabilization and treatment             -- Daily contact with patient to assess and evaluate symptoms and progress in treatment             -- Patient's case to be discussed in multi-disciplinary team meeting             -- Observation Level: q15 minute checks             -- Vital signs:  q12 hours             -- Precautions: suicide, elopement, and assault   2. Psychiatric Diagnoses and Treatment:               Continue home medication Prozac Patient is reportedly allergic to other mood stabilizers like Depakote, Tegretol, like Lamictal and antipsychotics as mood stabilizers like Abilify, Risperdal, Seroquel etc.   -- The risks/benefits/side-effects/alternatives to this medication were discussed in detail with the patient and time was given for questions. The patient consents to medication trial.                -- Metabolic profile and EKG monitoring obtained while on an atypical antipsychotic (BMI: Lipid Panel: HbgA1c: QTc:)              -- Encouraged patient to participate in unit milieu and in scheduled group therapies                            3. Medical Issues Being Addressed:      4. Discharge Planning:              -- Social work and case management to assist with discharge planning and identification of hospital follow-up needs prior to discharge             -- Estimated LOS: 1 to 2 days             -- Discharge Concerns: Need to establish a safety plan;  Medication compliance and effectiveness             -- Discharge Goals: Return home with outpatient referrals follow ups  Physician Treatment Plan for Primary Diagnosis: MDD (major depressive disorder), recurrent episode, severe (HCC) Long  Term Goal(s): Improvement in symptoms so as ready for discharge  Short Term Goals: Ability to identify changes in lifestyle to reduce recurrence of condition will improve, Ability to verbalize feelings will improve, Ability to disclose and discuss suicidal ideas, Ability to demonstrate self-control will improve, and Ability to identify and develop effective coping behaviors will improve  Physician Treatment Plan for Secondary Diagnosis: Principal Problem:   MDD (major depressive disorder), recurrent episode, severe (HCC)  Long Term Goal(s): Improvement in symptoms so as ready for discharge  Short Term Goals: Ability to identify changes in lifestyle to reduce recurrence of condition will improve, Ability to verbalize feelings will improve, Ability to disclose and discuss suicidal ideas, Ability to demonstrate self-control will improve, Ability to identify and develop effective coping behaviors will improve, Ability to maintain clinical measurements within normal limits will improve, and Compliance with prescribed medications will improve  I certify that inpatient services furnished can reasonably be expected to improve the patient's condition.    Deondria Puryear, MD 4/16/202512:14 PM

## 2023-12-07 NOTE — [facility] Suicide Risk Assessment (Signed)
 [facility] INPATIENT:  Family/Significant Other Suicide Prevention Education  Suicide Prevention Education:  Contact Attempts: Drury Ardizzone, [phone], Mother, (name of family member/significant other) has been identified by the patient as the family member/significant other with whom the patient will be residing, and identified as the person(s) who will aid the patient in the event of a mental health crisis.  With written consent from the patient, two attempts were made to provide suicide prevention education, prior to and/or following the patient's discharge.  We were unsuccessful in providing suicide prevention education.  A suicide education pamphlet was given to the patient to share with family/significant other.  Date and time of first attempt: 12/07/2023 at 3:38PM Date and time of second attempt: second attempt is needed  CSW left HIPAA compliant voicemail.  George [name] 12/07/2023, 3:37 PM

## 2023-12-07 NOTE — [facility] Suicide Risk Assessment (Signed)
 [facility] INPATIENT:  Family/Significant Other Suicide Prevention Education  Suicide Prevention Education:  Education Completed; Rakeem Colley, [phone],  (name of family member/significant other) has been identified by the patient as the family member/significant other with whom the patient will be residing, and identified as the person(s) who will aid the patient in the event of a mental health crisis (suicidal ideations/suicide attempt).  With written consent from the patient, the family member/significant other has been provided the following suicide prevention education, prior to the and/or following the discharge of the patient.  The suicide prevention education provided includes the following: Suicide risk factors Suicide prevention and interventions National Suicide Hotline telephone number [facility] assessment telephone number [facility] Emergency Assistance [facility] and/or Residential Mobile Crisis Unit telephone number  Request made of family/significant other to: Remove weapons (e.g., guns, rifles, knives), all items previously/currently identified as safety concern.   Remove drugs/medications (over-the-counter, prescriptions, illicit drugs), all items previously/currently identified as a safety concern.  The family member/significant other verbalizes understanding of the suicide prevention education information provided.  The family member/significant other agrees to remove the items of safety concern listed above.  MARQUAIL BRADWELL 12/07/2023, 3:40 PM

## 2023-12-07 NOTE — [facility] Counselor (Signed)
 CSW spoke with Sharon December, [phone], mother.   She reports that patient is not a danger to self or others.  She reports that patient does not have access to weapons at this time.   Shasta Deist, MSW, LCSW 12/07/2023 3:42 PM

## 2023-12-07 NOTE — Group Note (Signed)
 Date:  12/07/2023 Time:  9:21 PM  Group Topic/Focus:  Wrap-Up Group:   The focus of this group is to help patients review their daily goal of treatment and discuss progress on daily workbooks.    Participation Level:  Active  Participation Quality:  Appropriate  Affect:  Appropriate  Cognitive:  Appropriate  Insight: Appropriate  Engagement in Group:  Limited  Modes of Intervention:  Discussion  Additional Comments:     George [name] 12/07/2023, 9:21 PM

## 2023-12-08 NOTE — Progress Notes (Signed)
 On engagement Pt was dismissive.  States, "I been saying the same thing over and over; your not listening.  I am suicidal because I am itching.  When I get the medication, and I stop itching, I will be fine."  Pt was later given the hydrocortisone cream.  Upon receiving the cream, ne noted "I am fine with every staff, but you; I don't know you."  Pt was reassured of staff support.    12/07/23 2030  Psych Admission Type (Psych Patients Only)  Admission Status Voluntary  Psychosocial Assessment  Patient Complaints Anxiety;Hyperactivity  Eye Contact Brief  Facial Expression Flat  Affect Labile  Speech Rapid  Interaction Assertive  Motor Activity Fidgety  Appearance/Hygiene Unremarkable  Behavior Characteristics Cooperative  Mood Labile  Thought Process  Coherency Circumstantial  Content Blaming self  Delusions None reported or observed  Perception UTA (pt uncooperative)  Hallucination UTA  Judgment Poor  Confusion None  Danger to Self  Current suicidal ideation? Passive  Self-Injurious Behavior No self-injurious ideation or behavior indicators observed or expressed   Agreement Not to Harm Self Yes  Description of Agreement Verbal  Danger to Others  Danger to Others None reported or observed

## 2023-12-08 NOTE — Progress Notes (Signed)
 Patient appears less irritable today and denies current SI,HI, and A/V/H with no plan or intent. Patient went outside and socialized appropriately. Patient is med compliant. No s/s of current distress.     12/08/23 0837  Psych Admission Type (Psych Patients Only)  Admission Status Voluntary  Psychosocial Assessment  Patient Complaints Anxiety  Eye Contact Brief  Facial Expression Flat  Affect Labile  Speech Rapid  Interaction Assertive  Motor Activity Fidgety  Appearance/Hygiene Unremarkable  Behavior Characteristics Cooperative;Anxious  Mood Labile  Thought Process  Coherency Circumstantial  Content Blaming others  Delusions None reported or observed  Perception Hallucinations;Derealization  Hallucination Auditory;Visual  Judgment Poor  Confusion None  Danger to Self  Current suicidal ideation? Passive  Self-Injurious Behavior No self-injurious ideation or behavior indicators observed or expressed   Agreement Not to Harm Self Yes  Description of Agreement verbal  Danger to Others  Danger to Others None reported or observed

## 2023-12-08 NOTE — Progress Notes (Addendum)
 [facility] MD Progress Note  12/08/2023 5:08 PM George [name]  MRN:  [id number] Subjective:  "I still itch, I just ignore" Principal Problem: MDD (major depressive disorder), recurrent episode, severe (HCC) Diagnosis: Principal Problem:   MDD (major depressive disorder), recurrent episode, severe (HCC)  Patient is a 71 transgender male, born male and now identifies as a male and prefers pronouns as she/her/hers who is presenting to the ED for a rash and SI. Pt stateed she has been itching since last night in her right inner thigh and all over that is severe. Pt also stated she has been having SI. Pt has not tried any Benadryl. Pt stated she was having current issues that is making her have SI. Patient is admitted to adult psych unit with Q15 min safety monitoring. Multidisciplinary team approach is offered. Medication management; group/milieu therapy is offered.    Patient is observed in the dayroom with peers, eating dinner She reports having good appetite "but this food is not that good, I can cook better, I just eat to eat". She is actively engaged in conversations with peers. She is receptive and accepts to participate in the assessment. Privacy provided. She is alert and oriented x 4. Patient  keeps blaming  others, minimizing their abilities to do great. Patient reports that the itching is still there and the creme is not helping "nothing helps, I just ignore". Patient does not appear to be in distress. She reports no additional concerns.  She is brief in her responses and reporting that "there is nothing you can do to help me, I will just ignore". Patient currently denies SI/HI/AVH.  She reports no additional concerns.   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 No pertinent family history. Family Psychiatric  History: NA Social History:  Social History   Substance and Sexual Activity  Alcohol Use No     Social History   Substance and Sexual Activity  Drug Use Never    Social History   Socioeconomic History   Marital status: Single    Spouse name: Not on file   Number of children: Not on file   Years of education: Not on file   Highest education level: Not on file  Occupational History   Not on file  Tobacco Use   Smoking status: Never   Smokeless tobacco: Never  Vaping Use   Vaping status: Never Used  Substance and Sexual Activity   Alcohol use: No   Drug use: Never   Sexual activity: Yes  Other Topics Concern   Not on file  Social History Narrative   Not on file   Social Drivers of Health   Financial Resource Strain: Not on file  Food Insecurity: Food Insecurity Present (12/06/2023)   Hunger Vital Sign    Worried About Running Out of Food in the Last Year: Often true    Ran Out of Food in the Last Year: Often true  Transportation Needs: Unmet Transportation Needs (12/06/2023)   PRAPARE -  Administrator, Civil Service (Medical): Yes    Lack of Transportation (Non-Medical): Yes  Physical Activity: Not on file  Stress: Not on file  Social Connections: Not on file   Additional Social History:                         Sleep: Fair  Appetite:  Fair  Current Medications: Current Facility-Administered Medications  Medication Dose Route Frequency Provider Last Rate Last Admin   acetaminophen (TYLENOL) tablet 650 mg  650 mg Oral Q6H PRN Gilman Lade, NP       alum & mag hydroxide-simeth (MAALOX/MYLANTA) 200-200-20 MG/5ML suspension 30 mL  30 mL Oral Q4H PRN Lorrene Rosser, Alizee Maple M, NP        diphenhydrAMINE (BENADRYL) capsule 50 mg  50 mg Oral Q6H PRN Lorrene Rosser, Erienne Spelman M, NP   50 mg at 12/06/23 2014   FLUoxetine (PROZAC) capsule 20 mg  20 mg Oral Daily Jadapalle, Sree, MD   20 mg at 12/08/23 0837   hydrocortisone cream 1 %   Topical QID Tracy Kinner M, NP   1 Application at 12/08/23 1237   hydrOXYzine (ATARAX) tablet 25 mg  25 mg Oral TID Jadapalle, Sree, MD   25 mg at 12/08/23 1237   magnesium hydroxide (MILK OF MAGNESIA) suspension 30 mL  30 mL Oral Daily PRN Thersea Manfredonia M, NP       traZODone (DESYREL) tablet 50 mg  50 mg Oral QHS PRN Gilman Lade, NP        Lab Results: No results found for this or any previous visit (from the past 48 hours).  Blood Alcohol level:  Lab Results  Component Value Date   ETH <10 12/05/2023   ETH <10 05/07/2023    Metabolic Disorder Labs: Lab Results  Component Value Date   HGBA1C 5.1 05/10/2023   MPG 100 05/10/2023   No results found for: "PROLACTIN" Lab Results  Component Value Date   CHOL 193 05/10/2023   TRIG 77 05/10/2023   HDL 44 05/10/2023   CHOLHDL 4.4 05/10/2023   VLDL 15 05/10/2023   LDLCALC 134 (H) 05/10/2023    Physical Findings: AIMS:  , ,  ,  ,    CIWA:    COWS:     Musculoskeletal: Strength & Muscle Tone: within normal limits Gait & Station: normal Patient leans: N/A  Psychiatric Specialty Exam:  Presentation  General Appearance:  Appropriate for Environment  Eye Contact: Fair  Speech: Clear and Coherent  Speech Volume: Normal  Handedness: Right   Mood and Affect  Mood: Anxious  Affect: Congruent   Thought Process  Thought Processes: Coherent  Descriptions of Associations:Intact  Orientation:Full (Time, Place and Person)  Thought Content:Obsessions  History of Schizophrenia/Schizoaffective disorder:No  Duration of Psychotic Symptoms:No data recorded Hallucinations:Hallucinations: None  Ideas of Reference:None  Suicidal Thoughts:Suicidal  Thoughts: Yes, Passive SI Passive Intent and/or Plan: Without Plan  Homicidal Thoughts:Homicidal Thoughts: No   Sensorium  Memory: Immediate Fair; Recent Fair; Remote Fair  Judgment: Poor  Insight: Fair   Chartered certified accountant: Fair  Attention Span: Fair  Recall: [employer] of Knowledge: Fair  Language: Fair   Psychomotor Activity  Psychomotor Activity: Psychomotor Activity: Normal   Assets  Assets: Manufacturing systems engineer; Desire for Improvement; Physical Health   Sleep  Sleep: Sleep: Fair Number of Hours of Sleep: 5    Physical Exam: Physical Exam Constitutional:      Appearance: Normal  appearance.  HENT:     Head: Normocephalic and atraumatic.     Right Ear: Tympanic membrane normal.     Left Ear: Tympanic membrane normal.     Nose: Nose normal.     Mouth/Throat:     Mouth: Mucous membranes are moist.  Eyes:     Extraocular Movements: Extraocular movements intact.     Pupils: Pupils are equal, round, and reactive to light.  Cardiovascular:     Rate and Rhythm: Normal rate.     Pulses: Normal pulses.  Pulmonary:     Effort: Pulmonary effort is normal.  Musculoskeletal:        General: Normal range of motion.     Cervical back: Normal range of motion and neck supple.  Neurological:     General: No focal deficit present.     Mental Status: She is alert and oriented to person, place, and time.    Review of Systems  Constitutional: Negative.   HENT: Negative.    Eyes: Negative.   Respiratory: Negative.    Cardiovascular: Negative.   Gastrointestinal: Negative.   Genitourinary: Negative.   Musculoskeletal: Negative.   Skin: Negative.   Neurological: Negative.   Endo/Heme/Allergies: Negative.   Psychiatric/Behavioral:  Positive for depression.    Blood pressure (!) 90/57, pulse 62, temperature (!) 97 F (36.1 C), resp. rate 18, height 5\' 2"  (1.575 m), weight 53.3 kg, SpO2 100%. Body mass index is 21.49  kg/m.   Treatment Plan Summary: Daily contact with patient to assess and evaluate symptoms and progress in treatment and Medication management  Elston Halsted, NP 12/08/2023, 5:08 PM

## 2023-12-08 NOTE — Progress Notes (Signed)
   12/08/23 2000  Psych Admission Type (Psych Patients Only)  Admission Status Voluntary  Psychosocial Assessment  Patient Complaints Anxiety  Eye Contact Brief  Facial Expression Flat  Affect Labile  Speech Logical/coherent  Interaction Assertive  Motor Activity Fidgety  Appearance/Hygiene Unremarkable  Behavior Characteristics Cooperative  Mood Labile  Thought Process  Coherency Circumstantial  Content Blaming others  Delusions None reported or observed  Perception Hallucinations  Hallucination Auditory  Judgment Poor  Confusion None  Danger to Self  Current suicidal ideation? Passive  Self-Injurious Behavior No self-injurious ideation or behavior indicators observed or expressed   Agreement Not to Harm Self Yes  Description of Agreement verbal  Danger to Others  Danger to Others None reported or observed   Patient pleasant and cooperative. Denies SI, HI,AVH. Med compliant. Appropriate with staff and peers. Visible in milieu, watching tv. Encouragement and support provided. Safety checks maintained. Declines sleep med. Remains safe on unit with q 15 min checks.

## 2023-12-08 NOTE — Group Note (Signed)
 Recreation Therapy Group Note   Group Topic:Coping Skills  Group Date: 12/08/2023 Start Time: 1000 End Time: 1050 Facilitators: Deatrice Factor, LRT, CTRS Location:  Craft Room  Group Description: Mind Map.  Patient was provided a blank template of a diagram with 32 blank boxes in a tiered system, branching from the center (similar to a bubble chart). LRT directed patients to label the middle of the diagram "Coping Skills". LRT and patients then came up with 8 different coping skills as examples. Pt were directed to record their coping skills in the 2nd tier boxes closest to the center.  Patients would then share their coping skills with the group as LRT wrote them out. LRT gave a handout of 99 different coping skills at the end of group.   Goal Area(s) Addressed: Patients will be able to define "coping skills". Patient will identify new coping skills.  Patient will increase communication.   Affect/Mood: Flat   Participation Level: Minimal    Clinical Observations/Individualized Feedback: George [name] was present in group. Pt said with her head down on the table duration of session. Pt woke to her name being called and said "video games" are a coping skill.   Plan: Continue to engage patient in RT group sessions 2-3x/week.   Deatrice Factor, LRT, CTRS 12/08/2023 1:18 PM

## 2023-12-08 NOTE — Group Note (Signed)
 LCSW Group Therapy Note   Group Date: 12/08/2023 Start Time: 1300 End Time: 1400   Type of Therapy and Topic:  Group Therapy: Challenging Core Beliefs  Participation Level:  None  Description of Group:  Patients were educated about core beliefs and asked to identify one harmful core belief that they have. Patients were asked to explore from where those beliefs originate. Patients were asked to discuss how those beliefs make them feel and the resulting behaviors of those beliefs. They were then be asked if those beliefs are true and, if so, what evidence they have to support them. Lastly, group members were challenged to replace those negative core beliefs with helpful beliefs.   Therapeutic Goals:   1. Patient will identify harmful core beliefs and explore the origins of such beliefs. 2. Patient will identify feelings and behaviors that result from those core beliefs. 3. Patient will discuss whether such beliefs are true. 4.  Patient will replace harmful core beliefs with helpful ones.  Summary of Patient Progress:  Patient slept during group and was not actively engaged in processing and exploring how core beliefs are formed and how they impact thoughts, feelings, and behaviors. Patient proved open to input from peers and feedback from CSW. Patient demonstrated no insight into the subject matter, was respectful and supportive of peers, and participated throughout the entire session.  Therapeutic Modalities: Cognitive Behavioral Therapy; Solution-Focused Therapy   George [name] M George [name], LCSWA 12/08/2023  2:01 PM

## 2023-12-08 NOTE — Plan of Care (Signed)
  Problem: Education: Goal: Mental status will improve Outcome: Progressing Goal: Verbalization of understanding the information provided will improve Outcome: Progressing   Problem: Activity: Goal: Interest or engagement in activities will improve Outcome: Progressing   Problem: Coping: Goal: Ability to verbalize frustrations and anger appropriately will improve Outcome: Progressing Goal: Ability to demonstrate self-control will improve Outcome: Progressing   Problem: Safety: Goal: Periods of time without injury will increase Outcome: Progressing

## 2023-12-08 NOTE — Plan of Care (Signed)
  Problem: Education: Goal: Mental status will improve Outcome: Progressing   Problem: Activity: Goal: Interest or engagement in activities will improve Outcome: Progressing Goal: Sleeping patterns will improve Outcome: Progressing

## 2023-12-09 DIAGNOSIS — F332 Major depressive disorder, recurrent severe without psychotic features: Secondary | ICD-10-CM | POA: Diagnosis not present

## 2023-12-09 MED ORDER — FLUOXETINE HCL 20 MG PO CAPS
40.0000 mg | ORAL_CAPSULE | Freq: Every day | ORAL | Status: DC
  Administered 2023-12-10 – 2023-12-12 (×3): 40 mg via ORAL
  Filled 2023-12-09 (×3): qty 2

## 2023-12-09 NOTE — Plan of Care (Signed)
  Problem: Education: Goal: Knowledge of [facility] Education information/materials will improve Outcome: Not Progressing Goal: Emotional status will improve Outcome: Not Progressing Goal: Mental status will improve Outcome: Not Progressing   

## 2023-12-09 NOTE — Group Note (Signed)
 Date:  12/09/2023 Time:  9:08 PM  Group Topic/Focus:  Wellness Toolbox:   The focus of this group is to discuss various aspects of wellness, balancing those aspects and exploring ways to increase the ability to experience wellness.  Patients will create a wellness toolbox for use upon discharge.    Participation Level:  Active  Participation Quality:  Intrusive  Affect:  Defensive  Cognitive:  Alert  Insight: Lacking  Engagement in Group:  Distracting  Modes of Intervention:  Discussion  Additional Comments:     Fabiola Holy 12/09/2023, 9:08 PM

## 2023-12-09 NOTE — Progress Notes (Signed)
 [facility] MD Progress Note  12/09/2023 5:05 PM George [name]  MRN:  [id number]  Patient is a 60 transgender male, born male and now identifies as a male and prefers pronouns as she/her/hers who is presenting to the ED for a rash and SI. Pt states she has been itching since last night in her right inner thigh and all over that is severe. Pt also states she has been having SI. Pt has not tried any Benadryl . Pt states she is having current issues that is making them have SI. Patient is admitted to adult psych unit with Q15 min safety monitoring. Multidisciplinary team approach is offered. Medication management; group/milieu therapy is offered.   Subjective:  Chart reviewed, case discussed in multidisciplinary meeting, patient seen during rounds.  Patient is noted to be talking to other peers in the day room.  She reports feeling fine.  She reports that her itching is not being controlled by Benadryl , hydroxyzine , corticosteroid cream etc. but she is able to zone it out of her mind.  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  She denies any active SI/HI/intent/plan and has not displayed any nonsuicidal self harming behaviors.  She is taking her Prozac  with no reported side effect and agree with the plan to increase Prozac  dosage to 40 mg.  Provider and patient discussed about discharge planning on Monday and she agreed to the plan.   Sleep: Fair  Appetite:  Fair  Past Psychiatric History: see h&P Family History: History reviewed. No pertinent family history. Social History:  Social History   Substance and Sexual Activity  Alcohol Use No     Social History   Substance and Sexual Activity  Drug Use Never    Social History   Socioeconomic History   Marital status: Single    Spouse name: Not on file   Number of children: Not on file   Years of education: Not on file   Highest education level: Not  on file  Occupational History   Not on file  Tobacco Use   Smoking status: Never   Smokeless tobacco: Never  Vaping Use   Vaping status: Never Used  Substance and Sexual Activity   Alcohol use: No   Drug use: Never   Sexual activity: Yes  Other Topics Concern   Not on file  Social History Narrative   Not on file   Social Drivers of Health   Financial Resource Strain: Not on file  Food Insecurity: Food Insecurity Present (12/06/2023)   Hunger Vital Sign    Worried About Running Out of Food in the Last Year: Often true    Ran Out of Food in the Last Year: Often true  Transportation Needs: Unmet Transportation Needs (12/06/2023)   PRAPARE - Administrator, Civil Service (Medical): Yes    Lack of Transportation (Non-Medical): Yes  Physical Activity: Not on file  Stress: Not on file  Social Connections: Not on file   Past Medical History:  Past Medical History:  Diagnosis Date   ADHD    Anxiety    Autism spectrum    Patient self reported   Depression    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[facility])    Patient self reported   Gender dysphoria    PTSD (post-traumatic stress disorder)     Past Surgical History:  Procedure Laterality Date   FACIAL FRACTURE SURGERY      Current Medications: Current  Facility-Administered Medications  Medication Dose Route Frequency Provider Last Rate Last Admin   acetaminophen  (TYLENOL ) tablet 650 mg  650 mg Oral Q6H PRN Gilman Lade, NP       alum & mag hydroxide-simeth (MAALOX/MYLANTA) 200-200-20 MG/5ML suspension 30 mL  30 mL Oral Q4H PRN Lorrene Rosser, Veronique M, NP       diphenhydrAMINE  (BENADRYL ) capsule 50 mg  50 mg Oral Q6H PRN Lorrene Rosser, Veronique M, NP   50 mg at 12/06/23 2014   [START ON 12/10/2023] FLUoxetine  (PROZAC ) capsule 40 mg  40 mg Oral Daily Sasan Wilkie, MD       hydrocortisone  cream 1 %   Topical QID Byungura, Veronique M, NP   1 Application at 12/08/23 1237   hydrOXYzine  (ATARAX ) tablet 25 mg  25 mg Oral  TID Yelena Metzer, MD   25 mg at 12/09/23 1237   magnesium  hydroxide (MILK OF MAGNESIA) suspension 30 mL  30 mL Oral Daily PRN Gilman Lade, NP       traZODone  (DESYREL ) tablet 50 mg  50 mg Oral QHS PRN Gilman Lade, NP        Lab Results: No results found for this or any previous visit (from the past 48 hours).  Blood Alcohol level:  Lab Results  Component Value Date   ETH <10 12/05/2023   ETH <10 05/07/2023    Metabolic Disorder Labs: Lab Results  Component Value Date   HGBA1C 5.1 05/10/2023   MPG 100 05/10/2023   No results found for: "PROLACTIN" Lab Results  Component Value Date   CHOL 193 05/10/2023   TRIG 77 05/10/2023   HDL 44 05/10/2023   CHOLHDL 4.4 05/10/2023   VLDL 15 05/10/2023   LDLCALC 134 (H) 05/10/2023    Physical Findings: AIMS:  , ,  ,  ,    CIWA:    COWS:      Psychiatric Specialty Exam:  Presentation  General Appearance:  Appropriate for Environment; Casual  Eye Contact: Fair  Speech: Clear and Coherent  Speech Volume: Normal    Mood and Affect  Mood: Euthymic  Affect: Appropriate   Thought Process  Thought Processes: Coherent  Descriptions of Associations:Intact  Orientation:Full (Time, Place and Person)  Thought Content:Logical  Hallucinations:Hallucinations: None  Ideas of Reference:None  Suicidal Thoughts:Suicidal Thoughts: No  Homicidal Thoughts:Homicidal Thoughts: No   Sensorium  Memory: Immediate Fair; Recent Fair; Remote Fair  Judgment: Impaired  Insight: Shallow   Executive Functions  Concentration: Fair  Attention Span: Fair  Recall: [employer] of Knowledge: Fair  Language: Fair   Psychomotor Activity  Psychomotor Activity: Psychomotor Activity: Normal  Musculoskeletal: Strength & Muscle Tone: within normal limits Gait & Station: normal Assets  Assets: Manufacturing systems engineer; Desire for Improvement; Resilience; Physical Health    Physical  Exam: Physical Exam Vitals and nursing note reviewed.  HENT:     Head: Normocephalic.     Right Ear: Tympanic membrane normal.     Nose: Nose normal.     Mouth/Throat:     Mouth: Mucous membranes are moist.  Cardiovascular:     Rate and Rhythm: Normal rate.     Pulses: Normal pulses.  Pulmonary:     Breath sounds: Normal breath sounds.  Abdominal:     General: Bowel sounds are normal.  Musculoskeletal:        General: Normal range of motion.  Skin:    General: Skin is warm.  Neurological:     General: No focal deficit present.  Mental Status: She is alert.    Review of Systems  Constitutional: Negative.   HENT: Negative.    Eyes: Negative.   Cardiovascular: Negative.   Skin: Negative.   Neurological: Negative.    Blood pressure 103/63, pulse 61, temperature (!) 97.2 F (36.2 C), resp. rate 16, height 5\' 2"  (1.575 m), weight 53.3 kg, SpO2 100%. Body mass index is 21.49 kg/m.  Diagnosis: Principal Problem:   MDD (major depressive disorder), recurrent episode, severe (HCC)  Clinical Decision Making: Patient with depression, anxiety with borderline personality traits presented to the ED with chronic SI exacerbated by itchiness going on for last 3 days.  Patient is impulsive, irritable and disrespectful demanding treatment for itchiness and declines all other psychotropic medications stating she is allergic to SSRIs, antipsychotics, mood stabilizers.  Patient is displaying safe behaviors on the unit.  Will continue to monitor the need for inpatient psychiatric hospitalization.   Treatment Plan Summary:   Safety and Monitoring:             -- Voluntary admission to inpatient psychiatric unit for safety, stabilization and treatment             -- Daily contact with patient to assess and evaluate symptoms and progress in treatment             -- Patient's case to be discussed in multi-disciplinary team meeting             -- Observation Level: q15 minute checks              -- Vital signs:  q12 hours             -- Precautions: suicide, elopement, and assault   2. Psychiatric Diagnoses and Treatment:               Continue home medication Prozac  - dose increased to 40 mg Patient is reportedly allergic to other mood stabilizers like Depakote, Tegretol, like Lamictal and antipsychotics as mood stabilizers like Abilify, Risperdal , Seroquel etc.   -- The risks/benefits/side-effects/alternatives to this medication were discussed in detail with the patient and time was given for questions. The patient consents to medication trial.                -- Metabolic profile and EKG monitoring obtained while on an atypical antipsychotic (BMI: Lipid Panel: HbgA1c: QTc:)              -- Encouraged patient to participate in unit milieu and in scheduled group therapies                            3. Medical Issues Being Addressed:      4. Discharge Planning:              -- Social work and case management to assist with discharge planning and identification of hospital follow-up needs prior to discharge             -- Estimated LOS: 1 to 2 days             -- Discharge Concerns: Need to establish a safety plan; Medication compliance and effectiveness             -- Discharge Goals: Return home with outpatient referrals follow ups   Physician Treatment Plan for Primary Diagnosis: MDD (major depressive disorder), recurrent episode, severe (HCC) Long Term Goal(s): Improvement in symptoms so as ready for discharge  Short Term Goals: Ability to identify changes in lifestyle to reduce recurrence of condition will improve, Ability to verbalize feelings will improve, Ability to disclose and discuss suicidal ideas, Ability to demonstrate self-control will improve, and Ability to identify and develop effective coping behaviors will improve   Physician Treatment Plan for Secondary Diagnosis: Principal Problem:   MDD (major depressive disorder), recurrent episode, severe (HCC)   Long  Term Goal(s): Improvement in symptoms so as ready for discharge   Short Term Goals: Ability to identify changes in lifestyle to reduce recurrence of condition will improve, Ability to verbalize feelings will improve, Ability to disclose and discuss suicidal ideas, Ability to demonstrate self-control will improve, Ability to identify and develop effective coping behaviors will improve, Ability to maintain clinical measurements within normal limits will improve, and Compliance with prescribed medications will improve  Aurelia Blotter, MD 12/09/2023, 5:05 PM

## 2023-12-09 NOTE — Group Note (Signed)
 Recreation Therapy Group Note   Group Topic:Other  Group Date: 12/09/2023 Start Time: 1005 End Time: 1050 Facilitators: Celestia Jeoffrey BRAVO, LRT, CTRS Location:  Craft Room  Activity Description/Intervention: Therapeutic Drumming. Patients with peers and staff were given the opportunity to engage in a leader facilitated [facility] Group Empowerment Drumming Circle with staff from the [employer], in partnership with [employer]. Teaching laboratory technician and trained [place], Norleen Mon leading with LRT observing and documenting intervention and pt response. This evidenced-based practice targets 7 areas of health and wellbeing in the human experience including: stress-reduction, exercise, self-expression, camaraderie/support, nurturing, spirituality, and music-making (leisure).    Goal Area(s) Addresses:  Patient will engage in pro-social way in music group.  Patient will follow directions of drum leader on the first prompt. Patient will demonstrate no behavioral issues during group.  Patient will identify if a reduction in stress level occurs as a result of participation in therapeutic drum circle.     Affect/Mood: Appropriate   Participation Level: Active and Engaged   Participation Quality: Independent   Behavior: Alert   Speech/Thought Process: Coherent   Insight: Good   Judgement: Fair    Modes of Intervention: Music   Patient Response to Interventions:  Attentive, Engaged, and Receptive   Education Outcome:  Acknowledges education   Clinical Observations/Individualized Feedback: [name] was active in their participation of session activities and group discussion.    Plan: Continue to engage patient in RT group sessions 2-3x/week.   Jeoffrey BRAVO Celestia, LRT, CTRS 12/09/2023 1:40 PM

## 2023-12-09 NOTE — Progress Notes (Signed)
   12/09/23 1000  Psych Admission Type (Psych Patients Only)  Admission Status Voluntary  Psychosocial Assessment  Patient Complaints Anxiety;Depression;Self-harm thoughts (patient states if the itchiness goes away, the thoughts will go away; patient also states that she always has depression, it's part of my PTSD, it comes and goes. Patient states that her anxiety is ok.)  Eye Contact Fair  Facial Expression Animated  Affect Preoccupied  Speech Logical/coherent  Interaction Assertive  Motor Activity Slow  Appearance/Hygiene In scrubs;Unremarkable  Behavior Characteristics Cooperative;Appropriate to situation  Mood Pleasant  Aggressive Behavior  Effect No apparent injury  Thought Process  Coherency WDL  Content Blaming others (patient blaming the providers, stating the medication isn't working.)  Delusions None reported or observed  Perception Hallucinations  Hallucination Auditory;Visual (unsure of which hallucinations, patient states other than my DID.)  Judgment Poor  Confusion None  Danger to Self  Current suicidal ideation? Passive  Self-Injurious Behavior No self-injurious ideation or behavior indicators observed or expressed   Agreement Not to Harm Self Yes  Description of Agreement Verbal  Danger to Others  Danger to Others None reported or observed

## 2023-12-10 DIAGNOSIS — F332 Major depressive disorder, recurrent severe without psychotic features: Secondary | ICD-10-CM

## 2023-12-10 NOTE — Progress Notes (Signed)
 [facility] MD Progress Note  12/10/2023 1:21 PM George [name]  MRN:  [id number]  Subjective:  " I won't be suicidal if I can stop itching."  Evaluation:  George [name] 28 year old person was admitted due to suicidal ideations due to excessive itchiness. "George [name]" continues to endorse passive ideation related to unresolved itchiness.  States Benadryl  and hydroxyzine  does not help with symptoms.  Currently prescribed Prozac  and Atarax  and trazodone  for mood stabilization.  Slightly condescending throughout this assessment. "  I would be better if I can get some real help around here" presents irritable and is minimal throughout assessment.  Patient was asked about depression symptoms " yes" short abrupt responses.  George [name] would not elaborate on stressors.  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  George [name] denied that "she" has been followed by therapy or psychiatry in the past.   Chart review patient has been interacting with peers throughout the day room. George [name] has attended group sessions, no documented concerns related to sleep.  Staff to continue to monitor for safety.  Support,  encouragement and  reassurance was provided.  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 No pertinent family history. Family Psychiatric  History:  Social History:  Social History   Substance and Sexual Activity  Alcohol Use No      Social History   Substance and Sexual Activity  Drug Use Never    Social History   Socioeconomic History   Marital status: Single    Spouse name: Not on file   Number of children: Not on file   Years of education: Not on file   Highest education level: Not on file  Occupational History   Not on file  Tobacco Use   Smoking status: Never   Smokeless tobacco: Never  Vaping Use   Vaping status: Never Used  Substance and Sexual Activity   Alcohol use: No   Drug use: Never   Sexual activity: Yes  Other Topics Concern   Not on file  Social History Narrative   Not on file   Social Drivers of Health   Financial Resource Strain: Not on file  Food Insecurity: Food Insecurity Present (12/06/2023)   Hunger Vital Sign    Worried About Running Out of Food in the Last Year: Often true    Ran Out of Food in the Last Year: Often true  Transportation Needs: Unmet Transportation Needs (12/06/2023)   PRAPARE - Administrator, Civil Service (Medical): Yes    Lack of Transportation (Non-Medical): Yes  Physical Activity: Not on file  Stress: Not on file  Social Connections: Not on file   Additional Social History:                         Sleep: Fair  Appetite:  Good  Current Medications: Current Facility-Administered Medications  Medication Dose Route Frequency Provider Last Rate Last Admin   acetaminophen  (TYLENOL ) tablet 650 mg  650 mg Oral Q6H PRN Gilman Lade, NP       alum & mag hydroxide-simeth (MAALOX/MYLANTA) 200-200-20 MG/5ML suspension 30 mL  30 mL Oral Q4H PRN Lorrene Rosser, Veronique M, NP       diphenhydrAMINE  (BENADRYL ) capsule 50 mg  50 mg Oral Q6H PRN Lorrene Rosser, Veronique M, NP   50 mg at 12/06/23 2014   FLUoxetine  (PROZAC ) capsule 40 mg  40 mg Oral Daily Jadapalle, Sree, MD   40 mg at 12/10/23 0981   hydrocortisone  cream 1 %   Topical QID Gilman Lade, NP   1 Application at 12/08/23 1237   hydrOXYzine  (ATARAX ) tablet 25 mg  25  mg Oral TID Jadapalle, Sree, MD   25 mg at 12/10/23 1239   magnesium  hydroxide (MILK OF MAGNESIA) suspension 30 mL  30 mL Oral Daily PRN Gilman Lade, NP       traZODone  (DESYREL ) tablet 50 mg  50 mg Oral QHS PRN Gilman Lade, NP        Lab Results: No results found for this or any previous visit (from the past 48 hours).  Blood Alcohol level:  Lab Results  Component Value Date   ETH <10 12/05/2023   ETH <10 05/07/2023    Metabolic Disorder Labs: Lab Results  Component Value Date   HGBA1C 5.1 05/10/2023   MPG 100 05/10/2023   No results found for: "PROLACTIN" Lab Results  Component Value Date   CHOL 193 05/10/2023   TRIG 77 05/10/2023   HDL 44 05/10/2023   CHOLHDL 4.4 05/10/2023   VLDL 15 05/10/2023   LDLCALC 134 (H) 05/10/2023    Physical Findings: AIMS:  , ,  ,  ,    CIWA:    COWS:     Musculoskeletal: Strength & Muscle Tone: within normal limits Gait & Station: normal Patient leans: N/A  Psychiatric Specialty Exam:  Presentation  General Appearance:  Appropriate for Environment  Eye Contact: Good  Speech: Clear and Coherent  Speech Volume: Normal  Handedness: Left   Mood and Affect  Mood: Depressed  Affect: Congruent   Thought Process  Thought Processes: Coherent  Descriptions of Associations:Intact  Orientation:Full (Time, Place and Person)  Thought Content:Logical  History of Schizophrenia/Schizoaffective disorder:No  Duration of Psychotic Symptoms:No data recorded Hallucinations:Hallucinations: None  Ideas of Reference:None  Suicidal Thoughts:Suicidal Thoughts: No SI Passive Intent and/or Plan: Without Intent  Homicidal Thoughts:Homicidal Thoughts: No   Sensorium  Memory: Immediate Fair  Judgment: Good  Insight: Shallow   Executive Functions  Concentration: Poor  Attention Span: Good  Recall: Good  Fund of Knowledge: Fair  Language: Fair   Psychomotor Activity  Psychomotor  Activity: Psychomotor Activity: Normal   Assets  Assets: Desire for Improvement; Financial Resources/Insurance   Sleep  Sleep: Sleep: Fair    Physical Exam: Physical Exam Vitals and nursing note reviewed.  Neurological:     Mental Status: She is alert.  Psychiatric:        Mood and Affect: Mood normal.        Thought Content: Thought content normal.    Review of Systems  Psychiatric/Behavioral:  Positive for depression and suicidal ideas. The patient is nervous/anxious.   All other systems reviewed and are negative.  Blood pressure (!) 90/53, pulse 68, temperature (!) 97.2 F (36.2 C), resp. rate 15, height 5\' 2"  (1.575 m), weight 53.3 kg, SpO2 98%. Body  mass index is 21.49 kg/m.   Treatment Plan Summary: Daily contact with patient to assess and evaluate symptoms and progress in treatment and Medication management  Continue with current treatment plan on 12/10/2023 as listed below except were noted  Continue Prozac  20 mg daily Continue hydroxyzine  25 to 50 mg p.o. nightly as needed Continue trazodone  50 mg p.o. nightly  CSW to continue working on discharge disposition Patient encouraged to participate in therapeutic milieu  Levester Reagin, NP 12/10/2023, 1:21 PM

## 2023-12-10 NOTE — Plan of Care (Signed)
  Problem: Education: Goal: Emotional status will improve Outcome: Progressing Goal: Mental status will improve Outcome: Progressing Goal: Verbalization of understanding the information provided will improve Outcome: Progressing   Problem: Activity: Goal: Interest or engagement in activities will improve Outcome: Progressing   Problem: Safety: Goal: Periods of time without injury will increase Outcome: Progressing

## 2023-12-10 NOTE — Progress Notes (Signed)
   12/09/23 2000  Psych Admission Type (Psych Patients Only)  Admission Status Voluntary  Psychosocial Assessment  Patient Complaints Anxiety  Eye Contact Fair  Facial Expression Animated  Affect Apprehensive;Anxious  Speech Logical/coherent  Interaction Assertive  Motor Activity Slow  Appearance/Hygiene In scrubs;Improved  Behavior Characteristics Cooperative;Appropriate to situation  Mood Pleasant  Thought Process  Coherency WDL  Content Blaming others  Delusions None reported or observed  Perception Hallucinations  Hallucination None reported or observed  Judgment Poor  Confusion None  Danger to Self  Current suicidal ideation? Denies   Patient alert and oriented x 4, she denies SI/HI/AVH interacting appropriately with peers and staff, thoughts are organized will continue to monitor

## 2023-12-10 NOTE — Progress Notes (Addendum)
 Pt present A&O x4, she reports having passive SI but, no plan or intent. Pt is compliant with taking her medications and attending groups. Pt is managed on q 15 min rounds.     12/10/23 1000  Psych Admission Type (Psych Patients Only)  Admission Status Voluntary  Psychosocial Assessment  Patient Complaints Anxiety  Eye Contact Fair  Facial Expression Animated  Affect Blunted;Labile  Speech Logical/coherent  Interaction Assertive  Motor Activity Restless  Appearance/Hygiene In scrubs  Behavior Characteristics Cooperative  Mood Pleasant  Thought Process  Coherency WDL  Content Preoccupation  Delusions None reported or observed  Perception Hallucinations  Hallucination None reported or observed  Judgment Poor  Confusion None  Danger to Self  Current suicidal ideation? Denies  Self-Injurious Behavior No self-injurious ideation or behavior indicators observed or expressed   Agreement Not to Harm Self Yes  Description of Agreement Verbal  Danger to Others  Danger to Others None reported or observed

## 2023-12-10 NOTE — Group Note (Signed)
 Date:  12/10/2023 Time:  9:34 PM  Group Topic/Focus:  Wrap-Up Group:   The focus of this group is to help patients review their daily goal of treatment and discuss progress on daily workbooks.    Participation Level:  Active  Participation Quality:  Appropriate and Attentive  Affect:  Appropriate  Cognitive:  Alert and Appropriate  Insight: Appropriate  Engagement in Group:  Engaged  Modes of Intervention:  Discussion and Orientation  Additional Comments:     [name],George Bartolome E 12/10/2023, 9:34 PM

## 2023-12-11 NOTE — Plan of Care (Addendum)
   Problem: Education: Goal: Knowledge of [facility] Education information/materials will improve Outcome: Progressing Goal: Emotional status will improve Outcome: Progressing Goal: Mental status will improve Outcome: Progressing Goal: Verbalization of understanding the information provided will improve Outcome: Progressing

## 2023-12-11 NOTE — Progress Notes (Signed)
 [facility] MD Progress Note  12/11/2023 9:46 AM George [name]  MRN:  [id number]  Subjective:  " I'm ok, but that is subject to change."  Evaluation:  George [name] 28 year old person was admitted due to suicidal ideations due to excessive itchiness. [name] denies current suicidal and homicidal ideation, but states that is also subject to change.  When asked what they thought would cause a change, patient states in a sarcastic tone "I don't know. You tell me."  Currently prescribed Prozac  and Atarax  and trazodone  for mood stabilization.  Patient is irritable throughout assessment.  Patient was asked about depression symptoms and responded only with "yes" refusing to elaborate.  Patient denies hallucinations and then says "except for my dissociate identity disorder stuff."  When asked to elaborate, patient refuses to answer and says again in a sarcastic tone "go read the chart."   Chart review patient has been interacting with peers throughout the day room. [name] has attended group sessions, no documented concerns related to sleep.  Staff to continue to monitor for safety.  Support, encouragement and reassurance was provided.  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 No pertinent family history. Family Psychiatric  History:  Social History:  Social History   Substance and Sexual Activity  Alcohol Use No      Social History   Substance and Sexual Activity  Drug Use Never    Social History   Socioeconomic History   Marital status: Single    Spouse name: Not on file   Number of children: Not on file   Years of education: Not on file   Highest education level: Not on file  Occupational History   Not on file  Tobacco Use   Smoking status: Never   Smokeless tobacco: Never  Vaping Use   Vaping status: Never Used  Substance and Sexual Activity   Alcohol use: No   Drug use: Never   Sexual activity: Yes  Other Topics Concern   Not on file  Social History Narrative   Not on file   Social Drivers of Health   Financial Resource Strain: Not on file  Food Insecurity: Food Insecurity Present (12/06/2023)   Hunger Vital Sign    Worried About Running Out of Food in the Last Year: Often true    Ran Out of Food in the Last Year: Often true  Transportation Needs: Unmet Transportation Needs (12/06/2023)   PRAPARE - Administrator, Civil Service (Medical): Yes    Lack of Transportation (Non-Medical): Yes  Physical Activity: Not on file  Stress: Not on file  Social Connections: Not on file   Additional Social History:                         Sleep: Fair  Appetite:  Good  Current Medications: Current Facility-Administered Medications  Medication Dose Route Frequency Provider Last Rate Last Admin   acetaminophen  (TYLENOL ) tablet 650 mg  650 mg Oral Q6H PRN Gilman Lade, NP       alum & mag hydroxide-simeth (MAALOX/MYLANTA) 200-200-20 MG/5ML suspension 30 mL  30 mL Oral Q4H PRN Lorrene Rosser, Veronique M, NP       diphenhydrAMINE  (BENADRYL ) capsule 50 mg  50 mg Oral Q6H PRN Lorrene Rosser, Veronique M, NP   50 mg at 12/06/23 2014   FLUoxetine  (PROZAC ) capsule 40 mg  40 mg Oral Daily Jadapalle, Sree, MD   40 mg at 12/11/23 2202   hydrocortisone  cream 1 %   Topical QID Gilman Lade, NP   1 Application at 12/08/23 1237   hydrOXYzine  (ATARAX ) tablet 25 mg  25  mg Oral TID Jadapalle, Sree, MD   25 mg at 12/11/23 5427   magnesium  hydroxide (MILK OF MAGNESIA) suspension 30 mL  30 mL Oral Daily PRN Gilman Lade, NP       traZODone  (DESYREL ) tablet 50 mg  50 mg Oral QHS PRN Gilman Lade, NP        Lab Results: No results found for this or any previous visit (from the past 48 hours).  Blood Alcohol level:  Lab Results  Component Value Date   ETH <10 12/05/2023   ETH <10 05/07/2023    Metabolic Disorder Labs: Lab Results  Component Value Date   HGBA1C 5.1 05/10/2023   MPG 100 05/10/2023   No results found for: "PROLACTIN" Lab Results  Component Value Date   CHOL 193 05/10/2023   TRIG 77 05/10/2023   HDL 44 05/10/2023   CHOLHDL 4.4 05/10/2023   VLDL 15 05/10/2023   LDLCALC 134 (H) 05/10/2023     Musculoskeletal: Strength & Muscle Tone: within normal limits Gait & Station: normal Patient leans: N/A  Psychiatric Specialty Exam:  Presentation  General Appearance:  Appropriate for Environment  Eye Contact: Good  Speech: Clear and Coherent  Speech Volume: Normal  Handedness: Left   Mood and Affect  Mood: Depressed  Affect: Congruent   Thought Process  Thought Processes: Coherent  Descriptions of Associations:Intact  Orientation:Full (Time, Place and Person)  Thought Content:Logical  History of Schizophrenia/Schizoaffective disorder:No  Duration of Psychotic Symptoms:No data recorded Hallucinations:Hallucinations: None  Ideas of Reference:None  Suicidal Thoughts:SI Passive Intent and/or Plan: Without Intent  Homicidal Thoughts:Homicidal Thoughts: No   Sensorium  Memory: Immediate Fair  Judgment: Good  Insight: Shallow   Executive Functions  Concentration: Poor  Attention Span: Good  Recall: Good  Fund of Knowledge: Fair  Language: Fair   Psychomotor Activity  Psychomotor Activity: Psychomotor Activity: Normal   Assets  Assets: Desire for  Improvement; Financial Resources/Insurance   Sleep  Sleep: Sleep: Fair    Physical Exam: Physical Exam Vitals and nursing note reviewed.  Eyes:     Pupils: Pupils are equal, round, and reactive to light.  Pulmonary:     Effort: Pulmonary effort is normal.  Skin:    General: Skin is dry.  Neurological:     Mental Status: She is alert and oriented to person, place, and time.  Psychiatric:        Attention and Perception: Attention normal.        Mood and Affect: Affect is labile.        Speech: Speech normal.        Thought Content: Thought content normal.        Cognition and Memory: Cognition and memory normal.  Comments: Superficially cooperative; sarcastic tone    Review of Systems  Psychiatric/Behavioral:  Positive for depression and suicidal ideas. The patient is nervous/anxious.   All other systems reviewed and are negative.  Blood pressure 103/62, pulse 62, temperature 98.6 F (37 C), resp. rate 20, height 5\' 2"  (1.575 m), weight 53.3 kg, SpO2 99%. Body mass index is 21.49 kg/m.   Treatment Plan Summary: Daily contact with patient to assess and evaluate symptoms and progress in treatment and Medication management  Continue with current treatment plan on 12/11/2023 as listed below except were noted  Continue Prozac  20 mg daily Continue hydroxyzine  25 p.o. nightly as needed Continue trazodone  50 mg p.o. nightly  CSW to continue working on discharge disposition Patient encouraged to participate in therapeutic milieu  Jeraline Moment, NP 12/11/2023, 9:46 AM

## 2023-12-11 NOTE — Progress Notes (Signed)
 Patient endorsing thoughts of self harm, with no active plan. Rating depression and anxiety 9/10 on questionnaire. Patient refused hydrocortisone  cream due to it not working well for patient. Patient is frustrated and voices concern that "that medicine ain't doing shit for me, no one is listening". Patient is irritable regarding medications, but cooperative and interacting appropriately with others on the unit.

## 2023-12-11 NOTE — Plan of Care (Signed)
  Problem: Education: Goal: Mental status will improve Outcome: Progressing   Problem: Education: Goal: Emotional status will improve Outcome: Progressing

## 2023-12-11 NOTE — Group Note (Signed)
 Date:  12/11/2023 Time:  9:31 PM  Group Topic/Focus:  Wrap-Up Group:   The focus of this group is to help patients review their daily goal of treatment and discuss progress on daily workbooks.    Participation Level:  Active  Participation Quality:  Appropriate and Supportive  Affect:  Appropriate  Cognitive:  Alert  Insight: Good  Engagement in Group:  Monopolizing  Modes of Intervention:  Activity and Support  Additional Comments:     Maglione,Gad Aymond E 12/11/2023, 9:31 PM

## 2023-12-12 DIAGNOSIS — F332 Major depressive disorder, recurrent severe without psychotic features: Secondary | ICD-10-CM | POA: Diagnosis not present

## 2023-12-12 MED ORDER — HYDROXYZINE HCL 25 MG PO TABS: 25.0000 mg | ORAL_TABLET | Freq: Three times a day (TID) | ORAL | 0 refills | Status: DC

## 2023-12-12 MED ORDER — TRAZODONE HCL 50 MG PO TABS: 50.0000 mg | ORAL_TABLET | Freq: Every evening | ORAL | 0 refills | Status: AC | PRN

## 2023-12-12 MED ORDER — FLUOXETINE HCL 40 MG PO CAPS: 40.0000 mg | ORAL_CAPSULE | Freq: Every day | ORAL | 0 refills | Status: DC

## 2023-12-12 NOTE — Plan of Care (Signed)
  Problem: Education: Goal: Emotional status will improve Outcome: Progressing Goal: Mental status will improve Outcome: Progressing   Problem: Education: Goal: Mental status will improve Outcome: Progressing   Problem: Activity: Goal: Interest or engagement in activities will improve Outcome: Progressing

## 2023-12-12 NOTE — Group Note (Signed)
 Recreation Therapy Group Note   Group Topic:Healthy Support Systems  Group Date: 12/12/2023 Start Time: 1035 End Time: 1130 Facilitators: Yvonna Herder, CTRS Location: Craft Room  Group Description: Straw Bridge. In groups or individually, patients were given 10 plastic drinking straws and an equal length of masking tape. Using the materials provided, patients were instructed to build a free-standing bridge-like structure to suspend an everyday item (ex: deck of cards) off the floor or table surface. All materials were required to be used in Secondary school teacher. LRT facilitated post-activity discussion reviewing the importance of having strong and healthy support systems in our lives. LRT discussed how the people in our lives serve as the tape and the deck of cards we placed on top of our straw structure are the stressors we face in daily life. LRT and pts discussed what happens in our life when things get too heavy for us , and we don't have strong supports outside of the hospital. Pt shared 2 of their healthy supports in their life aloud in the group.   Goal Area(s) Addressed:  Patient will identify 2 healthy supports in their life. Patient will identify skills to successfully complete activity. Patient will identify correlation of this activity to life post-discharge.  Patient will build on frustration tolerance skills. Patient will increase team building and communication skills.    Affect/Mood: Appropriate   Participation Level: Active and Engaged   Participation Quality: Independent   Behavior: Appropriate, Calm, and Cooperative   Speech/Thought Process: Coherent   Insight: Good   Judgement: Good   Modes of Intervention: Education, Exploration, Group work, and Support   Patient Response to Interventions:  Attentive, Engaged, Interested , and Receptive   Education Outcome:  Acknowledges education   Clinical Observations/Individualized Feedback: [name] was active in their  participation of session activities and group discussion. Pt identified "music, my mom, friend, and animals" as healthy supports. Pt interacted well with LRT and peers duration of session.    Plan: Continue to engage patient in RT group sessions 2-3x/week.   Deatrice Factor, LRT, CTRS 12/12/2023 11:42 AM

## 2023-12-12 NOTE — BHH Suicide Risk Assessment (Signed)
 [facility] Discharge Suicide Risk Assessment   Principal Problem: MDD (major depressive disorder), recurrent episode, severe (HCC) Discharge Diagnoses: Principal Problem:   MDD (major depressive disorder), recurrent episode, severe (HCC)   Total Time spent with patient: 30 minutes  Musculoskeletal: Strength & Muscle Tone: within normal limits Gait & Station: normal Patient leans: N/A  Psychiatric Specialty Exam  Presentation  General Appearance:  Appropriate for Environment  Eye Contact: Good  Speech: Clear and Coherent  Speech Volume: Normal  Handedness: Left   Mood and Affect  Mood: Depressed  Duration of Depression Symptoms: Greater than two weeks  Affect: Congruent   Thought Process  Thought Processes: Coherent  Descriptions of Associations:Intact  Orientation:Full (Time, Place and Person)  Thought Content:Logical  History of Schizophrenia/Schizoaffective disorder:No  Duration of Psychotic Symptoms:No data recorded Hallucinations:No data recorded Ideas of Reference:None  Suicidal Thoughts:No data recorded Homicidal Thoughts:No data recorded  Sensorium  Memory: Immediate Fair  Judgment: Good  Insight: Shallow   Executive Functions  Concentration: Poor  Attention Span: Good  Recall: Good  Fund of Knowledge: Fair  Language: Fair   Psychomotor Activity  Psychomotor Activity:No data recorded  Assets  Assets: Desire for Improvement; Financial Resources/Insurance   Sleep  Sleep:No data recorded  Physical Exam: Physical Exam ROS Blood pressure 97/61, pulse 63, temperature 97.9 F (36.6 C), resp. rate 18, height 5\' 2"  (1.575 m), weight 53.3 kg, SpO2 98%. Body mass index is 21.49 kg/m.  Mental Status Per Nursing Assessment::   On Admission:  Suicidal ideation indicated by patient, Plan includes specific time, place, or method, Self-harm thoughts (Patient reports a plan of running into traffic)  Demographic Factors:   Caucasian and Low socioeconomic status  Loss Factors: Decrease in vocational status  Historical Factors: Impulsivity  Risk Reduction Factors:   Positive therapeutic relationship and Positive coping skills or problem solving skills  Continued Clinical Symptoms:  Depression:   Impulsivity  Cognitive Features That Contribute To Risk:  None    Suicide Risk:  Minimal: No identifiable suicidal ideation.  Patients presenting with no risk factors but with morbid ruminations; may be classified as minimal risk based on the severity of the depressive symptoms   Follow-up Information     [facility], [facility]. Go to.   Why: In person appointment 12/14/23 at 11 AM. Contact information: [address] [place] [address] [phone]                 Plan Of Care/Follow-up recommendations:  Activity:  As tolerated  Aurelia Blotter, MD 12/12/2023, 8:56 AM

## 2023-12-12 NOTE — Progress Notes (Signed)
  [facility] Adult Case Management Discharge Plan :  Will you be returning to the same living situation after discharge:  No. At discharge, do you have transportation home?: Yes,  pt received taxi voucher.  Do you have the ability to pay for your medications: Yes,  Blue Cross Blue Shield.   Release of information consent forms completed and in the chart;  Patient's signature needed at discharge.  Patient to Follow up at:  Follow-up Information     L[facility], [facility]. Go to.   Why: In person appointment 12/14/23 at 11 AM. Contact information: [address] [place] [address] [phone]                 Next level of care provider has access to [facility] Link:no  Safety Planning and Suicide Prevention discussed: Yes,  SPE completed with mother, Hermilo Dutter.     Has patient been referred to the Quitline?: Patient does not use tobacco/nicotine products  Patient has been referred for addiction treatment: No known substance use disorder.  Randolm Butte, LCSW 12/12/2023, 10:56 AM

## 2023-12-12 NOTE — Progress Notes (Signed)
 Patient is discharging at this time. Patient is A&Ox4. Stable. Patient denies SI,HI, and A/V/H with no plan/intent. Printed AVS reviewed with and given to patient along with medications and follow up appointments. Suicide safety plan complete with copy provided to patient. Original form in assigned binder. Patient verbalized all understanding. All valuables/belongings returned to patient. Patient is being transported by taxi. Patient denies any pain/discomfort. No s/s of current distress.

## 2023-12-12 NOTE — Discharge Summary (Signed)
 Physician Discharge Summary Note  Patient:  George [name] is an 28 y.o., adult MRN:  [id number] DOB:  [birth year] Patient phone:  [phone] (home)  Patient address:   [address] [name] [address],    Date of Admission:  12/06/2023 Date of Discharge: 12/12/23  Reason for Admission:  Patient is a 58 transgender male, born male and now identifies as a male and prefers pronouns as she/her/hers who is presenting to the ED for a rash and SI. Pt states she has been itching since last night in her right inner thigh and all over that is severe. Pt also states she has been having SI. Pt has not tried any Benadryl . Pt states she is having current issues that is making them have SI. Patient is admitted to adult psych unit with Q15 min safety monitoring. Multidisciplinary team approach is offered. Medication management; group/milieu therapy is offered   Principal Problem: MDD (major depressive disorder), recurrent episode, severe (HCC) Discharge Diagnoses: Principal Problem:   MDD (major depressive disorder), recurrent episode, severe (HCC)   Past Psychiatric History: see h&p  Family Psychiatric  History: see h&p Social History:  Social History   Substance and Sexual Activity  Alcohol Use No     Social History   Substance and Sexual Activity  Drug Use Never    Social History   Socioeconomic History   Marital status: Single    Spouse name: Not on file   Number of children: Not on file   Years of education: Not on file   Highest education level: Not on file  Occupational History   Not on file  Tobacco Use   Smoking status: Never   Smokeless tobacco: Never  Vaping Use   Vaping status: Never Used  Substance and Sexual Activity   Alcohol use: No   Drug use: Never   Sexual activity: Yes  Other Topics Concern   Not on file  Social History Narrative   Not on file   Social Drivers of Health   Financial Resource Strain: Not on file  Food Insecurity: Food Insecurity  Present (12/06/2023)   Hunger Vital Sign    Worried About Running Out of Food in the Last Year: Often true    Ran Out of Food in the Last Year: Often true  Transportation Needs: Unmet Transportation Needs (12/06/2023)   PRAPARE - Administrator, Civil Service (Medical): Yes    Lack of Transportation (Non-Medical): Yes  Physical Activity: Not on file  Stress: Not on file  Social Connections: Not on file   Past Medical History:  Past Medical History:  Diagnosis Date   ADHD    Anxiety    Autism spectrum    Patient self reported   Depression    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[facility])    Patient self reported   Gender dysphoria    PTSD (post-traumatic stress disorder)     Past Surgical History:  Procedure Laterality Date   FACIAL FRACTURE SURGERY     Family History: History reviewed. No pertinent family history.  Hospital Course:  Patient is a 30 transgender male, born male and now identifies as a male and prefers pronouns as she/her/hers who is presenting to the ED for a rash and SI. Pt states she has been itching since last night in her right inner thigh and all over that is severe. Pt also states she has been having SI. Pt has not tried any Benadryl . Pt states she is having current issues that  is making them have SI. Patient is admitted to adult psych unit with Q15 min safety monitoring. Multidisciplinary team approach is offered. Medication management; group/milieu therapy is offered.  On admission patient was started back on her home medication Prozac  20 mg and gradually titrated up to 40 mg as patient reports having good response to Prozac  and due to lack of money she was off of the prescription.  She was started on hydroxyzine  25 mg 3 times daily to help with the itching and also anxiety throughout the day.  Patient tolerated her medications with no reported side effects or drowsiness.  She is able to acknowledge her chronic self harming thoughts "" DIDsymptoms".  She  consistently denied SI/HI/intent/plan and maintain safe behaviors with no self harming behaviors on the unit.  She attended all the groups and is noted to get along with peers.  On the day of discharge patient consistently denied SI/HI/intent/plan and is not responding to any internal stimuli.  Social work team has worked with her to find some shelters and gave her follow-up appointments for mental health services.  Patient had no concerns and remains future oriented to follow-up with outpatient mental health services.  Physical Findings: AIMS:  , ,  ,  ,    CIWA:    COWS:        Psychiatric Specialty Exam:  Presentation  General Appearance:  Appropriate for Environment; Casual  Eye Contact: Fair  Speech: Clear and Coherent  Speech Volume: Normal    Mood and Affect  Mood: Euthymic  Affect: Appropriate   Thought Process  Thought Processes: Coherent  Descriptions of Associations:Intact  Orientation:Full (Time, Place and Person)  Thought Content:Logical  Hallucinations:Hallucinations: None  Ideas of Reference:None  Suicidal Thoughts:Suicidal Thoughts: No  Homicidal Thoughts:Homicidal Thoughts: No   Sensorium  Memory: Immediate Fair; Recent Fair; Remote Fair  Judgment: Fair  Insight: Fair   Art therapist  Concentration: Fair  Attention Span: Fair  Recall: [employer] of Knowledge: Fair  Language: Fair   Psychomotor Activity  Psychomotor Activity: Psychomotor Activity: Normal  Musculoskeletal: Strength & Muscle Tone: within normal limits Gait & Station: normal Assets  Assets: Manufacturing systems engineer; Desire for Improvement; Physical Health   Sleep  Sleep: Sleep: Fair    Physical Exam:  Blood pressure 97/61, pulse 63, temperature 97.9 F (36.6 C), resp. rate 18, height 5\' 2"  (1.575 m), weight 53.3 kg, SpO2 98%. Body mass index is 21.49 kg/m.   Social History   Tobacco Use  Smoking Status Never  Smokeless  Tobacco Never   Tobacco Cessation:  N/A, patient does not currently use tobacco products   Blood Alcohol level:  Lab Results  Component Value Date   ETH <10 12/05/2023   ETH <10 05/07/2023    Metabolic Disorder Labs:  Lab Results  Component Value Date   HGBA1C 5.1 05/10/2023   MPG 100 05/10/2023   No results found for: "PROLACTIN" Lab Results  Component Value Date   CHOL 193 05/10/2023   TRIG 77 05/10/2023   HDL 44 05/10/2023   CHOLHDL 4.4 05/10/2023   VLDL 15 05/10/2023   LDLCALC 134 (H) 05/10/2023    See Psychiatric Specialty Exam and Suicide Risk Assessment completed by Attending Physician prior to discharge.  Discharge destination:  Other:  Shelter  Is patient on multiple antipsychotic therapies at discharge:  No   Has Patient had three or more failed trials of antipsychotic monotherapy by history:  No  Recommended Plan for Multiple Antipsychotic Therapies: NA  Allergies as of 12/12/2023       Reactions   Lithium Other (See Comments)   Liver Inflamation   Tegretol [carbamazepine] Other (See Comments)   Liver Inflamation        Medication List     TAKE these medications      Indication  emtricitabine -tenofovir  200-300 MG tablet Commonly known as: TRUVADA  Take 1 tablet by mouth daily.  Indication: HIV Infection Risk Reduction Before Potential Exposure   estradiol  2 MG tablet Commonly known as: ESTRACE  Take 1.5 mg by mouth 2 (two) times daily.  Indication: Transgender Woman   FLUoxetine  40 MG capsule Commonly known as: PROZAC  Take 1 capsule (40 mg total) by mouth daily. Start taking on: December 13, 2023 What changed:  medication strength how much to take  Indication: Depression   hydrOXYzine  25 MG tablet Commonly known as: ATARAX  Take 1 tablet (25 mg total) by mouth 3 (three) times daily.  Indication: Feeling Anxious   progesterone  100 MG capsule Commonly known as: PROMETRIUM  Take 100 mg by mouth at bedtime.  Indication: Hormonal  Therapy   spironolactone  100 MG tablet Commonly known as: ALDACTONE  Take 200 mg by mouth at bedtime.  Indication: Transgender Woman   traZODone  50 MG tablet Commonly known as: DESYREL  Take 1 tablet (50 mg total) by mouth at bedtime as needed for sleep.  Indication: Trouble Sleeping        Follow-up Information     [facility], [facility]. Go to.   Why: In person appointment 12/14/23 at 11 AM. Contact information: [address] [place] [address] [phone]                 Follow-up recommendations:  Activity:  As tolerated    Signed: Astou Lada, MD 12/12/2023, 8:58 AM

## 2023-12-12 NOTE — Progress Notes (Signed)
   12/11/23 2000  Psych Admission Type (Psych Patients Only)  Admission Status Voluntary  Psychosocial Assessment  Eye Contact Fair  Facial Expression Anxious;Animated  Affect Blunted  Speech Logical/coherent  Interaction Assertive  Motor Activity Slow  Appearance/Hygiene In scrubs  Behavior Characteristics Cooperative;Appropriate to situation  Mood Pleasant  Aggressive Behavior  Effect No apparent injury  Thought Process  Coherency WDL  Content WDL  Delusions None reported or observed  Perception WDL  Hallucination None reported or observed  Judgment Impaired  Confusion None  Danger to Self  Current suicidal ideation? Passive  Self-Injurious Behavior No self-injurious ideation or behavior indicators observed or expressed   Agreement Not to Harm Self Yes  Danger to Others  Danger to Others None reported or observed   Patient alert and oriented x 4, affect is blunted thoughts are organized and coherent, he denies SI/HI/AVH.

## 2024-06-01 ENCOUNTER — Emergency Department
Admission: EM | Admit: 2024-06-01 | Discharge: 2024-06-02 | Disposition: A | Discharge status: Other Acute Inpatient Hospital

## 2024-06-01 ENCOUNTER — Other Ambulatory Visit: Payer: Self-pay

## 2024-06-01 DIAGNOSIS — F840 Autistic disorder: Secondary | ICD-10-CM | POA: Diagnosis not present

## 2024-06-01 DIAGNOSIS — R45851 Suicidal ideations: Secondary | ICD-10-CM | POA: Insufficient documentation

## 2024-06-01 DIAGNOSIS — F332 Major depressive disorder, recurrent severe without psychotic features: Secondary | ICD-10-CM | POA: Diagnosis not present

## 2024-06-01 DIAGNOSIS — Z59819 Housing instability, housed unspecified: Secondary | ICD-10-CM | POA: Diagnosis not present

## 2024-06-01 DIAGNOSIS — R4689 Other symptoms and signs involving appearance and behavior: Secondary | ICD-10-CM | POA: Diagnosis present

## 2024-06-01 DIAGNOSIS — F603 Borderline personality disorder: Secondary | ICD-10-CM | POA: Diagnosis not present

## 2024-06-01 LAB — URINE DRUG SCREEN, QUALITATIVE (ARMC ONLY)
Amphetamines, Ur Screen: NOT DETECTED
Barbiturates, Ur Screen: NOT DETECTED
Benzodiazepine, Ur Scrn: NOT DETECTED
Cannabinoid 50 Ng, Ur ~~LOC~~: NOT DETECTED
Cocaine Metabolite,Ur ~~LOC~~: NOT DETECTED
MDMA (Ecstasy)Ur Screen: NOT DETECTED
Methadone Scn, Ur: NOT DETECTED
Opiate, Ur Screen: NOT DETECTED
Phencyclidine (PCP) Ur S: NOT DETECTED
Tricyclic, Ur Screen: NOT DETECTED

## 2024-06-01 LAB — COMPREHENSIVE METABOLIC PANEL WITH GFR
ALT: 10 U/L (ref 0–44)
AST: 17 U/L (ref 15–41)
Albumin: 4 g/dL (ref 3.5–5.0)
Alkaline Phosphatase: 58 U/L (ref 38–126)
Anion gap: 12 (ref 5–15)
BUN: 16 mg/dL (ref 6–20)
CO2: 22 mmol/L (ref 22–32)
Calcium: 8.5 mg/dL — ABNORMAL LOW (ref 8.9–10.3)
Chloride: 105 mmol/L (ref 98–111)
Creatinine, Ser: 0.73 mg/dL (ref 0.61–1.24)
GFR, Estimated: >60 mL/min (ref >60)
Glucose, Bld: 94 mg/dL (ref 70–99)
Potassium: 3.7 mmol/L (ref 3.5–5.1)
Sodium: 139 mmol/L (ref 135–145)
Total Bilirubin: 0.4 mg/dL (ref 0.0–1.2)
Total Protein: 6.6 g/dL (ref 6.5–8.1)

## 2024-06-01 LAB — CBC
HCT: 42.9 % (ref 39.0–52.0)
Hemoglobin: 15.2 g/dL (ref 13.0–17.0)
MCH: 30.7 pg (ref 26.0–34.0)
MCHC: 35.4 g/dL (ref 30.0–36.0)
MCV: 86.7 fL (ref 80.0–100.0)
Platelets: 284 K/uL (ref 150–400)
RBC: 4.95 MIL/uL (ref 4.22–5.81)
RDW: 13.2 % (ref 11.5–15.5)
WBC: 13.4 K/uL — ABNORMAL HIGH (ref 4.0–10.5)
nRBC: 0 % (ref 0.0–0.2)

## 2024-06-01 LAB — ETHANOL: Alcohol, Ethyl (B): <15 mg/dL (ref <15)
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
## 2024-06-01 NOTE — ED Triage Notes (Signed)
 Pt to ED via POV from home voluntary for SI. Pt reports SI w/ plan to stand on train track to get ran over. Pt reports just got of jail with no place to live, no income or job opportunities and feels stuck.

## 2024-06-01 NOTE — ED Notes (Signed)
VOL  pending  consult 

## 2024-06-01 NOTE — BH Assessment (Signed)
 Patient was deferred to [facility] for a telepsych assessment. The assigned care coordinator will provide updates regarding the scheduling of the assessment. IRIS coordinator can be reached at [phone] for further information on the timing of the telepsych evaluation.

## 2024-06-01 NOTE — ED Provider Notes (Signed)
 [facility] Provider Note    Event Date/Time   First MD Initiated Contact with Patient 06/01/24 1809     (approximate)   History   Suicidal   HPI  George [name] is a 28 y.o. 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  Patient tells me that she was discharged from jail yesterday and has no place to live.  She states that she she donated plasma today and afterwards felt as if she wanted to kill herself.  She has felt this on multiple occasions.  Her plan is to lay down and train tracks.  She does desire help and requests a psychiatry consult.  Has no physical complaints today      Physical Exam   Triage Vital Signs: ED Triage Vitals [06/01/24 1733]  Encounter Vitals Group     BP (!) 121/94     Girls Systolic BP Percentile      Girls Diastolic BP Percentile      Boys Systolic BP Percentile      Boys Diastolic BP Percentile      Pulse Rate 99     Resp 18     Temp 98.5 F (36.9 C)     Temp Source Oral     SpO2 98 %     Weight      Height      Head Circumference      Peak Flow      Pain Score 0     Pain Loc      Pain Education      Exclude from Growth Chart     Most recent vital signs: Vitals:   06/01/24 1733  BP: (!) 121/94  Pulse: 99  Resp: 18  Temp: 98.5 F (36.9 C)  SpO2: 98%    Nursing Triage Note reviewed. Vital signs reviewed and patients oxygen saturation is normoxic  General: Patient is well nourished, well developed, awake and alert, resting comfortably in no acute distress Head: Normocephalic and atraumatic Eyes: Normal inspection, extraocular muscles intact, no conjunctival pallor Ear, nose, throat: Normal external exam Neck: Normal range of motion Respiratory: Patient is in no respiratory distress, lungs CTAB Cardiovascular: Patient is not tachycardic, RRR without murmur appreciated GI: Abd SNT with  no guarding or rebound  Back: Normal inspection of the back with good strength and range of motion throughout all ext Extremities: pulses intact with good cap refills, no LE pitting edema or calf tenderness Neuro: The patient is alert and oriented to person, place, and time, appropriately conversive, with 5/5 bilat UE/LE strength, no gross motor or sensory defects noted. Coordination appears to be adequate. Skin: Warm, dry, and intact Psych: Depressed mood, + SI, no HI no AVH S  ED Results / Procedures / Treatments   Labs (all labs ordered are listed, but only abnormal results are displayed) Labs Reviewed  COMPREHENSIVE METABOLIC PANEL WITH GFR - Abnormal; Notable for the following components:      Result Value   Calcium 8.5 (*)    All other components within normal limits  CBC - Abnormal; Notable for the following components:   WBC 13.4 (*)    All other components within normal limits  ETHANOL  URINE DRUG SCREEN, QUALITATIVE ([facility] ONLY)     EKG   RADIOLOGY None    PROCEDURES:  Critical Care performed: No  Procedures   MEDICATIONS ORDERED IN ED: Medications - No data to display   IMPRESSION / MDM / ASSESSMENT AND PLAN / ED COURSE                                Differential diagnosis includes, but is not limited to, organic psychiatric disorder, malingering, substance use, electrolyte derangement anemia  ED course: Patient presents voluntarily for suicidal ideation and does desire help.  I do think she has capacity to make this decision.  She has no physical complaints and has no electrolyte derangements.  She does have a mild leukocytosis which may be reactive from donating plasma earlier today.  Urine drug screen is pending.  At this time she is clear for psych.   The patient has been placed in psychiatric observation due to the need to provide a safe environment for the patient while obtaining psychiatric consultation and evaluation, as well as ongoing medical  and medication management to treat the patient's condition.  The patient has not been placed under full IVC at this time.     -- Risk: 5 This patient has a high risk of morbidity due to further diagnostic testing or treatment. Rationale: This patient's evaluation and management involve a high risk of morbidity due to the potential severity of presenting symptoms, need for diagnostic testing, and/or initiation of treatment that may require close monitoring. The differential includes conditions with potential for significant deterioration or requiring escalation of care. Treatment decisions in the ED, including medication administration, procedural interventions, or disposition planning, reflect this level of risk. COPA: 5 The patient has the following acute or chronic illness/injury that poses a possible threat to life or bodily function: [X] : The patient has a potentially serious acute condition or an acute exacerbation of a chronic illness requiring urgent evaluation and management in the Emergency Department. The clinical presentation necessitates immediate consideration of life-threatening or function-threatening diagnoses, even if they are ultimately ruled out.   FINAL CLINICAL IMPRESSION(S) / ED DIAGNOSES   Final diagnoses:  Suicidal ideation  Housing instability     Rx / DC Orders   ED Discharge Orders     None        Note:  This document was prepared using Dragon voice recognition software and may include unintentional dictation errors.   Nicholaus Rolland BRAVO, MD 06/01/24 [phone]

## 2024-06-01 NOTE — ED Notes (Addendum)
 Pt dressed out by this RN and EDT:   1 black shirt  1 pair blue shorts  1 pair black boots 1 pair white socks  1 purple hair bow 1 purple tank top 1 black bra 1 lilo and stitch back pack (full of personal belongings - keys, headphones, phone, chargers)

## 2024-06-01 NOTE — ED Notes (Addendum)
#'  s   Doyce (ex girlfriend) [phone] Karna [phone] Alm [phone]

## 2024-06-01 NOTE — ED Notes (Signed)
 VOL

## 2024-06-01 NOTE — ED Notes (Signed)
 Snacks provided to pt

## 2024-06-02 ENCOUNTER — Encounter: Payer: Self-pay | Admitting: Psychiatry

## 2024-06-02 ENCOUNTER — Other Ambulatory Visit (HOSPITAL_COMMUNITY)
Admission: EM | Admit: 2024-06-02 | Discharge: 2024-06-05 | Disposition: A | Discharge status: Other Acute Inpatient Hospital | Source: Intra-hospital

## 2024-06-02 DIAGNOSIS — F603 Borderline personality disorder: Secondary | ICD-10-CM

## 2024-06-02 DIAGNOSIS — F32A Depression, unspecified: Secondary | ICD-10-CM | POA: Diagnosis present

## 2024-06-02 DIAGNOSIS — F411 Generalized anxiety disorder: Secondary | ICD-10-CM | POA: Insufficient documentation

## 2024-06-02 DIAGNOSIS — R45851 Suicidal ideations: Secondary | ICD-10-CM

## 2024-06-02 DIAGNOSIS — Z5900 Homelessness unspecified: Secondary | ICD-10-CM | POA: Insufficient documentation

## 2024-06-02 DIAGNOSIS — Z59819 Housing instability, housed unspecified: Secondary | ICD-10-CM | POA: Diagnosis not present

## 2024-06-02 DIAGNOSIS — F332 Major depressive disorder, recurrent severe without psychotic features: Secondary | ICD-10-CM | POA: Diagnosis not present

## 2024-06-02 DIAGNOSIS — R454 Irritability and anger: Secondary | ICD-10-CM | POA: Diagnosis not present

## 2024-06-02 DIAGNOSIS — Z79899 Other long term (current) drug therapy: Secondary | ICD-10-CM | POA: Insufficient documentation

## 2024-06-02 DIAGNOSIS — F329 Major depressive disorder, single episode, unspecified: Secondary | ICD-10-CM | POA: Diagnosis present

## 2024-06-02 MED ORDER — OLANZAPINE 10 MG IM SOLR: 5.0000 mg | Freq: Three times a day (TID) | INTRAMUSCULAR | Status: DC | PRN

## 2024-06-02 MED ORDER — HYDROXYZINE HCL 25 MG PO TABS
25.0000 mg | ORAL_TABLET | Freq: Three times a day (TID) | ORAL | Status: DC | PRN
Start: 2024-06-02 — End: 2024-06-05

## 2024-06-02 MED ORDER — TRAZODONE HCL 50 MG PO TABS: 50.0000 mg | ORAL_TABLET | Freq: Every evening | ORAL | Status: DC | PRN

## 2024-06-02 MED ORDER — OLANZAPINE 5 MG PO TBDP: 5.0000 mg | ORAL_TABLET | Freq: Three times a day (TID) | ORAL | Status: DC | PRN

## 2024-06-02 MED ORDER — OLANZAPINE 10 MG PO TBDP: 5.0000 mg | ORAL_TABLET | Freq: Two times a day (BID) | ORAL | Status: DC | PRN

## 2024-06-02 MED ORDER — NICOTINE 14 MG/24HR TD PT24
14.0000 mg | MEDICATED_PATCH | Freq: Every day | TRANSDERMAL | Status: DC
  Filled 2024-06-02 (×2): qty 1

## 2024-06-02 MED ORDER — OLANZAPINE 10 MG IM SOLR: 10.0000 mg | Freq: Three times a day (TID) | INTRAMUSCULAR | Status: DC | PRN

## 2024-06-02 MED ORDER — ESTRADIOL 0.5 MG PO TABS
2.0000 mg | ORAL_TABLET | Freq: Two times a day (BID) | ORAL | Status: DC
Start: 2024-06-02 — End: 2024-06-04
  Administered 2024-06-02 – 2024-06-04 (×4): 2 mg via ORAL
  Filled 2024-06-02 (×4): qty 4

## 2024-06-02 MED ORDER — PROGESTERONE MICRONIZED 100 MG PO CAPS
100.0000 mg | ORAL_CAPSULE | Freq: Every day | ORAL | Status: DC
Start: 2024-06-02 — End: 2024-06-05
  Administered 2024-06-02 – 2024-06-04 (×3): 100 mg via ORAL
  Filled 2024-06-02 (×3): qty 1

## 2024-06-02 MED ORDER — ALUM & MAG HYDROXIDE-SIMETH 200-200-20 MG/5ML PO SUSP: 30.0000 mL | ORAL | Status: DC | PRN

## 2024-06-02 MED ORDER — DIPHENHYDRAMINE HCL 25 MG PO CAPS: 25.0000 mg | ORAL_CAPSULE | Freq: Four times a day (QID) | ORAL | Status: DC | PRN

## 2024-06-02 MED ORDER — MAGNESIUM HYDROXIDE 400 MG/5ML PO SUSP: 30.0000 mL | Freq: Every day | ORAL | Status: DC | PRN

## 2024-06-02 MED ORDER — ACETAMINOPHEN 325 MG PO TABS: 650.0000 mg | ORAL_TABLET | Freq: Four times a day (QID) | ORAL | Status: DC | PRN

## 2024-06-02 NOTE — ED Notes (Signed)
 Called to give report, asked to call back in five minutes due to RN passing meds.

## 2024-06-02 NOTE — ED Provider Notes (Signed)
 Emergency Medicine Observation Re-evaluation Note  George [name] is a 28 y.o. adult, seen on rounds today.  Pt initially presented to the ED for complaints of Suicidal  Currently, the patient is is no acute distress. Denies any concerns at this time.  Physical Exam  Blood pressure (!) 121/94, pulse 99, temperature 98.5 F (36.9 C), temperature source Oral, resp. rate 18, SpO2 98%.  Physical Exam: General: No apparent distress Pulm: Normal WOB Neuro: Moving all extremities Psych: Resting comfortably     ED Course / MDM     I have reviewed the labs performed to date as well as medications administered while in observation.  Recent changes in the last 24 hours include: No acute events overnight.  Plan   Current plan: Patient awaiting psychiatric disposition.   Patient currently here voluntarily but psychiatry recommends IVC if he attempts to leave.  They recommend inpatient psychiatric treatment.   Kaleesi Guyton, Josette SAILOR, DO 06/02/24 [phone]

## 2024-06-02 NOTE — ED Notes (Signed)
Breakfast tray placed at bedside.  

## 2024-06-02 NOTE — ED Notes (Signed)
 Pt currently sitting in dayroom eating lunch, NAD at this time. Will continue to monitor.
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
## 2024-06-02 NOTE — ED Notes (Signed)
 VOL / consult is done

## 2024-06-02 NOTE — ED Notes (Signed)
 Pt sitting in dayroom watching television and interacting with peers. No acute distress noted. No concerns voiced. Informed pt to notify staff with any needs or assistance. Pt verbalized understanding and agreement. Will continue to monitor for safety.

## 2024-06-02 NOTE — ED Notes (Signed)
 Pt is sleeping at the moment in his room. No acute distress noted. Q15 safety checks in place.

## 2024-06-02 NOTE — ED Notes (Signed)
 Patient belonging placed in a labeled storage bin

## 2024-06-02 NOTE — ED Notes (Signed)

## 2024-06-02 NOTE — ED Notes (Signed)
 Pt up to use restroom, steady gait, NAD at this time.

## 2024-06-02 NOTE — ED Notes (Signed)
 Pt talking to behavioral health specialist. Pt back to bed and requesting cup of water. Water given to pts at this time.

## 2024-06-02 NOTE — Group Note (Signed)
 Group Topic: Wellness  Group Date: 06/02/2024 Start Time: 1300 End Time: 1345 Facilitators: Alyse Leilani LABOR, NT  Department: [facility]  Number of Participants: 9  Group Focus: activities of daily living skills and chemical dependency issues Treatment Modality:  Patient-Centered Therapy, Psychoeducation, and Skills Training Interventions utilized were clarification and mental fitness Purpose: enhance coping skills, express feelings, and improve communication skills  Name: George [name] Date of Birth: 09/12/1995  MR: [id number]    Pt was not on the unit. Patients Problems:  Patient Active Problem List   Diagnosis Date Noted   Borderline personality disorder (HCC) 06/02/2024   Housing instability 06/02/2024   MDD (major depressive disorder), recurrent episode, severe (HCC) 12/06/2023   MDD (major depressive disorder), recurrent severe, without psychosis (HCC) 05/10/2023   Gender dysphoria 05/08/2023   Hormone replacement therapy 01/29/2019   Suicidal ideation 01/29/2019   Anxiety 01/29/2019   PTSD (post-traumatic stress disorder) 01/29/2019

## 2024-06-02 NOTE — ED Notes (Addendum)
 Pt transferred from [facility] to [facility] endorsing SI w/plan to lay on train track and get run over. Pt states, I have too many things in my life that's going wrong right now and I can't seem to fix it, so why not? It seem like a good way to end my life (laughing). Pt is transgender and request to be identified as She, her or [name]. Pt presents with childlike, sarcastic behaviors. Pt denies HI/AVH. Pt reports being homeless for approx. 5 months now. Cooperative throughout interview process. Skin assessment completed. Oriented to unit. Meal and drink offered. Pt verbally contract for safety. Will monitor for safety.

## 2024-06-02 NOTE — ED Notes (Signed)
 Lunch tray provided to pt.

## 2024-06-02 NOTE — Group Note (Signed)
 Group Topic: Relapse and Recovery  Group Date: 06/02/2024 Start Time: 2000 End Time: 2100 Facilitators: Joan Plowman B  Department: [facility]  Number of Participants: 8  Group Focus: abuse issues and co-dependency Treatment Modality:  Psychoeducation and Spiritual Interventions utilized were leisure development Purpose: enhance coping skills and relapse prevention strategies  Name: George [name] Date of Birth: [birth year]  MR: [id number]    Level of Participation: active Quality of Participation: attentive and cooperative Interactions with others: gave feedback Mood/Affect: appropriate Triggers (if applicable): NA Cognition: coherent/clear Progress: Gaining insight Response: NA Plan: patient will be encouraged to go to groups  Patients Problems:  Patient Active Problem List   Diagnosis Date Noted   Borderline personality disorder (HCC) 06/02/2024   Housing instability 06/02/2024   Major depressive disorder 06/02/2024   MDD (major depressive disorder), recurrent episode, severe (HCC) 12/06/2023   MDD (major depressive disorder), recurrent severe, without psychosis (HCC) 05/10/2023   Gender dysphoria 05/08/2023   Hormone replacement therapy 01/29/2019   Suicidal ideation 01/29/2019   Anxiety 01/29/2019   PTSD (post-traumatic stress disorder) 01/29/2019

## 2024-06-03 DIAGNOSIS — F332 Major depressive disorder, recurrent severe without psychotic features: Secondary | ICD-10-CM | POA: Diagnosis not present

## 2024-06-03 DIAGNOSIS — R45851 Suicidal ideations: Secondary | ICD-10-CM | POA: Diagnosis not present

## 2024-06-03 DIAGNOSIS — F411 Generalized anxiety disorder: Secondary | ICD-10-CM | POA: Diagnosis not present

## 2024-06-03 DIAGNOSIS — R454 Irritability and anger: Secondary | ICD-10-CM | POA: Diagnosis not present

## 2024-06-03 MED ORDER — FLUOXETINE HCL 10 MG PO CAPS
10.0000 mg | ORAL_CAPSULE | Freq: Every day | ORAL | Status: DC
  Administered 2024-06-03 – 2024-06-05 (×3): 10 mg via ORAL
  Filled 2024-06-03 (×3): qty 1

## 2024-06-03 NOTE — ED Provider Notes (Signed)
 [facility] Admission Suicide Risk Assessment   Nursing information obtained from:    Demographic factors:  Gay, lesbian, or bisexual orientation (transgender) Current Mental Status:  Suicidal ideation indicated by patient, Suicide plan Loss Factors:  Financial problems / change in socioeconomic status Historical Factors:  Impulsivity Risk Reduction Factors:  Positive social support  Total Time spent with patient: 15 minutes Principal Problem: Major depressive disorder Diagnosis:  Principal Problem:   Major depressive disorder  Subjective Data:  George [name] is a 28 year old transgender male to male progress male pronouns.  Carries a diagnosis related to major depressive disorder, generalized anxiety disorder, borderline personality disorder and posttraumatic stress disorder.  Denied the she is currently prescribed any psychotropic medications however felt like Prozac  and hydroxyzine  has been helpful in the past.  [name] reports experiencing suicidal thoughts with a plan to sit on a train track.   Does not sound good presented irritable and condescending throughout this assessment as patient has been seen and evaluated multiple times by this provider.  Denied limited family support.  States mother and siblings currently reside in the [place] area however has a strained relationship.   [name] reports previously being followed by [facility] however is unable to attend their facility due to being red flagged states does not have plan to follow-up there due to family members attending this facility and has a restraining order on her sister.  Reports she was recently from inpatient admission without additional follow-up plan or assessment.   Offered to restart medications however states, I do not have any many or way to continue the medications I do not know what good is going to do?  UDS negative, CBC WBC slight elevation 13.4  Continued Clinical Symptoms:    The Alcohol Use Disorders  Identification Test, Guidelines for Use in Primary Care, Second Edition.  World Science writer [facility]). Score between 0-7:  no or low risk or alcohol related problems. Score between 8-15:  moderate risk of alcohol related problems. Score between 16-19:  high risk of alcohol related problems. Score 20 or above:  warrants further diagnostic evaluation for alcohol dependence and treatment.   CLINICAL FACTORS:   Severe Anxiety and/or Agitation Depression:   Aggression   Musculoskeletal: Strength & Muscle Tone: within normal limits Gait & Station: normal Patient leans: N/A  Psychiatric Specialty Exam:  Presentation  General Appearance:  Appropriate for Environment  Eye Contact: Good  Speech: Clear and Coherent  Speech Volume: Normal  Handedness: Right   Mood and Affect  Mood: Euthymic  Affect: Congruent   Thought Process  Thought Processes: Coherent  Descriptions of Associations:Intact  Orientation:Full (Time, Place and Person)  Thought Content:Logical  History of Schizophrenia/Schizoaffective disorder:No  Duration of Psychotic Symptoms:No data recorded Hallucinations:Hallucinations: None  Ideas of Reference:None  Suicidal Thoughts:Suicidal Thoughts: Yes, Passive  Homicidal Thoughts:Homicidal Thoughts: No   Sensorium  Memory: Immediate Good; Recent Good  Judgment: Poor  Insight: Good   Executive Functions  Concentration: Fair  Attention Span: Good  Recall: Fair  Fund of Knowledge: Good  Language: Good   Psychomotor Activity  Psychomotor Activity: Psychomotor Activity: Normal   Assets  Assets: Desire for Improvement   Sleep  Sleep: Sleep: Fair    Physical Exam: Physical Exam Vitals and nursing note reviewed.  Cardiovascular:     Rate and Rhythm: Normal rate.     Pulses: Normal pulses.  Neurological:     Mental Status: She is alert and oriented to person, place, and time.    Review  of Systems   Psychiatric/Behavioral:  Positive for depression and suicidal ideas. The patient is nervous/anxious.   All other systems reviewed and are negative.  Blood pressure 112/84, pulse 71, temperature 98.1 F (36.7 C), temperature source Oral, resp. rate 18, height 5' 8 (1.727 m), weight 126 lb (57.2 kg), SpO2 100%. Body mass index is 19.16 kg/m.   COGNITIVE FEATURES THAT CONTRIBUTE TO RISK:  Closed-mindedness    SUICIDE RISK:   Moderate:  Frequent suicidal ideation with limited intensity, and duration, some specificity in terms of plans, no associated intent, good self-control, limited dysphoria/symptomatology, some risk factors present, and identifiable protective factors, including available and accessible social support.  PLAN OF CARE:  Continue with current treatment plan on 06/03/2024 as listed below except were noted   - Discussed restarting fluoxetine  10 mg daily - Initiated hydroxyzine  25 mg p.o. 3 times daily as needed - Restarted trazodone  50 mg nightly as needed for sleep disturbance   CSW to continue working on discharge disposition Patient encouraged to participate in therapeutic milieu  I certify that inpatient services furnished can reasonably be expected to improve the patient's condition.   Staci LOISE Kerns, NP 06/03/2024, 11:15 AM

## 2024-06-03 NOTE — Group Note (Signed)
 Group Topic: Healthy Self Image and Positive Change  Group Date: 06/03/2024 Start Time: 1400 End Time: 1445 Facilitators: Elnor Keven SAILOR  Department: [facility]  Number of Participants: 7  Group Focus: affirmation Treatment Modality:  Psychoeducation Interventions utilized were support Purpose: reinforce self-care  Name: George [name] Date of Birth: 03-Nov-1995  MR: [id number]    Level of Participation: None Quality of Participation: None Interactions with others: Pt slept with head on the table throughout group. Mood/Affect: NA Triggers (if applicable): NA Cognition: NA Progress: None Response: Sleeping while in the room. Plan: follow-up needed  Patients Problems:  Patient Active Problem List   Diagnosis Date Noted   Borderline personality disorder (HCC) 06/02/2024   Housing instability 06/02/2024   Major depressive disorder 06/02/2024   MDD (major depressive disorder), recurrent episode, severe (HCC) 12/06/2023   MDD (major depressive disorder), recurrent severe, without psychosis (HCC) 05/10/2023   Gender dysphoria 05/08/2023   Hormone replacement therapy 01/29/2019   Suicidal ideation 01/29/2019   Anxiety 01/29/2019   PTSD (post-traumatic stress disorder) 01/29/2019

## 2024-06-03 NOTE — Progress Notes (Signed)
 Meal given

## 2024-06-03 NOTE — Group Note (Signed)
 Group Topic: Healthy Self Image and Positive Change  Group Date: 06/03/2024 Start Time: 2000 End Time: 2030 Facilitators: Anice Benton LABOR, NT  Department: [facility]  Number of Participants: 5  Group Focus: goals/reality orientation Treatment Modality:  Individual Therapy Interventions utilized were assignment Purpose: express feelings and increase insight  Name: George [name] Date of Birth: [birth year]  MR: [id number]    Level of Participation: minimal Quality of Participation: cooperative Interactions with others: gave feedback Mood/Affect: appropriate Triggers (if applicable): N/A Cognition: coherent/clear Progress: Minimal Response: N/A Plan: follow-up needed  Patients Problems:  Patient Active Problem List   Diagnosis Date Noted   Borderline personality disorder (HCC) 06/02/2024   Housing instability 06/02/2024   Major depressive disorder 06/02/2024   MDD (major depressive disorder), recurrent episode, severe (HCC) 12/06/2023   MDD (major depressive disorder), recurrent severe, without psychosis (HCC) 05/10/2023   Gender dysphoria 05/08/2023   Hormone replacement therapy 01/29/2019   Suicidal ideation 01/29/2019   Anxiety 01/29/2019   PTSD (post-traumatic stress disorder) 01/29/2019

## 2024-06-03 NOTE — ED Notes (Signed)
 Pt refused nicotine patch, stating he doesn't smoke.

## 2024-06-03 NOTE — ED Notes (Signed)
 Pt sitting in dayroom watching television and interacting with peers. No acute distress noted. No concerns voiced. Informed pt to notify staff with any needs or assistance. Pt verbalized understanding and agreement. Will continue to monitor for safety.

## 2024-06-03 NOTE — ED Notes (Addendum)
 Patient A&Ox4. Pt continue to endorse SI w/plan to lay on train track (smiling). Pt states, I can't get a job because I'm a sex offender which puts me being homeless. I can't get any help. So, what's the point. Of course, I don't want to hurt myself but if I can't be productive, then, what's the point to living. Pt's affect is incongruent with what she states AEB pt playful, jokes, laughing throughout assessment and observation on the unit. Denies A/VH. Patient denies any physical complaints when asked. Support and encouragement provided. Routine safety checks conducted according to facility protocol. Encouraged patient to notify staff if thoughts of harm toward self or others arise. Patient verbalize understanding and agreement. Will continue to monitor for safety.

## 2024-06-03 NOTE — ED Notes (Signed)
 Pt is sleeping. No acute distress noted. Respirations are even and labored. Q15 safety checks in place.

## 2024-06-03 NOTE — ED Provider Notes (Addendum)
 Facility Based Crisis Admission H&P  Date: 06/03/24 Patient Name: George [name] MRN: [id number] Chief Complaint:  Stated  I am suicidal  Diagnoses:  Final diagnoses:  MDD (major depressive disorder), recurrent severe, without psychosis (HCC)    HPI: [name] is a 28 year old transgender male to male progress male pronouns.  Carries a diagnosis related to major depressive disorder, generalized anxiety disorder, borderline personality disorder and posttraumatic stress disorder.  Denied the she is currently prescribed any psychotropic medications however felt like Prozac  and hydroxyzine  has been helpful in the past.  [name] reports experiencing suicidal thoughts with a plan to sit on a train track.   Does not sound good presented irritable and condescending throughout this assessment as patient has been seen and evaluated multiple times by this provider.  Denied limited family support.  States mother and siblings currently reside in the [place] area however has a strained relationship.  [name] reports previously being followed by [facility] however is unable to attend their facility due to being red flagged states does not have plan to follow-up there due to family members attending this facility and has a restraining order on her sister.  Reports she was recently from inpatient admission without additional follow-up plan or assessment.  Offered to restart medications however states, I do not have any many or way to continue the medications I do not know what good is going to do?  UDS negative, CBC WBC slight elevation 13.4  During evaluation [name] is sitting); she is alert/oriented x 4; calm/cooperative; and mood congruent with affect.  Patient is speaking in a clear tone at moderate volume, and normal pace; with good eye contact.  Her thought process is coherent and relevant; There is no indication that she is currently responding to internal/external stimuli or  experiencing delusional thought content.  Patient denies suicidal/self-harm/homicidal ideation, psychosis, and paranoia.  Patient has remained calm throughout assessment and has answered questions appropriatel   PHQ 2-9:   Flowsheet Row ED from 06/02/2024 in [facility] ED from 06/01/2024 in [facility] Emergency Department at [facility] (Discharged) from 12/06/2023 in [facility] INPATIENT BEHAVIORAL MEDICINE  C-SSRS RISK CATEGORY High Risk No Risk High Risk      Total Time spent with patient: 15 minutes  Musculoskeletal  Strength & Muscle Tone: within normal limits Gait & Station: normal Patient leans: N/A  Psychiatric Specialty Exam  Presentation General Appearance:  Appropriate for Environment; Casual  Eye Contact: Fair  Speech: Clear and Coherent  Speech Volume: Normal  Handedness: Right   Mood and Affect  Mood: Euthymic  Affect: Appropriate   Thought Process  Thought Processes: Coherent  Descriptions of Associations:Intact  Orientation:Full (Time, Place and Person)  Thought Content:Logical  Diagnosis of Schizophrenia or Schizoaffective disorder in past: No   Hallucinations:No data recorded Ideas of Reference:None  Suicidal Thoughts:No data recorded Homicidal Thoughts:No data recorded  Sensorium  Memory: Immediate Fair; Recent Fair; Remote Fair  Judgment: Fair  Insight: Fair   Art therapist  Concentration: Fair  Attention Span: Fair  Recall: [employer] of Knowledge: Fair  Language: Fair   Psychomotor Activity  Psychomotor Activity:No data recorded  Assets  Assets: Communication Skills; Desire for Improvement; Physical Health   Sleep  Sleep:No data recorded  No data recorded  Physical Exam Vitals and nursing note reviewed.  Constitutional:      Appearance: Normal appearance.  Neurological:     Mental Status: She is alert and oriented to person, place, and  time.   Psychiatric:        Mood and Affect: Mood normal.        Behavior: Behavior normal.        Thought Content: Thought content normal.    ROS  Blood pressure 112/84, pulse 71, temperature 98.1 F (36.7 C), temperature source Oral, resp. rate 18, height 5' 8 (1.727 m), weight 126 lb (57.2 kg), SpO2 100%. Body mass index is 19.16 kg/m.  Past Psychiatric History: See HPI  Is the patient at risk to self? Yes  Has the patient been a risk to self in the past 6 months? Yes .    Has the patient been a risk to self within the distant past? No   Is the patient a risk to others? No   Has the patient been a risk to others in the past 6 months? No   Has the patient been a risk to others within the distant past? No   Past Medical History: Was initiated on HIV infection risk reduction report potential exposure and on previous assessment Family History: Reports multiple family members diagnosed with mental illness however did not elaborate or specify specific diagnosis.  States mother  certifiable crazy younger sister may have autism Social History: Homeless, unemployed however states previously working at [employer] in the past  Last Labs:  Admission on 06/01/2024, Discharged on 06/02/2024  Component Date Value Ref Range Status   Sodium 06/01/2024 139  135 - 145 mmol/L Final   Potassium 06/01/2024 3.7  3.5 - 5.1 mmol/L Final   Chloride 06/01/2024 105  98 - 111 mmol/L Final   CO2 06/01/2024 22  22 - 32 mmol/L Final   Glucose, Bld 06/01/2024 94  70 - 99 mg/dL Final   Glucose reference range applies only to samples taken after fasting for at least 8 hours.   BUN 06/01/2024 16  6 - 20 mg/dL Final   Creatinine, Ser 06/01/2024 0.73  0.61 - 1.24 mg/dL Final   Calcium 89/89/7974 8.5 (L)  8.9 - 10.3 mg/dL Final   Total Protein 89/89/7974 6.6  6.5 - 8.1 g/dL Final   Albumin 89/89/7974 4.0  3.5 - 5.0 g/dL Final   AST 89/89/7974 17  15 - 41 U/L Final   ALT 06/01/2024 10  0 - 44 U/L Final    Alkaline Phosphatase 06/01/2024 58  38 - 126 U/L Final   Total Bilirubin 06/01/2024 0.4  0.0 - 1.2 mg/dL Final   GFR, Estimated 06/01/2024 >60  >60 mL/min Final   Comment: (NOTE) Calculated using the CKD-EPI Creatinine Equation (2021)    Anion gap 06/01/2024 12  5 - 15 Final   Performed at [facility], [address] [place], [place] [address]   Alcohol, Ethyl (B) 06/01/2024 <15  <15 mg/dL Final   Comment: (NOTE) For medical purposes only. Performed at [facility], [address] [place], [place] [address]    WBC 06/01/2024 13.4 (H)  4.0 - 10.5 K/uL Final   RBC 06/01/2024 4.95  4.22 - 5.81 MIL/uL Final   Hemoglobin 06/01/2024 15.2  13.0 - 17.0 g/dL Final   HCT 89/89/7974 42.9  39.0 - 52.0 % Final   MCV 06/01/2024 86.7  80.0 - 100.0 fL Final   MCH 06/01/2024 30.7  26.0 - 34.0 pg Final   MCHC 06/01/2024 35.4  30.0 - 36.0 g/dL Final   RDW 89/89/7974 13.2  11.5 - 15.5 % Final   Platelets 06/01/2024 284  150 -  400 K/uL Final   nRBC 06/01/2024 0.0  0.0 - 0.2 % Final   Performed at [facility], [address] [place], [place] [address]   Tricyclic, Ur Screen 06/01/2024 NONE DETECTED  NONE DETECTED Final   Amphetamines, Ur Screen 06/01/2024 NONE DETECTED  NONE DETECTED Final   MDMA (Ecstasy)Ur Screen 06/01/2024 NONE DETECTED  NONE DETECTED Final   Cocaine Metabolite,Ur [place] 06/01/2024 NONE DETECTED  NONE DETECTED Final   Opiate, Ur Screen 06/01/2024 NONE DETECTED  NONE DETECTED Final   Phencyclidine (PCP) Ur S 06/01/2024 NONE DETECTED  NONE DETECTED Final   Cannabinoid 50 Ng, Ur [place] 06/01/2024 NONE DETECTED  NONE DETECTED Final   Barbiturates, Ur Screen 06/01/2024 NONE DETECTED  NONE DETECTED Final   Benzodiazepine, Ur Scrn 06/01/2024 NONE DETECTED  NONE DETECTED Final   Methadone Scn, Ur 06/01/2024 NONE DETECTED  NONE DETECTED Final   Comment: (NOTE) Tricyclics + metabolites, urine    Cutoff 1000 ng/mL Amphetamines + metabolites, urine  Cutoff 1000  ng/mL MDMA (Ecstasy), urine              Cutoff 500 ng/mL Cocaine Metabolite, urine          Cutoff 300 ng/mL Opiate + metabolites, urine        Cutoff 300 ng/mL Phencyclidine (PCP), urine         Cutoff 25 ng/mL Cannabinoid, urine                 Cutoff 50 ng/mL Barbiturates + metabolites, urine  Cutoff 200 ng/mL Benzodiazepine, urine              Cutoff 200 ng/mL Methadone, urine                   Cutoff 300 ng/mL  The urine drug screen provides only a preliminary, unconfirmed analytical test result and should not be used for non-medical purposes. Clinical consideration and professional judgment should be applied to any positive drug screen result due to possible interfering substances. A more specific alternate chemical method must be used in order to obtain a confirmed analytical result. Gas chromatography / mass spectrometry (GC/MS) is the preferred confirm                          atory method. Performed at [facility], [address] [place], [place] [address]   Admission on 12/05/2023, Discharged on 12/06/2023  Component Date Value Ref Range Status   Sodium 12/05/2023 136  135 - 145 mmol/L Final   Potassium 12/05/2023 4.0  3.5 - 5.1 mmol/L Final   Chloride 12/05/2023 104  98 - 111 mmol/L Final   CO2 12/05/2023 22  22 - 32 mmol/L Final   Glucose, Bld 12/05/2023 93  70 - 99 mg/dL Final   Glucose reference range applies only to samples taken after fasting for at least 8 hours.   BUN 12/05/2023 15  6 - 20 mg/dL Final   Creatinine, Ser 12/05/2023 0.78  0.61 - 1.24 mg/dL Final   Calcium 95/85/7974 8.8 (L)  8.9 - 10.3 mg/dL Final   Total Protein 95/85/7974 6.3 (L)  6.5 - 8.1 g/dL Final   Albumin 95/85/7974 4.0  3.5 - 5.0 g/dL Final   AST 95/85/7974 19  15 - 41 U/L Final   ALT 12/05/2023 18  0 - 44 U/L Final   Alkaline Phosphatase 12/05/2023 45  38 - 126 U/L Final   Total Bilirubin 12/05/2023 0.7  0.0 - 1.2 mg/dL Final   GFR, Estimated 12/05/2023 >60  >60 mL/min  Final   Comment: (NOTE) Calculated using the CKD-EPI Creatinine Equation (2021)    Anion gap 12/05/2023 10  5 - 15 Final   Performed at [facility], [address] [place], [place] [address]   Alcohol, Ethyl (B) 12/05/2023 <10  <10 mg/dL Final   Comment: (NOTE) Lowest detectable limit for serum alcohol is 10 mg/dL.  For medical purposes only. Performed at [facility], [address] [place], [place] [address]    Salicylate Lvl 12/05/2023 <7.0 (L)  7.0 - 30.0 mg/dL Final   Performed at [facility], [address] [place], [place] [address]   Acetaminophen  (Tylenol ), Serum 12/05/2023 <10 (L)  10 - 30 ug/mL Final   Comment: (NOTE) Therapeutic concentrations vary significantly. A range of 10-30 ug/mL  may be an effective concentration for many patients. However, some  are best treated at concentrations outside of this range. Acetaminophen  concentrations >150 ug/mL at 4 hours after ingestion  and >50 ug/mL at 12 hours after ingestion are often associated with  toxic reactions.  Performed at [facility], [address] [place], [place] [address]    WBC 12/05/2023 14.7 (H)  4.0 - 10.5 K/uL Final   RBC 12/05/2023 5.64  4.22 - 5.81 MIL/uL Final   Hemoglobin 12/05/2023 17.5 (H)  13.0 - 17.0 g/dL Final   HCT 95/85/7974 49.2  39.0 - 52.0 % Final   MCV 12/05/2023 87.2  80.0 - 100.0 fL Final   MCH 12/05/2023 31.0  26.0 - 34.0 pg Final   MCHC 12/05/2023 35.6  30.0 - 36.0 g/dL Final   RDW 95/85/7974 12.1  11.5 - 15.5 % Final   Platelets 12/05/2023 376  150 - 400 K/uL Final   nRBC 12/05/2023 0.0  0.0 - 0.2 % Final   Performed at [facility], [address] [place], [place] [address]   Tricyclic, Ur Screen 12/05/2023 NONE DETECTED  NONE DETECTED Final   Amphetamines, Ur Screen 12/05/2023 NONE DETECTED  NONE DETECTED Final   MDMA (Ecstasy)Ur Screen 12/05/2023 NONE DETECTED  NONE DETECTED Final   Cocaine Metabolite,Ur [place] 12/05/2023  NONE DETECTED  NONE DETECTED Final   Opiate, Ur Screen 12/05/2023 NONE DETECTED  NONE DETECTED Final   Phencyclidine (PCP) Ur S 12/05/2023 NONE DETECTED  NONE DETECTED Final   Cannabinoid 50 Ng, Ur [place] 12/05/2023 NONE DETECTED  NONE DETECTED Final   Barbiturates, Ur Screen 12/05/2023 NONE DETECTED  NONE DETECTED Final   Benzodiazepine, Ur Scrn 12/05/2023 NONE DETECTED  NONE DETECTED Final   Methadone Scn, Ur 12/05/2023 NONE DETECTED  NONE DETECTED Final   Comment: (NOTE) Tricyclics + metabolites, urine    Cutoff 1000 ng/mL Amphetamines + metabolites, urine  Cutoff 1000 ng/mL MDMA (Ecstasy), urine              Cutoff 500 ng/mL Cocaine Metabolite, urine          Cutoff 300 ng/mL Opiate + metabolites, urine        Cutoff 300 ng/mL Phencyclidine (PCP), urine         Cutoff 25 ng/mL Cannabinoid, urine                 Cutoff 50 ng/mL Barbiturates + metabolites, urine  Cutoff 200 ng/mL Benzodiazepine, urine              Cutoff 200 ng/mL Methadone, urine  Cutoff 300 ng/mL  The urine drug screen provides only a preliminary, unconfirmed analytical test result and should not be used for non-medical purposes. Clinical consideration and professional judgment should be applied to any positive drug screen result due to possible interfering substances. A more specific alternate chemical method must be used in order to obtain a confirmed analytical result. Gas chromatography / mass spectrometry (GC/MS) is the preferred confirm                          atory method. Performed at [facility], [address] [place], [place] [address]     Allergies: Lithium and Tegretol [carbamazepine]  Medications:  Facility Ordered Medications  Medication   acetaminophen  (TYLENOL ) tablet 650 mg   alum & mag hydroxide-simeth (MAALOX/MYLANTA) 200-200-20 MG/5ML suspension 30 mL   magnesium  hydroxide (MILK OF MAGNESIA) suspension 30 mL   OLANZapine  zydis (ZYPREXA ) disintegrating tablet  5 mg   OLANZapine  (ZYPREXA ) injection 5 mg   OLANZapine  (ZYPREXA ) injection 10 mg   traZODone  (DESYREL ) tablet 50 mg   hydrOXYzine  (ATARAX ) tablet 25 mg   nicotine (NICODERM CQ - dosed in mg/24 hours) patch 14 mg   estradiol  (ESTRACE ) tablet 2 mg   progesterone  (PROMETRIUM ) capsule 100 mg   PTA Medications  Medication Sig   spironolactone  (ALDACTONE ) 100 MG tablet Take 200 mg by mouth at bedtime. (Patient not taking: Reported on 12/06/2023)   progesterone  (PROMETRIUM ) 100 MG capsule Take 100 mg by mouth at bedtime. (Patient not taking: Reported on 12/06/2023)   emtricitabine -tenofovir  (TRUVADA ) 200-300 MG tablet Take 1 tablet by mouth daily. (Patient not taking: Reported on 12/06/2023)   estradiol  (ESTRACE ) 2 MG tablet Take 1.5 mg by mouth 2 (two) times daily. (Patient not taking: Reported on 12/06/2023)   FLUoxetine  (PROZAC ) 40 MG capsule Take 1 capsule (40 mg total) by mouth daily. (Patient not taking: Reported on 06/01/2024)   hydrOXYzine  (ATARAX ) 25 MG tablet Take 1 tablet (25 mg total) by mouth 3 (three) times daily. (Patient not taking: Reported on 06/01/2024)   traZODone  (DESYREL ) 50 MG tablet Take 1 tablet (50 mg total) by mouth at bedtime as needed for sleep. (Patient not taking: Reported on 06/01/2024)    Long Term Goals: Improvement in symptoms so as ready for discharge  Short Term Goals: Patient will verbalize feelings in meetings with treatment team members. and Patient will attend at least of 50% of the groups daily.  Medical Decision Making  Admitted to facility based crisis for medication management and stabilization    Recommendations  Based on my evaluation the patient does not appear to have an emergency medical condition.  Continue with current treatment plan on 06/03/2024 as listed below except were noted  - Discussed restarting fluoxetine  10 mg daily - Initiated hydroxyzine  25 mg p.o. 3 times daily as needed - Restarted trazodone  50 mg nightly as needed for sleep  disturbance  CSW to continue working on discharge disposition Patient encouraged to participate in therapeutic milieu  Staci LOISE Kerns, NP 06/03/24  11:02 AM

## 2024-06-03 NOTE — ED Notes (Signed)
 Pt rates depression 0/10 and anxiety 0/10. Pt reports a hood appetite, and no physical problems. Pt denies SI/HI/AVH and verbally contracts for safety. Provided support and encouragement. Pt safe on the unit. Q 15 minute safety checks continued.

## 2024-06-03 NOTE — Group Note (Signed)
 Group Topic: Communication  Group Date: 06/03/2024 Start Time: 0915 End Time: 0945 Facilitators: Herold Lajuana NOVAK, RN  Department: [facility]  Number of Participants: 7  Group Focus: daily focus Treatment Modality:  Individual Therapy Interventions utilized were patient education Purpose: increase insight  Name: George [name] Date of Birth: [birth year]  MR: [id number]    Level of Participation: moderate Quality of Participation: attentive Interactions with others: gave feedback Mood/Affect: appropriate Triggers (if applicable): none identified Cognition: coherent/clear Progress: Gaining insight Response: pt verbalized indication of medication taken this am Plan: patient will be encouraged to seek staff with any SE from medications taken  Patients Problems:  Patient Active Problem List   Diagnosis Date Noted   Borderline personality disorder (HCC) 06/02/2024   Housing instability 06/02/2024   Major depressive disorder 06/02/2024   MDD (major depressive disorder), recurrent episode, severe (HCC) 12/06/2023   MDD (major depressive disorder), recurrent severe, without psychosis (HCC) 05/10/2023   Gender dysphoria 05/08/2023   Hormone replacement therapy 01/29/2019   Suicidal ideation 01/29/2019   Anxiety 01/29/2019   PTSD (post-traumatic stress disorder) 01/29/2019

## 2024-06-04 MED ORDER — ESTRADIOL 0.5 MG PO TABS
4.0000 mg | ORAL_TABLET | ORAL | Status: DC
  Administered 2024-06-04: 4 mg via ORAL
  Filled 2024-06-04: qty 8

## 2024-06-04 MED ORDER — SPIRONOLACTONE 25 MG PO TABS
200.0000 mg | ORAL_TABLET | Freq: Every day | ORAL | Status: DC
  Administered 2024-06-04: 200 mg via ORAL
  Filled 2024-06-04: qty 8

## 2024-06-04 MED ORDER — ESTRADIOL 0.5 MG PO TABS
2.0000 mg | ORAL_TABLET | Freq: Every day | ORAL | Status: DC
  Administered 2024-06-05: 2 mg via ORAL
  Filled 2024-06-04: qty 4

## 2024-06-04 NOTE — Group Note (Signed)
 Group Topic: Wellness  Group Date: 06/04/2024 Start Time: 2000 End Time: 2030 Facilitators: Anice Benton LABOR, NT  Department: [facility]  Number of Participants: 4  Group Focus: check in and social skills Treatment Modality:  Individual Therapy Interventions utilized were support Purpose: express feelings  Name: Ranveer Wahlstrom Date of Birth: Sep 13, 1995  MR: [id number]    Level of Participation: active Quality of Participation: engaged Interactions with others: gave feedback Mood/Affect: appropriate Triggers (if applicable): N/A Cognition: coherent/clear Progress: Moderate Response: good Plan: follow-up needed  Patients Problems:  Patient Active Problem List   Diagnosis Date Noted   Borderline personality disorder (HCC) 06/02/2024   Housing instability 06/02/2024   Major depressive disorder 06/02/2024   MDD (major depressive disorder), recurrent episode, severe (HCC) 12/06/2023   MDD (major depressive disorder), recurrent severe, without psychosis (HCC) 05/10/2023   Gender dysphoria 05/08/2023   Hormone replacement therapy 01/29/2019   Suicidal ideation 01/29/2019   Anxiety 01/29/2019   PTSD (post-traumatic stress disorder) 01/29/2019

## 2024-06-04 NOTE — ED Notes (Signed)
 Pt observed lying in bed. Eyes closed respirations even and non labored. NAD q 15 minute observations continue for safety.

## 2024-06-04 NOTE — ED Notes (Signed)
 Pt shares that she has alters and her animals are not people. They are siberian huskies and types of wolves.  Pt endorses passive SI at times and reports hx of PTSD.   Q 15 minute observations for safety continue

## 2024-06-04 NOTE — ED Provider Notes (Signed)
 Behavioral Health Progress Note  Date and Time: 06/04/2024 3:11 PM Name: George [name] MRN:  [id number]  Subjective:  Patient reports SI with a plan to be run over by a train. When asked about what keeps pt going pt states I cannot die. I can't push my problems on others. Pt reports being homeless with one friend that might be a housing option but that friend is not calling back. Pt has a long history of 17 hospitalizations and several suicide attempts. Pt was in a group home and in foster care. There is no substance use history.   Diagnosis:  Final diagnoses:  Severe episode of recurrent major depressive disorder, without psychotic features (HCC)    Total Time spent with patient: 45 minutes Past psych history: 62 psych hospitalizations with suicide attempts in the past as weel. Pt reports most serious was an attempted hanging from a shower rod with a vacuum cleaner hose. That did not work. Past Medical History: Was initiated on HIV infection risk reduction report potential exposure and on previous assessment Family History: Reports multiple family members diagnosed with mental illness however did not elaborate or specify specific diagnosis.  States mother  certifiable crazy younger sister may have autism Social History: Homeless, unemployed however states previously working at [employer] in the past  Sleep: Good  Appetite:  Good  Current Medications:  Current Facility-Administered Medications  Medication Dose Route Frequency Provider Last Rate Last Admin   acetaminophen  (TYLENOL ) tablet 650 mg  650 mg Oral Q6H PRN Montague, Crystal J, NP       alum & mag hydroxide-simeth (MAALOX/MYLANTA) 200-200-20 MG/5ML suspension 30 mL  30 mL Oral Q4H PRN Montague, Crystal J, NP       [START ON 06/05/2024] estradiol  (ESTRACE ) tablet 2 mg  2 mg Oral QAC breakfast Maitland Muhlbauer J, MD       estradiol  (ESTRACE ) tablet 4 mg  4 mg Oral Q24H Ranson Belluomini J, MD       FLUoxetine   (PROZAC ) capsule 10 mg  10 mg Oral Daily Lewis, Tanika N, NP   10 mg at 06/04/24 9063   hydrOXYzine  (ATARAX ) tablet 25 mg  25 mg Oral TID PRN Montague, Crystal J, NP       magnesium  hydroxide (MILK OF MAGNESIA) suspension 30 mL  30 mL Oral Daily PRN Montague, Crystal J, NP       OLANZapine  (ZYPREXA ) injection 10 mg  10 mg Intramuscular TID PRN Montague, Crystal J, NP       OLANZapine  (ZYPREXA ) injection 5 mg  5 mg Intramuscular TID PRN Montague, Crystal J, NP       OLANZapine  zydis (ZYPREXA ) disintegrating tablet 5 mg  5 mg Oral TID PRN Montague, Crystal J, NP       progesterone  (PROMETRIUM ) capsule 100 mg  100 mg Oral QHS Bethea, Terrence C, MD   100 mg at 06/03/24 2058   spironolactone  (ALDACTONE ) tablet 200 mg  200 mg Oral QHS Daleyza Gadomski J, MD       traZODone  (DESYREL ) tablet 50 mg  50 mg Oral QHS PRN Montague, Crystal J, NP       Current Outpatient Medications  Medication Sig Dispense Refill   emtricitabine -tenofovir  (TRUVADA ) 200-300 MG tablet Take 1 tablet by mouth daily. (Patient not taking: Reported on 12/06/2023)     estradiol  (ESTRACE ) 2 MG tablet Take 1.5 mg by mouth 2 (two) times daily. (Patient not taking: Reported on 12/06/2023)     FLUoxetine  (PROZAC ) 40 MG capsule  Take 1 capsule (40 mg total) by mouth daily. (Patient not taking: Reported on 06/01/2024) 30 capsule 0   hydrOXYzine  (ATARAX ) 25 MG tablet Take 1 tablet (25 mg total) by mouth 3 (three) times daily. (Patient not taking: Reported on 06/01/2024) 30 tablet 0   progesterone  (PROMETRIUM ) 100 MG capsule Take 100 mg by mouth at bedtime. (Patient not taking: Reported on 12/06/2023)     spironolactone  (ALDACTONE ) 100 MG tablet Take 200 mg by mouth at bedtime. (Patient not taking: Reported on 12/06/2023)     traZODone  (DESYREL ) 50 MG tablet Take 1 tablet (50 mg total) by mouth at bedtime as needed for sleep. (Patient not taking: Reported on 06/01/2024) 30 tablet 0    Labs  Lab Results:  Admission on 06/01/2024, Discharged on  06/02/2024  Component Date Value Ref Range Status   Sodium 06/01/2024 139  135 - 145 mmol/L Final   Potassium 06/01/2024 3.7  3.5 - 5.1 mmol/L Final   Chloride 06/01/2024 105  98 - 111 mmol/L Final   CO2 06/01/2024 22  22 - 32 mmol/L Final   Glucose, Bld 06/01/2024 94  70 - 99 mg/dL Final   Glucose reference range applies only to samples taken after fasting for at least 8 hours.   BUN 06/01/2024 16  6 - 20 mg/dL Final   Creatinine, Ser 06/01/2024 0.73  0.61 - 1.24 mg/dL Final   Calcium 89/89/7974 8.5 (L)  8.9 - 10.3 mg/dL Final   Total Protein 89/89/7974 6.6  6.5 - 8.1 g/dL Final   Albumin 89/89/7974 4.0  3.5 - 5.0 g/dL Final   AST 89/89/7974 17  15 - 41 U/L Final   ALT 06/01/2024 10  0 - 44 U/L Final   Alkaline Phosphatase 06/01/2024 58  38 - 126 U/L Final   Total Bilirubin 06/01/2024 0.4  0.0 - 1.2 mg/dL Final   GFR, Estimated 06/01/2024 >60  >60 mL/min Final   Comment: (NOTE) Calculated using the CKD-EPI Creatinine Equation (2021)    Anion gap 06/01/2024 12  5 - 15 Final   Performed at [facility], [address] [place], [place] [address]   Alcohol, Ethyl (B) 06/01/2024 <15  <15 mg/dL Final   Comment: (NOTE) For medical purposes only. Performed at [facility], [address] [place], [place] [address]    WBC 06/01/2024 13.4 (H)  4.0 - 10.5 K/uL Final   RBC 06/01/2024 4.95  4.22 - 5.81 MIL/uL Final   Hemoglobin 06/01/2024 15.2  13.0 - 17.0 g/dL Final   HCT 89/89/7974 42.9  39.0 - 52.0 % Final   MCV 06/01/2024 86.7  80.0 - 100.0 fL Final   MCH 06/01/2024 30.7  26.0 - 34.0 pg Final   MCHC 06/01/2024 35.4  30.0 - 36.0 g/dL Final   RDW 89/89/7974 13.2  11.5 - 15.5 % Final   Platelets 06/01/2024 284  150 - 400 K/uL Final   nRBC 06/01/2024 0.0  0.0 - 0.2 % Final   Performed at [facility], [address] [place], [place] [address]   Tricyclic, Ur Screen 06/01/2024 NONE DETECTED  NONE DETECTED Final   Amphetamines, Ur Screen 06/01/2024 NONE  DETECTED  NONE DETECTED Final   MDMA (Ecstasy)Ur Screen 06/01/2024 NONE DETECTED  NONE DETECTED Final   Cocaine Metabolite,Ur [place] 06/01/2024 NONE DETECTED  NONE DETECTED Final   Opiate, Ur Screen 06/01/2024 NONE DETECTED  NONE DETECTED Final   Phencyclidine (PCP) Ur S 06/01/2024 NONE DETECTED  NONE DETECTED Final   Cannabinoid  50 Ng, Ur [place] 06/01/2024 NONE DETECTED  NONE DETECTED Final   Barbiturates, Ur Screen 06/01/2024 NONE DETECTED  NONE DETECTED Final   Benzodiazepine, Ur Scrn 06/01/2024 NONE DETECTED  NONE DETECTED Final   Methadone Scn, Ur 06/01/2024 NONE DETECTED  NONE DETECTED Final   Comment: (NOTE) Tricyclics + metabolites, urine    Cutoff 1000 ng/mL Amphetamines + metabolites, urine  Cutoff 1000 ng/mL MDMA (Ecstasy), urine              Cutoff 500 ng/mL Cocaine Metabolite, urine          Cutoff 300 ng/mL Opiate + metabolites, urine        Cutoff 300 ng/mL Phencyclidine (PCP), urine         Cutoff 25 ng/mL Cannabinoid, urine                 Cutoff 50 ng/mL Barbiturates + metabolites, urine  Cutoff 200 ng/mL Benzodiazepine, urine              Cutoff 200 ng/mL Methadone, urine                   Cutoff 300 ng/mL  The urine drug screen provides only a preliminary, unconfirmed analytical test result and should not be used for non-medical purposes. Clinical consideration and professional judgment should be applied to any positive drug screen result due to possible interfering substances. A more specific alternate chemical method must be used in order to obtain a confirmed analytical result. Gas chromatography / mass spectrometry (GC/MS) is the preferred confirm                          atory method. Performed at [facility], [address] [place], [place] [address]   Admission on 12/05/2023, Discharged on 12/06/2023  Component Date Value Ref Range Status   Sodium 12/05/2023 136  135 - 145 mmol/L Final   Potassium 12/05/2023 4.0  3.5 - 5.1 mmol/L Final   Chloride  12/05/2023 104  98 - 111 mmol/L Final   CO2 12/05/2023 22  22 - 32 mmol/L Final   Glucose, Bld 12/05/2023 93  70 - 99 mg/dL Final   Glucose reference range applies only to samples taken after fasting for at least 8 hours.   BUN 12/05/2023 15  6 - 20 mg/dL Final   Creatinine, Ser 12/05/2023 0.78  0.61 - 1.24 mg/dL Final   Calcium 95/85/7974 8.8 (L)  8.9 - 10.3 mg/dL Final   Total Protein 95/85/7974 6.3 (L)  6.5 - 8.1 g/dL Final   Albumin 95/85/7974 4.0  3.5 - 5.0 g/dL Final   AST 95/85/7974 19  15 - 41 U/L Final   ALT 12/05/2023 18  0 - 44 U/L Final   Alkaline Phosphatase 12/05/2023 45  38 - 126 U/L Final   Total Bilirubin 12/05/2023 0.7  0.0 - 1.2 mg/dL Final   GFR, Estimated 12/05/2023 >60  >60 mL/min Final   Comment: (NOTE) Calculated using the CKD-EPI Creatinine Equation (2021)    Anion gap 12/05/2023 10  5 - 15 Final   Performed at [facility], [address] [place], [place] [address]   Alcohol, Ethyl (B) 12/05/2023 <10  <10 mg/dL Final   Comment: (NOTE) Lowest detectable limit for serum alcohol is 10 mg/dL.  For medical purposes only. Performed at [facility], [address] [place], [place] [address]    Salicylate Lvl 12/05/2023 <7.0 (L)  7.0 -  30.0 mg/dL Final   Performed at [facility], [address] [place], [place] [address]   Acetaminophen  (Tylenol ), Serum 12/05/2023 <10 (L)  10 - 30 ug/mL Final   Comment: (NOTE) Therapeutic concentrations vary significantly. A range of 10-30 ug/mL  may be an effective concentration for many patients. However, some  are best treated at concentrations outside of this range. Acetaminophen  concentrations >150 ug/mL at 4 hours after ingestion  and >50 ug/mL at 12 hours after ingestion are often associated with  toxic reactions.  Performed at [facility], [address] [place], [place] [address]    WBC 12/05/2023 14.7 (H)  4.0 - 10.5 K/uL Final   RBC 12/05/2023 5.64  4.22 - 5.81 MIL/uL  Final   Hemoglobin 12/05/2023 17.5 (H)  13.0 - 17.0 g/dL Final   HCT 95/85/7974 49.2  39.0 - 52.0 % Final   MCV 12/05/2023 87.2  80.0 - 100.0 fL Final   MCH 12/05/2023 31.0  26.0 - 34.0 pg Final   MCHC 12/05/2023 35.6  30.0 - 36.0 g/dL Final   RDW 95/85/7974 12.1  11.5 - 15.5 % Final   Platelets 12/05/2023 376  150 - 400 K/uL Final   nRBC 12/05/2023 0.0  0.0 - 0.2 % Final   Performed at [facility], [address] [place], [place] [address]   Tricyclic, Ur Screen 12/05/2023 NONE DETECTED  NONE DETECTED Final   Amphetamines, Ur Screen 12/05/2023 NONE DETECTED  NONE DETECTED Final   MDMA (Ecstasy)Ur Screen 12/05/2023 NONE DETECTED  NONE DETECTED Final   Cocaine Metabolite,Ur [place] 12/05/2023 NONE DETECTED  NONE DETECTED Final   Opiate, Ur Screen 12/05/2023 NONE DETECTED  NONE DETECTED Final   Phencyclidine (PCP) Ur S 12/05/2023 NONE DETECTED  NONE DETECTED Final   Cannabinoid 50 Ng, Ur [place] 12/05/2023 NONE DETECTED  NONE DETECTED Final   Barbiturates, Ur Screen 12/05/2023 NONE DETECTED  NONE DETECTED Final   Benzodiazepine, Ur Scrn 12/05/2023 NONE DETECTED  NONE DETECTED Final   Methadone Scn, Ur 12/05/2023 NONE DETECTED  NONE DETECTED Final   Comment: (NOTE) Tricyclics + metabolites, urine    Cutoff 1000 ng/mL Amphetamines + metabolites, urine  Cutoff 1000 ng/mL MDMA (Ecstasy), urine              Cutoff 500 ng/mL Cocaine Metabolite, urine          Cutoff 300 ng/mL Opiate + metabolites, urine        Cutoff 300 ng/mL Phencyclidine (PCP), urine         Cutoff 25 ng/mL Cannabinoid, urine                 Cutoff 50 ng/mL Barbiturates + metabolites, urine  Cutoff 200 ng/mL Benzodiazepine, urine              Cutoff 200 ng/mL Methadone, urine                   Cutoff 300 ng/mL  The urine drug screen provides only a preliminary, unconfirmed analytical test result and should not be used for non-medical purposes. Clinical consideration and professional judgment should be applied to any  positive drug screen result due to possible interfering substances. A more specific alternate chemical method must be used in order to obtain a confirmed analytical result. Gas chromatography / mass spectrometry (GC/MS) is the preferred confirm  atory method. Performed at [facility], [address] [place], [place] [address]     Blood Alcohol level:  Lab Results  Component Value Date   [facility] <15 06/01/2024   ETH <10 12/05/2023    Metabolic Disorder Labs: Lab Results  Component Value Date   HGBA1C 5.1 05/10/2023   MPG 100 05/10/2023   No results found for: PROLACTIN Lab Results  Component Value Date   CHOL 193 05/10/2023   TRIG 77 05/10/2023   HDL 44 05/10/2023   CHOLHDL 4.4 05/10/2023   VLDL 15 05/10/2023   LDLCALC 134 (H) 05/10/2023    Therapeutic Lab Levels: No results found for: LITHIUM No results found for: VALPROATE No results found for: CBMZ  Physical Findings   AIMS    Flowsheet Row [facility]  AIMS Total Score 0   AUDIT    Flowsheet Row Admission (Discharged) from 12/06/2023 in [facility]PAT[facility]INE Ad[facility]  Alcohol Use Disorder Identification Test Final Score (AUDIT) 1 1   PHQ2-9    Flowsheet Row ED from 06/02/2024 in [facility]  PHQ-2 Total Score 6  PHQ-9 Total Score 19   Flowsheet Row ED from 06/02/2024 in [facility] ED from 06/01/2024 in [facility] Emergency Department at [facility] Admission (Discharged) from 12/06/2023 in [facility] INPATIENT BEHAVIORAL MEDICINE  C-SSRS RISK CATEGORY Error: Q3, 4, or 5 should not be populated when Q2 is No No Risk High Risk     Musculoskeletal  Strength & Muscle Tone: within normal limits Gait & Station: normal Patient leans:  N/A  Psychiatric Specialty Exam  Presentation  General Appearance:  Appropriate for Environment  Eye Contact: Good  Speech: Clear and Coherent  Speech Volume: Normal  Handedness: Right   Mood and Affect  Mood: Euthymic  Affect: Congruent   Thought Process  Thought Processes: Coherent  Descriptions of Associations:Intact  Orientation:Full (Time, Place and Person)  Thought Content:Logical  Diagnosis of Schizophrenia or Schizoaffective disorder in past: No    Hallucinations:Hallucinations: None  Ideas of Reference:None  Suicidal Thoughts:Suicidal Thoughts: Yes, Active SI Active Intent and/or Plan: With Plan; Without Means to Carry Out  Homicidal Thoughts:Homicidal Thoughts: No   Sensorium  Memory: Immediate Good; Recent Good; Remote Good  Judgment: Poor  Insight: Poor; Lacking   Executive Functions  Concentration: Good  Attention Span: Good  Recall: Good  Fund of Knowledge: Good  Language: Good   Psychomotor Activity  Psychomotor Activity: Psychomotor Activity: Normal   Assets  Assets: Communication Skills; Desire for Improvement; Physical Health   Sleep  Sleep: Sleep: Good  Estimated Sleeping Duration (Last 24 Hours): 9.75-10.25 hours  Nutritional Assessment (For [facility] and [facility] admissions only) Has the patient had a weight loss or gain of 10 pounds or more in the last 3 months?: No Has the patient had a decrease in food intake/or appetite?: No Does the patient have dental problems?: No Does the patient have eating habits or behaviors that may be indicators of an eating disorder including binging or inducing vomiting?: No Has the patient recently lost weight without trying?: 0 Has the patient been eating poorly because of a decreased appetite?: 0 Malnutrition Screening Tool Score: 0    Physical Exam  Physical Exam HENT:     Head: Normocephalic and atraumatic.     Right Ear: External ear normal.     Left  Ear:  External ear normal.     Nose: Nose normal.  Eyes:     Conjunctiva/sclera: Conjunctivae normal.  Pulmonary:     Effort: Pulmonary effort is normal.  Musculoskeletal:        General: Normal range of motion.     Cervical back: Normal range of motion.  Neurological:     Mental Status: She is alert and oriented to person, place, and time.    Review of Systems  Constitutional:  Negative for chills, fever, malaise/fatigue and weight loss.  Eyes:  Negative for blurred vision.  Respiratory:  Negative for cough.   Cardiovascular:  Negative for chest pain and palpitations.  Gastrointestinal:  Negative for heartburn, nausea and vomiting.  Genitourinary:  Negative for dysuria.  Musculoskeletal:  Negative for myalgias.  Neurological:  Negative for dizziness, tingling and headaches.  Psychiatric/Behavioral:  Positive for depression and suicidal ideas. Negative for hallucinations, memory loss and substance abuse. The patient is not nervous/anxious and does not have insomnia.    Blood pressure 111/83, pulse 73, temperature 98.2 F (36.8 C), temperature source Oral, resp. rate 17, height 5' 8 (1.727 m), weight 57.2 kg, SpO2 99%. Body mass index is 19.16 kg/m.  Treatment Plan Summary: Daily contact with patient to assess and evaluate symptoms and progress in treatment, Medication management, and Plan Based on my evaluation the patient does not appear to have an emergency medical condition.   -  fluoxetine  10 mg daily pt previously at 40 mg - hydroxyzine  25 mg p.o. 3 times daily as needed - trazodone  50 mg nightly as needed for sleep disturbance  Gender dysphoria issues Pt is take androgen blockers and estrogen and progesterone  hormone.   CSW to continue working on discharge disposition Patient encouraged to participate in therapeutic milieu Garvin JINNY Gaines, MD 06/04/2024 3:11 PM

## 2024-06-04 NOTE — Care Management (Signed)
 [facility] Care Management  George [name] is a 28 year old transgender male to male progress male pronouns.   Writer met with patient.   Patient denied SA use, stated I drink maybe once a year. Patient stated current concerns is her MH, Patient is homeless and resides in [place] area. Patient stated she is on probation for 1 year and no pending court cases. Patient stated she is redflagged from [facility] in [place]. Patient stated she has [employer] with [employer] will provide patient with list of shelters and providers for her contact and arrange appointment

## 2024-06-04 NOTE — ED Notes (Signed)
 Pt reports taking Spironolactone  100mg  at bedtime and not 200mg  as scheduled. Due to that pt took 100mg , instead of the scheduled dosage of 200mg . Pt also reports she takes 200 mg of progesterone  at bedtime and not 200mg .

## 2024-06-04 NOTE — ED Notes (Addendum)
 Paitent had dinner.

## 2024-06-04 NOTE — ED Notes (Signed)
 Pt is sleeping in his room. No cute distress noted. Q15 safety checks in place.

## 2024-06-04 NOTE — ED Notes (Signed)
 Paitent had breakfast.

## 2024-06-04 NOTE — ED Notes (Signed)
 Pt observable in milieu.   Interacting appropriately with staff and peers.  Calm and pleasant. Denied current SI plan and intent.   Denied HI and A/V hallucinations. Q 15 minute observations for safety continue

## 2024-06-04 NOTE — Group Note (Signed)
 Group Topic: Relapse and Recovery  Group Date: 06/04/2024 Start Time: 1740 End Time: 1800 Facilitators: Carletha Iha, RN  Department: [facility]  Number of Participants: 7  Group Focus: personal responsibility Treatment Modality:  Psychoeducation Interventions utilized were patient education Purpose: express feelings  Name: George [name] Date of Birth: November 28, 1995  MR: [id number]    Level of Participation: moderate Quality of Participation: cooperative Interactions with others: intrusive Mood/Affect: appropriate Triggers (if applicable):  Cognition: coherent/clear Progress: Moderate Response:  Plan: patient will be encouraged to practice relaxation skills after discharge  Patients Problems:  Patient Active Problem List   Diagnosis Date Noted   Borderline personality disorder (HCC) 06/02/2024   Housing instability 06/02/2024   Major depressive disorder 06/02/2024   MDD (major depressive disorder), recurrent episode, severe (HCC) 12/06/2023   MDD (major depressive disorder), recurrent severe, without psychosis (HCC) 05/10/2023   Gender dysphoria 05/08/2023   Hormone replacement therapy 01/29/2019   Suicidal ideation 01/29/2019   Anxiety 01/29/2019   PTSD (post-traumatic stress disorder) 01/29/2019

## 2024-06-04 NOTE — Care Management (Addendum)
 [facility] Care Management  1:18 pm Writer faxed referral to [facility] for review  3:00 pm Writer reached out to [employer], patient was declined. From their records on previous in Patients, patient shows as Autistic.  Writer provided patient with list of OPT providers for her to call

## 2024-06-05 ENCOUNTER — Inpatient Hospital Stay (HOSPITAL_COMMUNITY)
Admission: AD | Admit: 2024-06-05 | Discharge: 2024-06-13 | DRG: 885 | Disposition: A | Discharge status: Home / Self Care | Source: Other Acute Inpatient Hospital

## 2024-06-05 ENCOUNTER — Other Ambulatory Visit: Payer: Self-pay

## 2024-06-05 ENCOUNTER — Encounter (HOSPITAL_COMMUNITY): Payer: Self-pay

## 2024-06-05 DIAGNOSIS — Z5941 Food insecurity: Secondary | ICD-10-CM | POA: Diagnosis not present

## 2024-06-05 DIAGNOSIS — Z62810 Personal history of physical and sexual abuse in childhood: Secondary | ICD-10-CM

## 2024-06-05 DIAGNOSIS — Z23 Encounter for immunization: Secondary | ICD-10-CM

## 2024-06-05 DIAGNOSIS — Z5982 Transportation insecurity: Secondary | ICD-10-CM | POA: Diagnosis not present

## 2024-06-05 DIAGNOSIS — Z5902 Unsheltered homelessness: Secondary | ICD-10-CM

## 2024-06-05 DIAGNOSIS — Z560 Unemployment, unspecified: Secondary | ICD-10-CM

## 2024-06-05 DIAGNOSIS — T43226A Underdosing of selective serotonin reuptake inhibitors, initial encounter: Secondary | ICD-10-CM | POA: Diagnosis present

## 2024-06-05 DIAGNOSIS — F32A Depression, unspecified: Principal | ICD-10-CM | POA: Diagnosis present

## 2024-06-05 DIAGNOSIS — Z91199 Patient's noncompliance with other medical treatment and regimen due to unspecified reason: Secondary | ICD-10-CM | POA: Diagnosis not present

## 2024-06-05 DIAGNOSIS — R45851 Suicidal ideations: Secondary | ICD-10-CM | POA: Diagnosis present

## 2024-06-05 DIAGNOSIS — F4481 Dissociative identity disorder: Secondary | ICD-10-CM | POA: Diagnosis present

## 2024-06-05 DIAGNOSIS — F419 Anxiety disorder, unspecified: Secondary | ICD-10-CM | POA: Diagnosis present

## 2024-06-05 DIAGNOSIS — F332 Major depressive disorder, recurrent severe without psychotic features: Secondary | ICD-10-CM | POA: Diagnosis present

## 2024-06-05 DIAGNOSIS — F640 Transsexualism: Secondary | ICD-10-CM | POA: Diagnosis present

## 2024-06-05 DIAGNOSIS — Z9151 Personal history of suicidal behavior: Secondary | ICD-10-CM | POA: Diagnosis not present

## 2024-06-05 DIAGNOSIS — Z888 Allergy status to other drugs, medicaments and biological substances status: Secondary | ICD-10-CM | POA: Diagnosis not present

## 2024-06-05 DIAGNOSIS — F4310 Post-traumatic stress disorder, unspecified: Secondary | ICD-10-CM | POA: Diagnosis present

## 2024-06-05 DIAGNOSIS — Z79899 Other long term (current) drug therapy: Secondary | ICD-10-CM

## 2024-06-05 DIAGNOSIS — Z653 Problems related to other legal circumstances: Secondary | ICD-10-CM | POA: Diagnosis not present

## 2024-06-05 DIAGNOSIS — Z79890 Hormone replacement therapy (postmenopausal): Secondary | ICD-10-CM | POA: Diagnosis not present

## 2024-06-05 DIAGNOSIS — Z59819 Housing instability, housed unspecified: Secondary | ICD-10-CM | POA: Diagnosis present

## 2024-06-05 DIAGNOSIS — F909 Attention-deficit hyperactivity disorder, unspecified type: Secondary | ICD-10-CM | POA: Diagnosis present

## 2024-06-05 DIAGNOSIS — F603 Borderline personality disorder: Secondary | ICD-10-CM | POA: Diagnosis present

## 2024-06-05 DIAGNOSIS — F3162 Bipolar disorder, current episode mixed, moderate: Principal | ICD-10-CM | POA: Insufficient documentation

## 2024-06-05 MED ORDER — ACETAMINOPHEN 325 MG PO TABS: 650.0000 mg | ORAL_TABLET | Freq: Four times a day (QID) | ORAL | Status: DC | PRN

## 2024-06-05 MED ORDER — PROGESTERONE MICRONIZED 100 MG PO CAPS
200.0000 mg | ORAL_CAPSULE | Freq: Every day | ORAL | Status: DC
  Administered 2024-06-05 – 2024-06-12 (×8): 200 mg via ORAL
  Filled 2024-06-05 (×11): qty 2

## 2024-06-05 MED ORDER — ESTRADIOL 1 MG PO TABS
4.0000 mg | ORAL_TABLET | ORAL | Status: DC
  Administered 2024-06-05 – 2024-06-12 (×8): 4 mg via ORAL
  Filled 2024-06-05 (×8): qty 4

## 2024-06-05 MED ORDER — SPIRONOLACTONE 100 MG PO TABS: 100.0000 mg | ORAL_TABLET | Freq: Every day | ORAL | Status: AC

## 2024-06-05 MED ORDER — PROGESTERONE 200 MG PO CAPS: 200.0000 mg | ORAL_CAPSULE | Freq: Every day | ORAL | Status: DC

## 2024-06-05 MED ORDER — FLUOXETINE HCL 10 MG PO CAPS
10.0000 mg | ORAL_CAPSULE | Freq: Every day | ORAL | Status: DC
  Administered 2024-06-06: 10 mg via ORAL
  Filled 2024-06-05: qty 1

## 2024-06-05 MED ORDER — OLANZAPINE 10 MG IM SOLR: 5.0000 mg | Freq: Three times a day (TID) | INTRAMUSCULAR | Status: DC | PRN

## 2024-06-05 MED ORDER — OLANZAPINE 5 MG PO TBDP: 5.0000 mg | ORAL_TABLET | Freq: Three times a day (TID) | ORAL | Status: DC | PRN

## 2024-06-05 MED ORDER — MAGNESIUM HYDROXIDE 400 MG/5ML PO SUSP: 30.0000 mL | Freq: Every day | ORAL | Status: DC | PRN

## 2024-06-05 MED ORDER — PROGESTERONE MICRONIZED 100 MG PO CAPS: 200.0000 mg | ORAL_CAPSULE | Freq: Every day | ORAL | Status: DC

## 2024-06-05 MED ORDER — ALUM & MAG HYDROXIDE-SIMETH 200-200-20 MG/5ML PO SUSP: 30.0000 mL | ORAL | Status: DC | PRN

## 2024-06-05 MED ORDER — SPIRONOLACTONE 100 MG PO TABS
100.0000 mg | ORAL_TABLET | Freq: Every day | ORAL | Status: DC
  Administered 2024-06-05 – 2024-06-12 (×8): 100 mg via ORAL
  Filled 2024-06-05 (×11): qty 1

## 2024-06-05 MED ORDER — ESTRADIOL 1 MG PO TABS
2.0000 mg | ORAL_TABLET | Freq: Every day | ORAL | Status: DC
  Administered 2024-06-06 – 2024-06-10 (×5): 2 mg via ORAL
  Filled 2024-06-05 (×3): qty 2

## 2024-06-05 MED ORDER — SPIRONOLACTONE 100 MG PO TABS: 100.0000 mg | ORAL_TABLET | Freq: Every day | ORAL | Status: DC

## 2024-06-05 MED ORDER — INFLUENZA VIRUS VACC SPLIT PF (FLUZONE) 0.5 ML IM SUSY
0.5000 mL | PREFILLED_SYRINGE | INTRAMUSCULAR | Status: AC
  Administered 2024-06-06: 0.5 mL via INTRAMUSCULAR
  Filled 2024-06-05: qty 0.5

## 2024-06-05 MED ORDER — HYDROXYZINE HCL 25 MG PO TABS: 25.0000 mg | ORAL_TABLET | Freq: Three times a day (TID) | ORAL | Status: DC | PRN

## 2024-06-05 MED ORDER — TRAZODONE HCL 50 MG PO TABS
50.0000 mg | ORAL_TABLET | Freq: Every evening | ORAL | Status: DC | PRN
  Administered 2024-06-07: 50 mg via ORAL
  Filled 2024-06-05 (×2): qty 1

## 2024-06-05 MED ORDER — OLANZAPINE 10 MG IM SOLR: 10.0000 mg | Freq: Three times a day (TID) | INTRAMUSCULAR | Status: DC | PRN

## 2024-06-05 NOTE — ED Notes (Signed)
 Patient alert & oriented x4. Patient endorses current SI with plan and intent to [place] railroad tracks and get run over. Despite lack of means while at facility patient endorses this plan to harm herself while at this facility. Patient unable to contract for safety at this time. Patient denies HI. When assessing for A/VH patient responded nothing other than my DID. Patient denies any physical complaints when asked. No acute distress noted. Scheduled medications administered with no complications. Support and encouragement provided. Patient observed in milieu. No inappropriate behaviors observed or reported. Routine safety checks conducted per facility protocol.

## 2024-06-05 NOTE — Care Management (Addendum)
 [facility] Care Management...  Writer met with patient.  Writer informed patient that she will discharging to [facility] today 06/05/24 when a bed comes available.  Patient has been declined at [facility].  Writer provided patient with community resources and shelter information.    10:57 am Patient has been accepted to [facility], bed 301-1

## 2024-06-05 NOTE — ED Provider Notes (Signed)
 FBC/OBS ASAP Discharge Summary  Date and Time: 06/05/2024 10:18 AM  Name: George [name]  MRN:  [id number]   Discharge Diagnoses:  Final diagnoses:  Severe episode of recurrent major depressive disorder, without psychotic features [facility])    Subjective: 28 year old patient who states that there are psychiatric issues with disociative identity disorder, PTSD, ADHD and borderline personality disorder reports that patient takes progesterone  200 mg at bedtime and Spironolactone  100 mg at bedtime which was backwards from the pharmacy documentation and what was collected yesterday. . Sleep pt reports was fine and mood is okay but still has SI as always some days are more than others pt states. Pt's was evaluated face to face an reports to have good energy. Pt reports that pt wants help and will cooperate with treatment and is happy to go to the [facility] as planned. I wouldn't run off and leave all my stuff and I have nothing to run to in [facility]. Pt is a resident of [place].   Stay Summary: Pt prefers to be called [name] and is taking an androgen blocker and cross sex hormones. Patient was admitted and monitored for suicide with 15 minute med checks. Pt consistently states ongoing suicidal ideation with a plan to sit [place] the railroad tracks. Pt will not contract for safety. Pt has no substance use issues. I don't even use tobacco. Pt was cooperative and pleasant on the unit. There were no issues other than continued suicidal ideation and reported dissociative hallucinations. Pt is not responding to internal stimuli. Pt is not paranoid or internally preoccupied.   Total Time spent with patient: 15 minutes  Past psych history: 110 psych hospitalizations with suicide attempts in the past as weel. Pt reports most serious was an attempted hanging from a shower rod with a vacuum cleaner hose. That did not work. Past Medical History: Was initiated on HIV infection risk reduction report potential  exposure and on previous assessment Family History: Reports multiple family members diagnosed with mental illness however did not elaborate or specify specific diagnosis.  States mother  certifiable crazy younger sister may have autism Social History: Homeless, unemployed however states previously working at [employer] in the past Tobacco Cessation:  N/A, patient does not currently use tobacco products  Current Medications:  Current Facility-Administered Medications  Medication Dose Route Frequency Provider Last Rate Last Admin   acetaminophen  (TYLENOL ) tablet 650 mg  650 mg Oral Q6H PRN Montague, Crystal J, NP       alum & mag hydroxide-simeth (MAALOX/MYLANTA) 200-200-20 MG/5ML suspension 30 mL  30 mL Oral Q4H PRN Montague, Crystal J, NP       estradiol  (ESTRACE ) tablet 2 mg  2 mg Oral QAC breakfast Derek Laughter J, MD   2 mg at 06/05/24 9183   estradiol  (ESTRACE ) tablet 4 mg  4 mg Oral Q24H Avanell Banwart J, MD   4 mg at 06/04/24 1926   FLUoxetine  (PROZAC ) capsule 10 mg  10 mg Oral Daily Lewis, Tanika N, NP   10 mg at 06/05/24 9183   hydrOXYzine  (ATARAX ) tablet 25 mg  25 mg Oral TID PRN Montague, Crystal J, NP       magnesium  hydroxide (MILK OF MAGNESIA) suspension 30 mL  30 mL Oral Daily PRN Montague, Crystal J, NP       OLANZapine  (ZYPREXA ) injection 10 mg  10 mg Intramuscular TID PRN Montague, Crystal J, NP       OLANZapine  (ZYPREXA ) injection 5 mg  5  mg Intramuscular TID PRN Montague, Crystal J, NP       OLANZapine  zydis (ZYPREXA ) disintegrating tablet 5 mg  5 mg Oral TID PRN Montague, Crystal J, NP       progesterone  (PROMETRIUM ) capsule 200 mg  200 mg Oral QHS Chermaine Schnyder J, MD       spironolactone  (ALDACTONE ) tablet 100 mg  100 mg Oral QHS Irais Mottram J, MD       traZODone  (DESYREL ) tablet 50 mg  50 mg Oral QHS PRN Montague, Crystal J, NP       Current Outpatient Medications  Medication Sig Dispense Refill   emtricitabine -tenofovir  (TRUVADA ) 200-300 MG  tablet Take 1 tablet by mouth daily. (Patient not taking: Reported on 12/06/2023)     estradiol  (ESTRACE ) 2 MG tablet Take 1.5 mg by mouth 2 (two) times daily. (Patient not taking: Reported on 12/06/2023)     FLUoxetine  (PROZAC ) 40 MG capsule Take 1 capsule (40 mg total) by mouth daily. (Patient not taking: Reported on 06/01/2024) 30 capsule 0   hydrOXYzine  (ATARAX ) 25 MG tablet Take 1 tablet (25 mg total) by mouth 3 (three) times daily. (Patient not taking: Reported on 06/01/2024) 30 tablet 0   progesterone  (PROMETRIUM ) 100 MG capsule Take 100 mg by mouth at bedtime. (Patient not taking: Reported on 12/06/2023)     spironolactone  (ALDACTONE ) 100 MG tablet Take 200 mg by mouth at bedtime. (Patient not taking: Reported on 12/06/2023)     traZODone  (DESYREL ) 50 MG tablet Take 1 tablet (50 mg total) by mouth at bedtime as needed for sleep. (Patient not taking: Reported on 06/01/2024) 30 tablet 0    PTA Medications:  Facility Ordered Medications  Medication   acetaminophen  (TYLENOL ) tablet 650 mg   alum & mag hydroxide-simeth (MAALOX/MYLANTA) 200-200-20 MG/5ML suspension 30 mL   magnesium  hydroxide (MILK OF MAGNESIA) suspension 30 mL   OLANZapine  zydis (ZYPREXA ) disintegrating tablet 5 mg   OLANZapine  (ZYPREXA ) injection 5 mg   OLANZapine  (ZYPREXA ) injection 10 mg   traZODone  (DESYREL ) tablet 50 mg   hydrOXYzine  (ATARAX ) tablet 25 mg   FLUoxetine  (PROZAC ) capsule 10 mg   estradiol  (ESTRACE ) tablet 4 mg   estradiol  (ESTRACE ) tablet 2 mg   spironolactone  (ALDACTONE ) tablet 100 mg   progesterone  (PROMETRIUM ) capsule 200 mg   PTA Medications  Medication Sig   spironolactone  (ALDACTONE ) 100 MG tablet Take 200 mg by mouth at bedtime. (Patient not taking: Reported on 12/06/2023)   progesterone  (PROMETRIUM ) 100 MG capsule Take 100 mg by mouth at bedtime. (Patient not taking: Reported on 12/06/2023)   emtricitabine -tenofovir  (TRUVADA ) 200-300 MG tablet Take 1 tablet by mouth daily. (Patient not taking:  Reported on 12/06/2023)   estradiol  (ESTRACE ) 2 MG tablet Take 1.5 mg by mouth 2 (two) times daily. (Patient not taking: Reported on 12/06/2023)   FLUoxetine  (PROZAC ) 40 MG capsule Take 1 capsule (40 mg total) by mouth daily. (Patient not taking: Reported on 06/01/2024)   hydrOXYzine  (ATARAX ) 25 MG tablet Take 1 tablet (25 mg total) by mouth 3 (three) times daily. (Patient not taking: Reported on 06/01/2024)   traZODone  (DESYREL ) 50 MG tablet Take 1 tablet (50 mg total) by mouth at bedtime as needed for sleep. (Patient not taking: Reported on 06/01/2024)       06/03/2024   11:39 AM  Depression screen PHQ 2/9  Decreased Interest 3  Down, Depressed, Hopeless 3  PHQ - 2 Score 6  Altered sleeping 3  Tired, decreased energy 2  Change in appetite 1  Feeling bad or failure about yourself  3  Trouble concentrating 1  Moving slowly or fidgety/restless 0  Suicidal thoughts 3  PHQ-9 Score 19  Difficult doing work/chores Very difficult    Flowsheet Row ED from 06/02/2024 in [facility] ED from 06/01/2024 in [facility] Emergency Department at [facility] Admission (Discharged) from 12/06/2023 in [facility] INPATIENT BEHAVIORAL MEDICINE  C-SSRS RISK CATEGORY Low Risk No Risk High Risk    Musculoskeletal  Strength & Muscle Tone: within normal limits Gait & Station: normal Patient leans: N/A  Psychiatric Specialty Exam  Presentation  General Appearance:  Appropriate for Environment; Fairly Groomed  Eye Contact: Fair  Speech: Normal Rate  Speech Volume: Normal  Handedness: Right   Mood and Affect  Mood: Euthymic  Affect: Congruent   Thought Process  Thought Processes: Coherent; Goal Directed  Descriptions of Associations:Intact  Orientation:Full (Time, Place and Person)  Thought Content:Logical  Diagnosis of Schizophrenia or Schizoaffective disorder in past: No    Hallucinations:Hallucinations: Visual  Ideas of  Reference:None  Suicidal Thoughts:Suicidal Thoughts: Yes, Active SI Active Intent and/or Plan: With Intent; Without Means to Carry Out  Homicidal Thoughts:Homicidal Thoughts: No   Sensorium  Memory: Immediate Good; Recent Good; Remote Good  Judgment: Fair  Insight: Poor; Fair   Art therapist  Concentration: Good  Attention Span: Good  Recall: Metta Abe of Knowledge: Good  Language: Good   Psychomotor Activity  Psychomotor Activity: Psychomotor Activity: Normal   Assets  Assets: Communication Skills; Resilience   Sleep  Sleep: Sleep: Good  Estimated Sleeping Duration (Last 24 Hours): 8.00-9.00 hours  No data recorded  Physical Exam  Physical Exam Vitals and nursing note reviewed.  Constitutional:      Appearance: Normal appearance. She is normal weight.  HENT:     Head: Normocephalic and atraumatic.     Nose: Nose normal.  Eyes:     Conjunctiva/sclera: Conjunctivae normal.  Musculoskeletal:        General: Normal range of motion.     Cervical back: Normal range of motion.  Neurological:     Mental Status: She is alert and oriented to person, place, and time.    Review of Systems  Constitutional:  Negative for chills, fever and weight loss.  HENT:  Negative for hearing loss and tinnitus.   Eyes:  Negative for blurred vision.  Respiratory:  Negative for cough.   Cardiovascular:  Negative for chest pain and palpitations.  Gastrointestinal:  Negative for heartburn, nausea and vomiting.  Genitourinary:  Negative for dysuria and urgency.  Musculoskeletal:  Negative for myalgias and neck pain.  Skin:  Negative for itching and rash.  Neurological:  Negative for dizziness, tingling and headaches.  Endo/Heme/Allergies:  Does not bruise/bleed easily.  Psychiatric/Behavioral:  Positive for depression, hallucinations and suicidal ideas. Negative for memory loss and substance abuse. The patient is nervous/anxious. The patient does not have  insomnia.    Blood pressure 110/79, pulse 72, temperature 98.3 F (36.8 C), temperature source Oral, resp. rate 18, height 5' 8 (1.727 m), weight 57.2 kg, SpO2 100%. Body mass index is 19.16 kg/m.  Demographic Factors:  Caucasian and Unemployed  Loss Factors: Financial problems/change in socioeconomic status  Historical Factors: Prior suicide attempts, Family history of suicide, Family history of mental illness or substance abuse, and Impulsivity  Risk Reduction Factors:   Sense of responsibility to family  Continued Clinical Symptoms:  Depression:   Insomnia  Cognitive Features That Contribute To Risk:  Closed-mindedness and  Polarized thinking    Suicide Risk:  Severe:  Frequent, intense, and enduring suicidal ideation, specific plan, no subjective intent, but some objective markers of intent (i.e., choice of lethal method), the method is accessible, some limited preparatory behavior, evidence of impaired self-control, severe dysphoria/symptomatology, multiple risk factors present, and few if any protective factors, particularly a lack of social support.  Plan Of Care/Follow-up recommendations:  Activity:  As tolerated. Pt states pt will comply with treatment.  Disposition: [facility]  Garvin JINNY Gaines, MD 06/05/2024, 10:18 AM

## 2024-06-05 NOTE — Group Note (Signed)
 LCSW Group Therapy Note   Group Date: 06/05/2024 Start Time: 1100 End Time: 1200   Participation:  did not attend  Type of Therapy:  Group Therapy  Topic: Healing Flames: Navigating Anger with Compassion  Objective:  Foster self-awareness and promote compassion toward oneself and others when dealing with anger.  Goals:  Help participants understand the underlying emotions and needs fueling anger. Provide coping strategies for healthier emotional expression and anger management.  Summary: This session explored anger as a volcano--an explosion driven by deeper feelings and unmet needs. Participants learned to identify anger triggers and underlying emotions, then practiced coping strategies like deep breathing, physical activity, and journaling. The group discussed healthy ways to manage anger before it escalates, using both personal reflection and shared experiences.  Therapeutic Modalities: Cognitive Behavioral Therapy (CBT): Challenging thoughts that fuel anger. Mindfulness: Increasing awareness of emotions and sensations.   Tesneem Dufrane O Jacklyn Branan, LCSWA 06/05/2024  12:30 PM

## 2024-06-05 NOTE — Progress Notes (Signed)
   06/05/24 1200  Psych Admission Type (Psych Patients Only)  Admission Status Voluntary  Psychosocial Assessment  Patient Complaints Self-harm thoughts  Eye Contact Fair  Facial Expression Animated  Affect Anxious  Speech Unremarkable  Interaction Assertive  Motor Activity Other (Comment) (WNL for pt)  Appearance/Hygiene In scrubs  Behavior Characteristics Appropriate to situation  Mood Pleasant  Thought Process  Coherency WDL  Content WDL  Delusions None reported or observed  Perception WDL  Hallucination None reported or observed  Judgment Impaired  Confusion None  Danger to Self  Current suicidal ideation? Plan;Active  Description of Suicide Plan To sit on train tracks and get hit by train  Self-Injurious Behavior Some self-injurious ideation observed or expressed.  No lethal plan expressed  (pt has no access in facility)  Agreement Not to Harm Self Yes  Description of Agreement verbal  Danger to Others  Danger to Others None reported or observed

## 2024-06-05 NOTE — Progress Notes (Signed)
 Pt has been accepted to [facility] on 06/05/2024 Bed assignment: 301-01  Pt meets inpatient criteria per: Garvin Gaines MD  Attending Physician will be: Dr. Prentis    Report can be called un:lwpu:[place] unit: [phone]  Pt can arrive after: [facility] WILL UPDATE  Care Team Notified: [facility] Cherylynn Ernst RN, [place] Ward RN  [place] Jessice Madill LCSW-A   06/05/2024 10:00 AM

## 2024-06-05 NOTE — Plan of Care (Signed)
   Problem: Education: Goal: Knowledge of [place] [facility] Education information/materials will improve Outcome: Progressing   Problem: Activity: Goal: Interest or engagement in activities will improve Outcome: Progressing   Problem: Coping: Goal: Ability to verbalize frustrations and anger appropriately will improve Outcome: Progressing   Problem: Safety: Goal: Periods of time without injury will increase Outcome: Progressing

## 2024-06-05 NOTE — Plan of Care (Signed)
   Problem: Education: Goal: Knowledge of [name] Education information/materials will improve Outcome: Progressing Goal: Emotional status will improve Outcome: Progressing Goal: Mental status will improve Outcome: Progressing Goal: Verbalization of understanding the information provided will improve Outcome: Progressing   Problem: Activity: Goal: Interest or engagement in activities will improve Outcome: Progressing Goal: Sleeping patterns will improve Outcome: Progressing   Problem: Coping: Goal: Ability to verbalize frustrations and anger appropriately will improve Outcome: Progressing Goal: Ability to demonstrate self-control will improve Outcome: Progressing

## 2024-06-05 NOTE — Progress Notes (Signed)
 Pt admitted to unit, A&Ox4, no acute distress noted. Pt endorses SI with a plan to sint on the train tracks and get hit buy a train. Pt contracts verbally for safety, denies HI, AVH at this time. Reviewed unit schedule and answered patients questions regarding admission, pt verbalized understanding. Patient taken to dinning room for lunch, Q15 minute safety checks in place.

## 2024-06-05 NOTE — Group Note (Signed)
 Date:  06/05/2024 Time:  3:54 PM  Group Topic/Focus:  Boundaries: The focus of this group is to discuss five boundary types and describes what healthy, porous, and rigid boundaries look like for each. This encourages clients to reflect on their boundaries, understand how they differ, and identify strengths and weaknesses.    Participation Level:  Active  Participation Quality:  Appropriate and Attentive  Affect:  Appropriate  Cognitive:  Alert  Insight: Appropriate  Engagement in Group:  Engaged  Modes of Intervention:  Discussion and Education  Huel Mall 06/05/2024, 3:54 PM

## 2024-06-05 NOTE — ED Notes (Signed)
 Patient transferred to [facility] per provider order. Patient discharged in no acute distress, A& O x4 and ambulatory. Patient denied HI, A/VH upon discharge, endorsed SI with a plan to sit on railroad tracks and get ran over. Patient verbalized understanding of admission as explained by staff. Patient mood fair. Patient belongings given to transport service from locker #5 complete and intact. Patient escorted to back sallyport via staff for transport to destination. Safety maintained.

## 2024-06-05 NOTE — [facility] Group Notes (Signed)
 [facility] Group Notes:  (Nursing/MHT/Case Management/Adjunct)  Date:  06/05/2024  Time:  2000  Type of Therapy:  Wrap up group  Participation Level:  Active  Participation Quality:  Sharing and Supportive  Affect:  Excited and Not Congruent  Cognitive:  Alert  Insight:  Limited  Engagement in Group:  Developing/Improving  Modes of Intervention:  Clarification, Education, and Support  Summary of Progress/Problems: Positive thinking and positive change were discussed.   Lenora Manuelita RAMAN 06/05/2024, 8:41 PM

## 2024-06-05 NOTE — Tx Team (Signed)
 Initial Treatment Plan 06/05/2024 12:49 PM George [name] FMW:[id number]    PATIENT STRESSORS: Financial difficulties   Occupational concerns     PATIENT STRENGTHS: Motivation for treatment/growth  Supportive family/friends    PATIENT IDENTIFIED PROBLEMS: Depression  Suicidal Ideation                   DISCHARGE CRITERIA:  Improved stabilization in mood, thinking, and/or behavior Verbal commitment to aftercare and medication compliance  PRELIMINARY DISCHARGE PLAN: Attend aftercare/continuing care group Return to previous living arrangement  PATIENT/FAMILY INVOLVEMENT: This treatment plan has been presented to and reviewed with the patient, George [name]. The patient has been given the opportunity to ask questions and make suggestions.  Huel Mall, RN 06/05/2024, 12:49 PM

## 2024-06-05 NOTE — Discharge Instructions (Signed)
 Patient has been accepted to [facility] bed 301-1

## 2024-06-06 ENCOUNTER — Encounter (HOSPITAL_COMMUNITY): Payer: Self-pay

## 2024-06-06 DIAGNOSIS — F332 Major depressive disorder, recurrent severe without psychotic features: Secondary | ICD-10-CM | POA: Diagnosis not present

## 2024-06-06 DIAGNOSIS — F3162 Bipolar disorder, current episode mixed, moderate: Principal | ICD-10-CM | POA: Insufficient documentation

## 2024-06-06 DIAGNOSIS — F603 Borderline personality disorder: Secondary | ICD-10-CM | POA: Diagnosis not present

## 2024-06-06 MED ORDER — FLUOXETINE HCL 20 MG PO CAPS
20.0000 mg | ORAL_CAPSULE | Freq: Every day | ORAL | Status: DC
  Administered 2024-06-07 – 2024-06-13 (×7): 20 mg via ORAL
  Filled 2024-06-06 (×7): qty 1

## 2024-06-06 MED ORDER — LURASIDONE HCL 40 MG PO TABS
20.0000 mg | ORAL_TABLET | Freq: Every day | ORAL | Status: DC
  Filled 2024-06-06: qty 1

## 2024-06-06 MED ORDER — LURASIDONE HCL 40 MG PO TABS
40.0000 mg | ORAL_TABLET | Freq: Every day | ORAL | Status: DC
  Administered 2024-06-06: 40 mg via ORAL
  Filled 2024-06-06: qty 1

## 2024-06-06 NOTE — Group Note (Signed)
 Date:  06/06/2024 Time:  4:18 PM  Group Topic/Focus:  Personal Choices and Values:   The focus of this group is to help patients assess and explore the importance of values in their lives, how their values affect their decisions, how they express their values and what opposes their expression.    Participation Level:  Active  Participation Quality:  Intrusive  Affect:  Appropriate  Cognitive:  Appropriate  Insight: Good  Engagement in Group:  Monopolizing  Modes of Intervention:  Discussion and Education   George [name] 06/06/2024, 4:18 PM

## 2024-06-06 NOTE — Plan of Care (Signed)
  Problem: Education: Goal: Knowledge of [place] [facility] Education information/materials will improve Outcome: Completed/Met Goal: Mental status will improve Outcome: Progressing Goal: Verbalization of understanding the information provided will improve Outcome: Progressing   Problem: Activity: Goal: Interest or engagement in activities will improve Outcome: Progressing   Problem: Activity: Goal: Interest or engagement in activities will improve Outcome: Progressing

## 2024-06-06 NOTE — [facility] Suicide Risk Assessment (Signed)
 [facility] INPATIENT:  Family/Significant Other Suicide Prevention Education  Suicide Prevention Education:  Education Completed; Uel Davidow (mother) [phone],  (name of family member/significant other) has been identified by the patient as the family member/significant other with whom the patient will be residing, and identified as the person(s) who will aid the patient in the event of a mental health crisis (suicidal ideations/suicide attempt).  With written consent from the patient, the family member/significant other has been provided the following suicide prevention education, prior to the and/or following the discharge of the patient.  The suicide prevention education provided includes the following: Suicide risk factors Suicide prevention and interventions National Suicide Hotline telephone number [facility] assessment telephone number [facility] Emergency Assistance 911 [facility] and/or Residential Mobile Crisis Unit telephone number  Request made of family/significant other to: Remove weapons (e.g., guns, rifles, knives), all items previously/currently identified as safety concern.   Remove drugs/medications (over-the-counter, prescriptions, illicit drugs), all items previously/currently identified as a safety concern.  Rosaline stated Asier Desroches cannot reside in the family home due to them being located near a school (sex offender status). Rosaline stated however that [name] often comes over to do laundry and help around the house. Rosaline has all weapons and medications locked up away from [name]'s access. Rosaline will continue to be a support for [name] and knows who to contact in the event of a mental health crisis.    The family member/significant other verbalizes understanding of the suicide prevention education information provided.  The family member/significant other agrees to remove the items of safety concern listed above.  Louetta Lame 06/06/2024,  3:36 PM
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
## 2024-06-06 NOTE — Group Note (Signed)
 Date:  06/06/2024 Time:  9:55 AM  Group Topic/Focus:  Goals Group:   The focus of this group is to help patients establish daily goals to achieve during treatment and discuss how the patient can incorporate goal setting into their daily lives to aide in recovery.    Participation Level:  Active  Participation Quality:  Appropriate  Affect:  Appropriate  Cognitive:  Alert  Insight: Appropriate  Engagement in Group:  Engaged  Modes of Intervention:  Orientation  Additional Comments:  She wants to be able to no think about being S.I.. Then when she gets out she wants to be able to find a job based off her record and a place to live.  Heriberto Stmartin M Marium Ragan 06/06/2024, 9:55 AM

## 2024-06-06 NOTE — Progress Notes (Signed)
(  Sleep Hours) - 8.5 (Any PRNs that were needed, meds refused, or side effects to meds)- none (Any disturbances and when (visitation, over night)-n/a (Concerns raised by the patient)- none (SI/HI/AVH)- denies

## 2024-06-06 NOTE — BH IP Treatment Plan (Signed)
 Interdisciplinary Treatment and Diagnostic Plan Update  06/06/2024 Time of Session: 1025AM George [name] MRN: [id number]  Principal Diagnosis: MDD (major depressive disorder), recurrent severe, without psychosis (HCC)  Secondary Diagnoses: Principal Problem:   MDD (major depressive disorder), recurrent severe, without psychosis (HCC)   Current Medications:  Current Facility-Administered Medications  Medication Dose Route Frequency Provider Last Rate Last Admin   acetaminophen  (TYLENOL ) tablet 650 mg  650 mg Oral Q6H PRN Gottfried, [name], MD       alum & mag hydroxide-simeth (MAALOX/MYLANTA) 200-200-20 MG/5ML suspension 30 mL  30 mL Oral Q4H PRN Gottfried, [name], MD       estradiol  (ESTRACE ) tablet 2 mg  2 mg Oral QAC breakfast Gottfried, [name], MD   2 mg at 06/06/24 9375   estradiol  (ESTRACE ) tablet 4 mg  4 mg Oral Q24H Gottfried, [name], MD   4 mg at 06/05/24 2107   FLUoxetine  (PROZAC ) capsule 10 mg  10 mg Oral Daily Gottfried, [name], MD   10 mg at 06/06/24 0745   hydrOXYzine  (ATARAX ) tablet 25 mg  25 mg Oral TID PRN Gottfried, [name], MD       magnesium  hydroxide (MILK OF MAGNESIA) suspension 30 mL  30 mL Oral Daily PRN Gottfried, [name], MD       OLANZapine  (ZYPREXA ) injection 10 mg  10 mg Intramuscular TID PRN Gottfried, [name], MD       OLANZapine  (ZYPREXA ) injection 5 mg  5 mg Intramuscular TID PRN Gottfried, [name], MD       OLANZapine  zydis (ZYPREXA ) disintegrating tablet 5 mg  5 mg Oral TID PRN Gottfried, [name], MD       progesterone  (PROMETRIUM ) capsule 200 mg  200 mg Oral QHS [name], [name], MD   200 mg at 06/05/24 2107   spironolactone  (ALDACTONE ) tablet 100 mg  100 mg Oral QHS Gottfried, [name], MD   100 mg at 06/05/24 2108   traZODone  (DESYREL ) tablet 50 mg  50 mg Oral QHS PRN Gottfried, [name], MD       PTA Medications: Medications Prior to Admission  Medication Sig Dispense Refill Last Dose/Taking   emtricitabine -tenofovir  (TRUVADA ) 200-300 MG  tablet Take 1 tablet by mouth daily. (Patient not taking: Reported on 12/06/2023)      estradiol  (ESTRACE ) 2 MG tablet Take 1.5 mg by mouth 2 (two) times daily. (Patient not taking: Reported on 12/06/2023)      FLUoxetine  (PROZAC ) 40 MG capsule Take 1 capsule (40 mg total) by mouth daily. (Patient not taking: Reported on 06/01/2024) 30 capsule 0    hydrOXYzine  (ATARAX ) 25 MG tablet Take 1 tablet (25 mg total) by mouth 3 (three) times daily. (Patient not taking: Reported on 06/01/2024) 30 tablet 0    progesterone  (PROMETRIUM ) 100 MG capsule Take 100 mg by mouth at bedtime. (Patient not taking: Reported on 12/06/2023)      progesterone  (PROMETRIUM ) 200 MG capsule Take 1 capsule (200 mg total) by mouth at bedtime.      spironolactone  (ALDACTONE ) 100 MG tablet Take 200 mg by mouth at bedtime. (Patient not taking: Reported on 12/06/2023)      spironolactone  (ALDACTONE ) 100 MG tablet Take 1 tablet (100 mg total) by mouth at bedtime.      traZODone  (DESYREL ) 50 MG tablet Take 1 tablet (50 mg total) by mouth at bedtime as needed for sleep. (Patient not taking: Reported on 06/01/2024) 30 tablet 0     Patient Stressors: Financial difficulties  Occupational concerns    Patient Strengths: Motivation for treatment/growth  Supportive family/friends   Treatment Modalities: Medication Management, Group therapy, Case management,  1 to 1 session with clinician, Psychoeducation, Recreational therapy.   Physician Treatment Plan for Primary Diagnosis: MDD (major depressive disorder), recurrent severe, without psychosis (HCC) Long Term Goal(s): Improvement in symptoms so as ready for discharge   Short Term Goals: Ability to identify changes in lifestyle to reduce recurrence of condition will improve Ability to verbalize feelings will improve Ability to disclose and discuss suicidal ideas Ability to demonstrate self-control will improve Ability to identify and develop effective coping behaviors will  improve Ability to maintain clinical measurements within normal limits will improve Compliance with prescribed medications will improve Ability to identify triggers associated with substance abuse/mental health issues will improve  Medication Management: Evaluate patient's response, side effects, and tolerance of medication regimen.  Therapeutic Interventions: 1 to 1 sessions, Unit Group sessions and Medication administration.  Evaluation of Outcomes: Not Progressing  Physician Treatment Plan for Secondary Diagnosis: Principal Problem:   MDD (major depressive disorder), recurrent severe, without psychosis (HCC)  Long Term Goal(s): Improvement in symptoms so as ready for discharge   Short Term Goals: Ability to identify changes in lifestyle to reduce recurrence of condition will improve Ability to verbalize feelings will improve Ability to disclose and discuss suicidal ideas Ability to demonstrate self-control will improve Ability to identify and develop effective coping behaviors will improve Ability to maintain clinical measurements within normal limits will improve Compliance with prescribed medications will improve Ability to identify triggers associated with substance abuse/mental health issues will improve     Medication Management: Evaluate patient's response, side effects, and tolerance of medication regimen.  Therapeutic Interventions: 1 to 1 sessions, Unit Group sessions and Medication administration.  Evaluation of Outcomes: Not Progressing   RN Treatment Plan for Primary Diagnosis: MDD (major depressive disorder), recurrent severe, without psychosis (HCC) Long Term Goal(s): Knowledge of disease and therapeutic regimen to maintain health will improve  Short Term Goals: Ability to remain free from injury will improve, Ability to verbalize frustration and anger appropriately will improve, Ability to demonstrate self-control, Ability to participate in decision making will  improve, Ability to verbalize feelings will improve, Ability to disclose and discuss suicidal ideas, Ability to identify and develop effective coping behaviors will improve, and Compliance with prescribed medications will improve  Medication Management: RN will administer medications as ordered by provider, will assess and evaluate patient's response and provide education to patient for prescribed medication. RN will report any adverse and/or side effects to prescribing provider.  Therapeutic Interventions: 1 on 1 counseling sessions, Psychoeducation, Medication administration, Evaluate responses to treatment, Monitor vital signs and CBGs as ordered, Perform/monitor CIWA, COWS, AIMS and Fall Risk screenings as ordered, Perform wound care treatments as ordered.  Evaluation of Outcomes: Not Progressing   LCSW Treatment Plan for Primary Diagnosis: MDD (major depressive disorder), recurrent severe, without psychosis (HCC) Long Term Goal(s): Safe transition to appropriate next level of care at discharge, Engage patient in therapeutic group addressing interpersonal concerns.  Short Term Goals: Engage patient in aftercare planning with referrals and resources, Increase social support, Increase ability to appropriately verbalize feelings, Increase emotional regulation, Facilitate acceptance of mental health diagnosis and concerns, Facilitate patient progression through stages of change regarding substance use diagnoses and concerns, Identify triggers associated with mental health/substance abuse issues, and Increase skills for wellness and recovery  Therapeutic Interventions: Assess for all discharge needs, 1 to 1 time with Child psychotherapist,  Explore available resources and support systems, Assess for adequacy in community support network, Educate family and significant other(s) on suicide prevention, Complete Psychosocial Assessment, Interpersonal group therapy.  Evaluation of Outcomes: Not  Progressing   Progress in Treatment: Attending groups: Yes. Participating in groups: Yes. Taking medication as prescribed: Yes. Toleration medication: Yes. Family/Significant other contact made: No, will contact:  Aulton Routt (mother) [phone] Patient understands diagnosis: Yes. Discussing patient identified problems/goals with staff: Yes. Medical problems stabilized or resolved: Yes. Denies suicidal/homicidal ideation: Yes. Issues/concerns per patient self-inventory: No.  New problem(s) identified: No, Describe:  none  New Short Term/Long Term Goal(s): medication stabilization, elimination of SI thoughts, development of comprehensive mental wellness plan.    Patient Goals:  Eliminate SI thoughts  Discharge Plan or Barriers: Patient recently admitted. CSW will continue to follow and assess for appropriate referrals and possible discharge planning.    Reason for Continuation of Hospitalization: Depression Medication stabilization Suicidal ideation  Estimated Length of Stay: 5-7 days  Last 3 [place] Suicide Severity Risk Score: Flowsheet Row [facility] (Current) [facility] from 06/02/2024 in [facility] ED from 06/01/2024 in [facility] Emergency Department at [facility]  C-SSRS RISK CATEGORY Low Risk Low Risk No Risk    Last [facility] 2/9 Scores:    06/03/2024   11:39 AM  Depression screen PHQ 2/9  Decreased Interest 3  Down, Depressed, Hopeless 3  PHQ - 2 Score 6  Altered sleeping 3  Tired, decreased energy 2  Change in appetite 1  Feeling bad or failure about yourself  3  Trouble concentrating 1  Moving slowly or fidgety/restless 0  Suicidal thoughts 3  PHQ-9 Score 19  Difficult doing work/chores Very difficult    Scribe for Treatment Team: Jenkins LULLA Primer, LCSWA 06/06/2024 1:19 PM
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
## 2024-06-06 NOTE — Progress Notes (Signed)
 Spirituality group facilitated by Elia Rockie Sofia, BCC.  Group Description: Group focused on topic of hope. Patients participated in facilitated discussion around topic, connecting with one another around experiences and definitions for hope. Group members engaged with visual explorer photos, reflecting on what hope looks like for them today. Group engaged in discussion around how their definitions of hope are present today in hospital.  Modalities: Psycho-social ed, Adlerian, Narrative, MI  Patient Progress: [name] attended group and actively engaged and participated in group conversation and activities.  Comments demonstrated good insight and contributed positively to the group conversation.

## 2024-06-06 NOTE — Group Note (Signed)
 Recreation Therapy Group Note   Group Topic:Problem Solving  Group Date: 06/06/2024 Start Time: 0940 End Time: 1010 Facilitators: Tonio Seider-McCall, LRT,CTRS Location: [address] Dayroom   Group Topic: Communication, Team Building, Problem Solving   Goal Area(s) Addresses:  Patient will effectively work with peer towards shared goal.  Patient will identify skills used to make activity successful.  Patient will identify how skills used during activity can be used to reach post d/c goals.    Behavioral Response: Observant   Intervention: STEM Activity   Activity: Landing Pad. In teams of 3-5, patients were given 12 plastic drinking straws and an equal length of masking tape. Using the materials provided, patients were asked to build a landing pad to catch a golf ball dropped from approximately 5 feet in the air. All materials were required to be used by the team in their design. LRT facilitated post-activity discussion.   Education: Pharmacist, community, Scientist, physiological, Discharge Planning    Education Outcome: Acknowledges education/In group clarification offered/Needs additional education.    Affect/Mood: Appropriate   Participation Level: Minimal   Participation Quality: Independent   Behavior: On-looking   Speech/Thought Process: Relevant   Insight: Good   Judgement: Good   Modes of Intervention: STEM Activity   Patient Response to Interventions:  Attentive   Education Outcome:  In group clarification offered    Clinical Observations/Individualized Feedback: Pt didn't participate in activity but was observant ans social with peers. Pt was also social with staff. Pt was called out of group and didn't return.      Plan: Continue to engage patient in RT group sessions 2-3x/week.   Khary Schaben-McCall, LRT,CTRS 06/06/2024 11:31 AM

## 2024-06-06 NOTE — Progress Notes (Signed)
   06/06/24 0800  Psych Admission Type (Psych Patients Only)  Admission Status Voluntary  Psychosocial Assessment  Patient Complaints None  Eye Contact Fair  Facial Expression Animated  Affect Appropriate to circumstance  Speech Logical/coherent  Interaction Assertive  Motor Activity Fidgety  Appearance/Hygiene Unremarkable  Behavior Characteristics Appropriate to situation  Mood Pleasant  Thought Process  Coherency WDL  Content WDL  Delusions None reported or observed  Perception WDL  Hallucination None reported or observed  Judgment Impaired  Confusion None  Danger to Self  Current suicidal ideation? Denies  Description of Suicide Plan none  Self-Injurious Behavior No self-injurious ideation or behavior indicators observed or expressed   Agreement Not to Harm Self Yes  Description of Agreement verbal  Danger to Others  Danger to Others None reported or observed

## 2024-06-06 NOTE — BHH Suicide Risk Assessment (Signed)
 Suicide Risk Assessment  Admission Assessment    [facility] Admission Suicide Risk Assessment   Nursing information obtained from:  Patient, Review of record Demographic factors:  Male, Caucasian, Gay, lesbian, or bisexual orientation, Unemployed Current Mental Status:  Suicidal ideation indicated by patient Loss Factors:  Legal issues, Financial problems / change in socioeconomic status Historical Factors:  Prior suicide attempts, Impulsivity Risk Reduction Factors:  NA  Total Time spent with patient: 1.5 hours Principal Problem: MDD (major depressive disorder), recurrent severe, without psychosis (HCC) Diagnosis:  Principal Problem:   MDD (major depressive disorder), recurrent severe, without psychosis (HCC)  Subjective Data: See H&P   Continued Clinical Symptoms:  Alcohol Use Disorder Identification Test Final Score (AUDIT): 0 The Alcohol Use Disorders Identification Test, Guidelines for Use in Primary Care, Second Edition.  World Science writer [facility]). Score between 0-7:  no or low risk or alcohol related problems. Score between 8-15:  moderate risk of alcohol related problems. Score between 16-19:  high risk of alcohol related problems. Score 20 or above:  warrants further diagnostic evaluation for alcohol dependence and treatment.   CLINICAL FACTORS:   Severe Anxiety and/or Agitation Depression:   Anhedonia Hopelessness Impulsivity Recent sense of peace/wellbeing Severe Personality Disorders:   Cluster B Comorbid depression More than one psychiatric diagnosis Unstable or Poor Therapeutic Relationship Previous Psychiatric Diagnoses and Treatments Medical Diagnoses and Treatments/Surgeries   Musculoskeletal: Strength & Muscle Tone: within normal limits Gait & Station: normal Patient leans: N/A  Psychiatric Specialty Exam:  Presentation  General Appearance:  Appropriate for Environment; Fairly Groomed  Eye Contact: Fair  Speech: Normal Rate  Speech  Volume: Normal  Handedness: Right   Mood and Affect  Mood: Euthymic  Affect: Congruent   Thought Process  Thought Processes: Coherent; Goal Directed  Descriptions of Associations:Intact  Orientation:Full (Time, Place and Person)  Thought Content:Logical  History of Schizophrenia/Schizoaffective disorder:No  Duration of Psychotic Symptoms:No data recorded Hallucinations:Hallucinations: Visual  Ideas of Reference:None  Suicidal Thoughts:Suicidal Thoughts: Yes, Active SI Active Intent and/or Plan: With Intent; Without Means to Carry Out  Homicidal Thoughts:Homicidal Thoughts: No   Sensorium  Memory: Immediate Good; Recent Good; Remote Good  Judgment: Fair  Insight: Poor; Fair   Art therapist  Concentration: Good  Attention Span: Good  Recall: Metta Abe of Knowledge: Good  Language: Good   Psychomotor Activity  Psychomotor Activity: Psychomotor Activity: Normal   Assets  Assets: Communication Skills; Resilience   Sleep  Sleep: Sleep: Good Number of Hours of Sleep: 8    Physical Exam: Physical Exam ROS Blood pressure 112/75, pulse 77, temperature 98.4 F (36.9 C), temperature source Oral, resp. rate 16, height 5' 7 (1.702 m), weight 58.5 kg, SpO2 100%. Body mass index is 20.2 kg/m.   COGNITIVE FEATURES THAT CONTRIBUTE TO RISK:  Polarized thinking    SUICIDE RISK:   Severe:  Frequent, intense, and enduring suicidal ideation, specific plan, no subjective intent, but some objective markers of intent (i.e., choice of lethal method), the method is accessible, some limited preparatory behavior, evidence of impaired self-control, severe dysphoria/symptomatology, multiple risk factors present, and few if any protective factors, particularly a lack of social support.  PLAN OF CARE: See H&P   I certify that inpatient services furnished can reasonably be expected to improve the patient's condition.   Blair Chiquita Hint,  NP 06/06/2024, 7:24 AM

## 2024-06-07 DIAGNOSIS — R45851 Suicidal ideations: Secondary | ICD-10-CM | POA: Diagnosis not present

## 2024-06-07 DIAGNOSIS — F603 Borderline personality disorder: Secondary | ICD-10-CM | POA: Diagnosis not present

## 2024-06-07 LAB — LIPID PANEL
Cholesterol: 195 mg/dL (ref 0–200)
HDL: 62 mg/dL (ref >40)
LDL Cholesterol: 111 mg/dL — ABNORMAL HIGH (ref 0–99)
Total CHOL/HDL Ratio: 3.2 ratio
Triglycerides: 109 mg/dL (ref <150)
VLDL: 22 mg/dL (ref 0–40)

## 2024-06-07 LAB — VITAMIN D 25 HYDROXY (VIT D DEFICIENCY, FRACTURES): Vit D, 25-Hydroxy: 10.58 ng/mL — ABNORMAL LOW (ref 30–100)

## 2024-06-07 LAB — HEMOGLOBIN A1C
Hgb A1c MFr Bld: 4.3 % — ABNORMAL LOW (ref 4.8–5.6)
Mean Plasma Glucose: 76.71 mg/dL

## 2024-06-07 MED ORDER — CHOLECALCIFEROL 10 MCG (400 UNIT) PO TABS
400.0000 [IU] | ORAL_TABLET | Freq: Every day | ORAL | Status: DC
  Administered 2024-06-07 – 2024-06-13 (×7): 400 [IU] via ORAL
  Filled 2024-06-07 (×7): qty 1

## 2024-06-07 NOTE — Group Note (Signed)
 Occupational Therapy Group Note  Group Topic:Coping Skills  Group Date: 06/07/2024 Start Time: 1500 End Time: 1533 Facilitators: Dot [place] MATSU, OT   Group Description: Group encouraged increased engagement and participation through discussion and activity focused on Coping Ahead. Patients were split up into teams and selected a card from a stack of positive coping strategies. Patients were instructed to act out/charade the coping skill for other peers to guess and receive points for their team. Discussion followed with a focus on identifying additional positive coping strategies and patients shared how they were going to cope ahead over the weekend while continuing hospitalization stay.  Therapeutic Goal(s): Identify positive vs negative coping strategies. Identify coping skills to be used during hospitalization vs coping skills outside of hospital/at home Increase participation in therapeutic group environment and promote engagement in treatment   Participation Level: Engaged   Participation Quality: Independent   Behavior: Appropriate   Speech/Thought Process: Relevant   Affect/Mood: Appropriate   Insight: Fair   Judgement: Fair      Modes of Intervention: Education  Patient Response to Interventions:  Attentive   Plan: Continue to engage patient in OT groups 2 - 3x/week.  06/07/2024  [place] MATSU Dot, OT  Adelle Zachar, OT

## 2024-06-07 NOTE — Plan of Care (Signed)
  Problem: Education: Goal: Mental status will improve Outcome: Progressing   Problem: Activity: Goal: Interest or engagement in activities will improve Outcome: Progressing   Problem: Coping: Goal: Ability to demonstrate self-control will improve Outcome: Progressing   Problem: Physical Regulation: Goal: Ability to maintain clinical measurements within normal limits will improve Outcome: Progressing

## 2024-06-07 NOTE — Group Note (Signed)
 LCSW Group Therapy Note   Group Date: 06/07/2024 Start Time: 1100 End Time: 1200   Participation:  patient was present and actively participated in the discussion.  Type of Therapy:  Group Therapy  Topic:  Shining from Within:  Confidence and Self-Love Journey  Objective:  To support participants in developing confidence and self-love through self-awareness, self-compassion, and practical skills that nurture personal growth.   Group Goals Encourage self-reflection and self-acceptance by identifying personal strengths and achievements. Teach skills to challenge negative self-talk and replace it with supportive, truthful self-talk. Foster resilience and self-worth through [employer], gratitude, and self-care practices.   Summary:  This group explores the connection between confidence and self-love by guiding participants through reflection, mindset shifts, and practical tools like affirmations, strength recognition, and goal-setting. Activities are designed to promote self-compassion, build emotional resilience, and normalize the slow, patient journey of inner growth.   Therapeutic Modalities Used: Cognitive Behavioral Therapy (CBT): Challenging and reframing unhelpful self-talk. Motivational Interviewing (MI): Encouraging small, achievable goals. Elements of Dialectical Behavioral Therapist (DBT):  Mindfulness and Self-Compassion: Promoting present-moment awareness and kindness toward self.   Rawleigh Rode O Pattricia Weiher, LCSWA 06/07/2024  12:17 PM

## 2024-06-07 NOTE — Progress Notes (Signed)
 [facility] Inpatient Psychiatry Progress Note  Date: 06/07/24 Patient: George [name] MRN: [id number]  Assessment and Plan: George [name] [facility]) is a 28 y.o. transgender male admitted for suicidal ideation in the context of multiple stressors, which include chronic homelessness, lack of financial resources, and recently starting probation. They have a psychiatric history that reportedly includes numerous psychiatric hospitalizations and diagnoses of DID, PTSD, ADHD, bipolar disorder, and borderline personality disorder. The patient's observed behaviors during this admission appear to suggest borderline personality disorder as the primary clinical issue.  10/16 - Patient remains irritable with subtle verbal hostility when interacting with staff. This appears predominantly due to pathologic character traits. Unclear if they actually have BPAD given the clear presence of BPD. Suspect some manipulative behavior and motivation to avoid homelessness. Unclear what their disposition will be given their lack of resources.    # Borderline Personality Disorder / Suicidal Ideation - Continue Fluoxetine  20 mg daily - Discontinue lurasidone. Patient insists that all medications within this class are ineffective and make them irritable. They are most likely to not take mood stabilizing agents after discharge.   Risk Assessment - High (active expressed suicidal intent)  Discharge Planning Barriers to discharge: Suicidal ideation Estimated length of stay: 5-7 days Predicted Discharge location: Homelessness ??     Interval History and update: Chart reviewed. No significant events overnight. Patient reported sleeping well last night. This morning he reports that he is feeling irritable on account of the [place], just like I told you so. He insists that any mood-stabilizing medications will make him irritable. He does not, however, provide any useful feedback regarding suggestions for  other medications, and gave a vague dismissive response when asked what symptoms he was looking to target. He stated that he is still suicidal and that he understands he will remain in the hospital so long as he continues to experience active SI. He nonchalantly stated that he has been hospitalized twenty times and that he knows the system. In discussing aftercare plans he states that he has no idea what he plans to do and that this is one of his primary stressors. He identifies them as having lack of money and housing, primarily. He states that he has been pursuing employment but has thus far been unsuccessful, and he remains currently homeless.       Physical Exam MSK/Neuro - Normal gait and station  Mental Status Exam Appearance - Casually dressed, appropriate hygiene and grooming Eye-contact - Poor Attitude - Standoffish, mildly condescending Speech - normal volume, prosody, inflection Mood - The same Affect - Restricted, irritable Thought Process - LLGD Thought Content - No delusional TC expressed SI/HI - Endorses ongoing active SI to lay down on a railroad track Perceptions - Denies AVH; not RIS Judgement/Insight - Poor Fund of knowledge - WNL Language - No impairments      Lab Results:  Admission on 06/05/2024  Component Date Value Ref Range Status   Cholesterol 06/07/2024 195  0 - 200 mg/dL Final   Triglycerides 89/83/7974 109  <150 mg/dL Final   HDL 89/83/7974 62  >40 mg/dL Final   Total CHOL/HDL Ratio 06/07/2024 3.2  RATIO Final   VLDL 06/07/2024 22  0 - 40 mg/dL Final   LDL Cholesterol 06/07/2024 111 (H)  0 - 99 mg/dL Final   Vit D, 74-Ybimnkb 06/07/2024 10.58 (L)  30 - 100 ng/mL Final   Hgb A1c MFr Bld 06/07/2024 4.3 (L)  4.8 - 5.6 % Final   Mean Plasma  Glucose 06/07/2024 76.71  mg/dL Final     Vitals: Blood pressure 116/74, pulse 76, temperature 98.4 F (36.9 C), temperature source Oral, resp. rate 16, height 5' 7 (1.702 m), weight 58.5 kg, SpO2 98%.    Oliva DELENA Salmon, DO

## 2024-06-07 NOTE — Progress Notes (Signed)
   06/07/24 0900  Psych Admission Type (Psych Patients Only)  Admission Status Voluntary  Psychosocial Assessment  Patient Complaints None  Eye Contact Fair  Facial Expression Animated  Affect Constricted  Speech Logical/coherent  Interaction Assertive  Motor Activity Fidgety  Appearance/Hygiene Unremarkable  Behavior Characteristics Cooperative;Calm  Mood Euthymic  Thought Process  Coherency WDL  Content WDL  Delusions None reported or observed  Perception WDL  Hallucination None reported or observed  Judgment Limited  Confusion None  Danger to Self  Current suicidal ideation? Denies  Agreement Not to Harm Self Yes  Description of Agreement verbal  Danger to Others  Danger to Others None reported or observed

## 2024-06-07 NOTE — Plan of Care (Signed)
   Problem: Education: Goal: Emotional status will improve Outcome: Progressing Goal: Mental status will improve Outcome: Progressing Goal: Verbalization of understanding the information provided will improve Outcome: Progressing

## 2024-06-07 NOTE — Progress Notes (Signed)
 Adult Psychoeducational Group Note  Date:  06/07/2024 Time:  10:34 AM  Group Topic/Focus:  Goals Group:   The focus of this group is to help patients establish daily goals to achieve during treatment and discuss how the patient can incorporate goal setting into their daily lives to aide in recovery.  Participation Level:  Active  Participation Quality:  Appropriate  Affect:  Appropriate  Cognitive:  Appropriate  Insight: Appropriate  Engagement in Group:  Engaged  Modes of Intervention:  Discussion  Additional Comments:  Pt stated his goal for the day is to control his sucidal ideation.  [name] D 06/07/2024, 10:34 AM

## 2024-06-07 NOTE — Progress Notes (Signed)
 Patient resting quietly in bed with eyes closed, Respirations equal and unlabored, skin warm and dry, NAD. Routine safety checks conducted according to facility protocol. Will continue to monitor for safety.

## 2024-06-07 NOTE — Group Note (Signed)
 Date:  06/07/2024 Time:  8:52 PM  Group Topic/Focus:  Wrap-Up Group:   The focus of this group is to help patients review their daily goal of treatment and discuss progress on daily workbooks.    Participation Level:  Active  Participation Quality:  Appropriate  Affect:  Appropriate  Cognitive:  Appropriate  Insight: Appropriate  Engagement in Group:  Engaged  Modes of Intervention:  Discussion  Additional Comments:  Patient stated she did not know what kind of day it was.  Bari Moats 06/07/2024, 8:52 PM

## 2024-06-08 DIAGNOSIS — F603 Borderline personality disorder: Secondary | ICD-10-CM | POA: Diagnosis not present

## 2024-06-08 DIAGNOSIS — R45851 Suicidal ideations: Secondary | ICD-10-CM | POA: Diagnosis not present

## 2024-06-08 NOTE — Progress Notes (Signed)
(  Sleep Hours) - 8.25 (Any PRNs that were needed, meds refused, or side effects to meds)- None (Any disturbances and when (visitation, over night)- None (Concerns raised by the patient)- None (SI/HI/AVH)- Denies

## 2024-06-08 NOTE — Group Note (Signed)
 Date:  06/08/2024 Time:  4:19 PM  Group Topic/Focus: Identifying resources and support systems Crisis Planning:   The purpose of this group is to help patients create a crisis plan for use upon discharge or in the future, as needed.    Participation Level:  Active  Participation Quality:  Appropriate  Affect:  Appropriate  Cognitive:  Appropriate  Insight: Appropriate  Engagement in Group:  Engaged  Modes of Intervention:  Discussion  Additional Comments:  Pt engaged appropriately in group.  Lesley Atkin D Domnique Vantine 06/08/2024, 4:19 PM

## 2024-06-08 NOTE — Plan of Care (Signed)
  Problem: Activity: Goal: Interest or engagement in activities will improve Outcome: Progressing Goal: Sleeping patterns will improve Outcome: Progressing   Problem: Coping: Goal: Ability to verbalize frustrations and anger appropriately will improve Outcome: Progressing Goal: Ability to demonstrate self-control will improve Outcome: Progressing   Problem: Safety: Goal: Periods of time without injury will increase Outcome: Progressing

## 2024-06-08 NOTE — BH Assessment (Signed)
(  Sleep Hours) - 8.25 (Any PRNs that were needed, meds refused, or side effects to meds)-  (Any disturbances and when (visitation, over night)- (Concerns raised by the patient)- Very liable and intrusive (SI/HI/AVH)- Denies

## 2024-06-08 NOTE — Plan of Care (Signed)
   Problem: Education: Goal: Verbalization of understanding the information provided will improve Outcome: Progressing   Problem: Activity: Goal: Interest or engagement in activities will improve Outcome: Progressing Goal: Sleeping patterns will improve Outcome: Progressing

## 2024-06-08 NOTE — Plan of Care (Signed)
   Problem: Education: Goal: Emotional status will improve Outcome: Progressing Goal: Mental status will improve Outcome: Progressing Goal: Verbalization of understanding the information provided will improve Outcome: Progressing

## 2024-06-08 NOTE — Progress Notes (Signed)
   06/08/24 0915  Psych Admission Type (Psych Patients Only)  Admission Status Voluntary  Psychosocial Assessment  Patient Complaints None  Eye Contact Fair  Facial Expression Animated  Affect Appropriate to circumstance  Speech Logical/coherent  Interaction Assertive  Motor Activity Fidgety  Appearance/Hygiene Unremarkable  Behavior Characteristics Cooperative  Mood Pleasant;Euthymic  Thought Process  Coherency WDL  Content WDL  Delusions None reported or observed  Perception WDL  Hallucination None reported or observed  Judgment Limited  Confusion None  Danger to Self  Current suicidal ideation? Denies  Agreement Not to Harm Self Yes  Description of Agreement Verbal  Danger to Others  Danger to Others None reported or observed

## 2024-06-08 NOTE — Progress Notes (Signed)
 [facility] Inpatient Psychiatry Progress Note  Date: 06/08/24 Patient: George [name] MRN: [id number]  Assessment and Plan: Ozell Schools [facility]) is a 28 y.o. transgender male admitted for suicidal ideation in the context of multiple stressors, which include chronic homelessness, lack of financial resources, and recently starting probation. They have a psychiatric history that reportedly includes numerous psychiatric hospitalizations and diagnoses of DID, PTSD, ADHD, bipolar disorder, and borderline personality disorder. The patient's observed behaviors during this admission appear to suggest borderline personality disorder as the primary clinical issue.  10/17 - Patient reports a better mood without lurasidone but still endorses a dysthymic mood and suicidal ideation.    # Borderline Personality Disorder / Suicidal Ideation - Continue Fluoxetine  20 mg daily   Risk Assessment - High (active expressed suicidal intent)  Discharge Planning Barriers to discharge: Suicidal ideation Estimated length of stay: 5-7 days Predicted Discharge location: Homelessness ??     Interval History and update: Chart reviewed. No significant events overnight. Patient reported sleeping well last night and stated that she felt less irritable today now that lurasidone was discontinued. They stated that their mood was back to normal and described it as dysthymic, but not depressed. They endorsed less irritability but continue to endorse thoughts of lying down on a railroad track due to all the stressors they face (financial, housing, legal, etc.). They have been visible in the milieu and generally participating in groups. No grossly inappropriate behaviors today.       Physical Exam MSK/Neuro - Normal gait and station  Mental Status Exam Appearance - Casually dressed, appropriate hygiene and grooming Eye-contact - moderate Attitude - Aloof, but not condescending Speech - normal  volume, prosody, inflection Mood - The way I usually do Affect - Full Thought Process - LLGD Thought Content - No delusional TC expressed SI/HI - Endorses ongoing active SI to lay down on a railroad track Perceptions - Denies AVH; not RIS Judgement/Insight - Poor Fund of knowledge - WNL Language - No impairments      Lab Results:  Admission on 06/05/2024  Component Date Value Ref Range Status   Cholesterol 06/07/2024 195  0 - 200 mg/dL Final   Triglycerides 89/83/7974 109  <150 mg/dL Final   HDL 89/83/7974 62  >40 mg/dL Final   Total CHOL/HDL Ratio 06/07/2024 3.2  RATIO Final   VLDL 06/07/2024 22  0 - 40 mg/dL Final   LDL Cholesterol 06/07/2024 111 (H)  0 - 99 mg/dL Final   Vit D, 74-Ybimnkb 06/07/2024 10.58 (L)  30 - 100 ng/mL Final   Hgb A1c MFr Bld 06/07/2024 4.3 (L)  4.8 - 5.6 % Final   Mean Plasma Glucose 06/07/2024 76.71  mg/dL Final     Vitals: Blood pressure 122/86, pulse 75, temperature 98.4 F (36.9 C), temperature source Oral, resp. rate 16, height 5' 7 (1.702 m), weight 58.5 kg, SpO2 98%.    Oliva DELENA Salmon, DO

## 2024-06-08 NOTE — Group Note (Signed)
 Recreation Therapy Group Note   Group Topic:Health and Wellness  Group Date: 06/08/2024 Start Time: 0940 End Time: 1005 Facilitators: Jimmey Hengel-McCall, LRT,CTRS Location: [address] Dayroom   Group Topic: Wellness  Goal Area(s) Addresses:  Patient will define components of whole wellness. Patient will verbalize benefit of whole wellness.  Behavioral Response: Engaged  Intervention: Music  Activity: Exercise. Patients took turns leading group in the exercises of their choosing. Patients determined the difficulty of the exercises presented in group. Patients were encouraged not to do any exercise that was to difficult or aggravated any previous injuries. Patients were also encouraged to take breaks or get water as needed.     Education: Wellness, Building control surveyor.   Education Outcome: Acknowledges education/In group clarification offered/Needs additional education.    Affect/Mood: Appropriate   Participation Level: Engaged   Participation Quality: Independent   Behavior: Appropriate   Speech/Thought Process: Focused   Insight: Good   Judgement: Good   Modes of Intervention: Music   Patient Response to Interventions:  Engaged   Education Outcome:  In group clarification offered    Clinical Observations/Individualized Feedback: Pt was bright and engaged during group. Pt was social and interactive throughout. Pt led group in calf raises and push ups. Pt was able to complete all the exercises.      Plan: Continue to engage patient in RT group sessions 2-3x/week.   Aiysha Jillson-McCall, LRT,CTRS 06/08/2024 11:44 AM

## 2024-06-08 NOTE — Progress Notes (Signed)
Pt attended AA group and actively participated.  

## 2024-06-08 NOTE — Group Note (Signed)
 Date:  06/08/2024 Time:  9:55 AM  Group Topic/Focus:  Goals Group:   The focus of this group is to help patients establish daily goals to achieve during treatment and discuss how the patient can incorporate goal setting into their daily lives to aide in recovery.    Participation Level:  Active  Participation Quality:  Appropriate  Affect:  Appropriate  Cognitive:  Alert  Insight: Appropriate  Engagement in Group:  Engaged  Modes of Intervention:  Orientation  Additional Comments:  she wants to be able to stop thinking about SI. Feels like some of the things she has done isnt working.  Earnesteen Birnie M Devinn Voshell 06/08/2024, 9:55 AM

## 2024-06-09 DIAGNOSIS — R45851 Suicidal ideations: Secondary | ICD-10-CM | POA: Diagnosis not present

## 2024-06-09 DIAGNOSIS — F603 Borderline personality disorder: Secondary | ICD-10-CM | POA: Diagnosis not present

## 2024-06-09 NOTE — Plan of Care (Signed)
  Problem: Activity: Goal: Interest or engagement in activities will improve Outcome: Progressing Goal: Sleeping patterns will improve Outcome: Progressing   Problem: Coping: Goal: Ability to verbalize frustrations and anger appropriately will improve Outcome: Progressing Goal: Ability to demonstrate self-control will improve Outcome: Progressing   Problem: Safety: Goal: Periods of time without injury will increase Outcome: Progressing

## 2024-06-09 NOTE — Group Note (Signed)
 Date:  06/09/2024 Time:  8:56 AM  Group Topic/Focus:  Goals Group:   The focus of this group is to help patients establish daily goals to achieve during treatment and discuss how the patient can incorporate goal setting into their daily lives to aide in recovery. Orientation:   The focus of this group is to educate the patient on the purpose and policies of crisis stabilization and provide a format to answer questions about their admission.  The group details unit policies and expectations of patients while admitted.    Participation Level:  Active  Participation Quality:  Redirectable  Affect:  Appropriate  Cognitive:  Appropriate  Insight: Good  Engagement in Group:  Distracting and Off Topic  Modes of Intervention:  Orientation  Additional Comments:  goal is to not have any s/I thoughts  George [name] 06/09/2024, 8:56 AM

## 2024-06-09 NOTE — Progress Notes (Signed)
 [facility] Inpatient Psychiatry Progress Note  Date: 06/09/24 Patient: George [name] MRN: [id number]  Assessment and Plan: George [name]) is a 28 y.o. transgender male admitted for suicidal ideation in the context of multiple stressors, which include chronic homelessness, lack of financial resources, and recently starting probation. George [name] have a psychiatric history that reportedly includes numerous psychiatric hospitalizations and diagnoses of DID, PTSD, ADHD, bipolar disorder, and borderline personality disorder. The patient's observed behaviors during this admission appear to suggest borderline personality disorder as the primary clinical issue.  10/18 - No significant changes. Patient continues to report suicidal ideation. Their expressed suicidal ideation is likely related to homelessness.     # Borderline Personality Disorder / Suicidal Ideation - Continue Fluoxetine  20 mg daily   Risk Assessment - High (active expressed suicidal intent)  Discharge Planning Barriers to discharge: Suicidal ideation Estimated length of stay: 5-7 days Predicted Discharge location: Homelessness ??     Interval History and update: Chart reviewed. No significant events overnight. Patient appears unchanged from yesterday. George [name] have been visible and active in the milieu. George [name] report their mood as okay but still suicidal because their underlying stressors have not changed. George [name] share that George [name] have been enjoying socializing with other patients and that I've been helping them more than I've been helping myself. George [name] report ongoing suicidal ideation with thoughts and intent to lie down on railroad tracks when discharged if faced with returning to homelessness.       Physical Exam MSK/Neuro - Normal gait and station  Mental Status Exam Appearance - Casually dressed, appropriate hygiene and grooming Eye-contact - moderate Attitude - Aloof, mildly disinterested Speech -  normal volume, prosody, inflection Mood - still suicidal Affect - Full Thought Process - LLGD Thought Content - No delusional TC expressed SI/HI - Endorses ongoing thoughts to lay down on a railroad track Perceptions - Denies AVH; not RIS Judgement/Insight - Poor Fund of knowledge - WNL Language - No impairments      Lab Results:  Admission on 06/05/2024  Component Date Value Ref Range Status   Cholesterol 06/07/2024 195  0 - 200 mg/dL Final   Triglycerides 89/83/7974 109  <150 mg/dL Final   HDL 89/83/7974 62  >40 mg/dL Final   Total CHOL/HDL Ratio 06/07/2024 3.2  RATIO Final   VLDL 06/07/2024 22  0 - 40 mg/dL Final   LDL Cholesterol 06/07/2024 111 (H)  0 - 99 mg/dL Final   Vit D, 74-Ybimnkb 06/07/2024 10.58 (L)  30 - 100 ng/mL Final   Hgb A1c MFr Bld 06/07/2024 4.3 (L)  4.8 - 5.6 % Final   Mean Plasma Glucose 06/07/2024 76.71  mg/dL Final     Vitals: Blood pressure 107/86, pulse 81, temperature 98.4 F (36.9 C), temperature source Oral, resp. rate 16, height 5' 7 (1.702 m), weight 58.5 kg, SpO2 96%.    Oliva DELENA Salmon, DO

## 2024-06-09 NOTE — Progress Notes (Signed)
   06/09/24 0915  Psych Admission Type (Psych Patients Only)  Admission Status Voluntary  Psychosocial Assessment  Patient Complaints Anxiety;Depression  Eye Contact Fair  Facial Expression Animated  Affect Appropriate to circumstance  Speech Logical/coherent  Interaction Assertive  Motor Activity Fidgety  Appearance/Hygiene Unremarkable  Behavior Characteristics Cooperative  Mood Pleasant;Anxious;Depressed  Thought Process  Coherency WDL  Content WDL  Delusions None reported or observed  Perception WDL  Hallucination None reported or observed  Judgment Limited  Confusion None  Danger to Self  Current suicidal ideation? Denies  Agreement Not to Harm Self Yes  Description of Agreement Verbal  Danger to Others  Danger to Others None reported or observed

## 2024-06-09 NOTE — Group Note (Signed)
 Date:  06/09/2024 Time:  6:29 PM  Group Topic/Focus:  Making Healthy Choices:   The focus of this group is to help patients identify negative/unhealthy choices they were using prior to admission and identify positive/healthier coping strategies to replace them upon discharge. Self Care:   The focus of this group is to help patients understand the importance of self-care in order to improve or restore emotional, physical, spiritual, interpersonal, and financial health. Gut health: Teach about the microbiome of our intestines and how it impacts our physical and mental health. How to help it help us .    Participation Level:  Active  Participation Quality:  Appropriate  Affect:  Appropriate  Cognitive:  Appropriate  Insight: Limited  Engagement in Group:  Developing/Improving  Modes of Intervention:  Discussion and Education  Additional Comments:  Appropriate interaction  Juliene CHRISTELLA Huddle 06/09/2024, 6:29 PM

## 2024-06-09 NOTE — Group Note (Signed)
 [facility] LCSW Group Therapy Note  06/09/2024  10:00-11:00AM  Type of Therapy and Topic:  Group Therapy:  Building Supports and Asking for Help  Participation Level:  Active   Description of Group:  Patients in this group wished to discuss whether it is a weakness to ask for help.  Processing of this question and related matters took place while group facilitator ensured that all patients had an opportunity to speak, responses were on topic, and the direction of the discussion was positive in nature.    Therapeutic Goals: 1)  discuss why asking for help can be a strength rather than a weakness  2)  explore individual patients' experience in asking for assistance  3)  encourage mutual support among group members  4)  demonstrate the importance of adding supports and of setting boundaries   Summary of Patient Progress:  The patient expressed full comprehension of the concepts presented.  Patient was somewhat monopolizing at times but could be redirected.    Therapeutic Modalities:   Processing Demonstration  Elgie JINNY Crest, LCSW 06/09/2024 1:13 PM

## 2024-06-09 NOTE — Progress Notes (Signed)
 Adult Psychoeducational Group Note  Date:  06/09/2024 Time:  9:06 PM  Group Topic/Focus:  Crisis Planning:   The purpose of this group is to help patients create a crisis plan for use upon discharge or in the future, as needed. Wrap-Up Group:   The focus of this group is to help patients review their daily goal of treatment and discuss progress on daily workbooks.  Participation Level:  Active  Participation Quality:  Appropriate  Affect:  Appropriate  Cognitive:  Appropriate  Insight: Appropriate  Engagement in Group:  Engaged  Modes of Intervention:  Discussion  Additional Comments:  Pt stated her goal for the day was to have no suicidal ideations. Pt did not meet goal.  [name] D 06/09/2024, 9:06 PM

## 2024-06-10 DIAGNOSIS — F603 Borderline personality disorder: Secondary | ICD-10-CM | POA: Diagnosis not present

## 2024-06-10 DIAGNOSIS — R45851 Suicidal ideations: Secondary | ICD-10-CM | POA: Diagnosis not present

## 2024-06-10 MED ORDER — ESTRADIOL 1 MG PO TABS
2.0000 mg | ORAL_TABLET | Freq: Every day | ORAL | Status: DC
  Administered 2024-06-10 – 2024-06-12 (×3): 2 mg via ORAL
  Filled 2024-06-10 (×3): qty 2

## 2024-06-10 NOTE — Progress Notes (Signed)
 [facility] Inpatient Psychiatry Progress Note  Date: 06/10/24 Patient: George [name] MRN: [id number]  Assessment and Plan: George [name]) is a 28 y.o. transgender male admitted for suicidal ideation in the context of multiple stressors, which include chronic homelessness, lack of financial resources, and recently starting probation. They have a psychiatric history that reportedly includes numerous psychiatric hospitalizations and diagnoses of DID, PTSD, ADHD, bipolar disorder, and borderline personality disorder. The patient's observed behaviors during this admission appear to suggest borderline personality disorder as the primary clinical issue.  10/19 - No clear changes. Patient likely appropriate for discharge to [facility] or another shelter after the weekend. They have been observed in the hospital for five days now and exhibit a clear discrepancy between their stated mood and intensity of suicidal ideation and observed affect and behavior. They have been energetic and bright with other patients and not withdrawn. They continue to cite homelessness and other psychosocial stressors as the driver for suicidal ideation, and they note a history of multiple psychiatric admissions and knowledge that expression of suicidal ideation can lead to a prolonged hospitalization. Given that the only practical intervention the hospital can accomplish to address lack of shelter is to send them to the [facility] or similar shelter there is unclear benefit from further hospitalization. The patient's presentation suggests that borderline character pathology plays a prominent role in their current symptoms. While some of these symptoms may improve with mood stabilizing agents the patient has expressed a preference to not take them, and medication is generally unlikely to resolve suicidal ideation. Also considering that there is likely a component of secondary gain discharge is recommended Monday or Tuesday  if no further interventions are identified.    # Borderline Personality Disorder / Suicidal Ideation - Continue Fluoxetine  20 mg daily   Risk Assessment - Intermediate (recent expressed suicidal intent)  Discharge Planning Barriers to discharge: Suicidal ideation Estimated length of stay: 2-4 days Predicted Discharge location: Homelessness/IRC     Interval History and update: Chart reviewed. No significant events overnight. Patient continues to be visible and active in the milieu. No significant behavioral events. Patient reported experiencing passive suicidal ideation without intent today, but still has thoughts of going down to the train tracks. They report that their mood is depressed and the same as yesterday, though they report feeling better no being on lurasidone. They continue to have no significant plan in terms of after discharge, and would like to discuss options further with case management. They otherwise have no significant complaints or concerns.       Physical Exam MSK/Neuro - Normal gait and station  Mental Status Exam Appearance - Casually dressed, appropriate hygiene and grooming Eye-contact - moderate Attitude - Aloof, mildly disinterested Speech - normal volume, prosody, inflection Mood - the same Affect - Full Thought Process - LLGD Thought Content - No delusional TC expressed SI/HI - passive thoughts as above Perceptions - Denies AVH; not RIS Judgement/Insight - Poor Fund of knowledge - WNL Language - No impairments      Lab Results:  Admission on 06/05/2024  Component Date Value Ref Range Status   Cholesterol 06/07/2024 195  0 - 200 mg/dL Final   Triglycerides 89/83/7974 109  <150 mg/dL Final   HDL 89/83/7974 62  >40 mg/dL Final   Total CHOL/HDL Ratio 06/07/2024 3.2  RATIO Final   VLDL 06/07/2024 22  0 - 40 mg/dL Final   LDL Cholesterol 06/07/2024 111 (H)  0 - 99 mg/dL Final  Vit D, 25-Hydroxy 06/07/2024 10.58 (L)  30 - 100 ng/mL Final    Hgb A1c MFr Bld 06/07/2024 4.3 (L)  4.8 - 5.6 % Final   Mean Plasma Glucose 06/07/2024 76.71  mg/dL Final     Vitals: Blood pressure 116/87, pulse 65, temperature 97.6 F (36.4 C), temperature source Oral, resp. rate 16, height 5' 7 (1.702 m), weight 58.5 kg, SpO2 97%.    Oliva DELENA Salmon, DO

## 2024-06-10 NOTE — Plan of Care (Signed)
   Problem: Education: Goal: Verbalization of understanding the information provided will improve Outcome: Progressing   Problem: Activity: Goal: Interest or engagement in activities will improve Outcome: Progressing

## 2024-06-10 NOTE — Group Note (Signed)
 Date:  06/10/2024 Time:  6:39 PM  Group Topic/Focus:  Making Healthy Choices:   The focus of this group is to help patients identify negative/unhealthy choices they were using prior to admission and identify positive/healthier coping strategies to replace them upon discharge. Relapse Prevention Planning:   The focus of this group is to define relapse and discuss the need for planning to combat relapse.  Watched video of addiction behavior and it's triggers and possible solutions. Gave handouts with description of video and thought provoking questons. Personal sharing and audience sharing.   Participation Level:  Minimal  Participation Quality:  Drowsy  Affect:  Lethargic  Cognitive:  Lacking  Insight: Lacking  Engagement in Group:  Lacking  Modes of Intervention:  Discussion and Education  Additional Comments:  Slept through most of discussion.  Juliene CHRISTELLA Huddle 06/10/2024, 6:39 PM

## 2024-06-10 NOTE — Group Note (Signed)
 Date:  06/10/2024 Time:  8:50 PM  Group Topic/Focus:  Wrap-Up Group:   The focus of this group is to help patients review their daily goal of treatment and discuss progress on daily workbooks.    Participation Level:  Active  Participation Quality:  Intrusive and Redirectable  Affect:  Defensive, Excited, and Resistant  Cognitive:  Appropriate  Insight: Good  Engagement in Group:  Distracting, Monopolizing, and Resistant  Modes of Intervention:  Discussion  Additional Comments:   Pt states their day was okay, pt has constant SI but contracts for safety, pt states their hearing their DID alters. Pt states that they want to be better at managing their thoughts of SI  Quanda Pavlicek A Pahola Dimmitt 06/10/2024, 8:50 PM

## 2024-06-10 NOTE — Group Note (Signed)
 Date:  06/10/2024 Time:  12:55 PM  Group Topic/Focus:  Emotional Education:  The group explored themes of acceptance, change, resilience, and control through song analysis and a lyric substitution activity. The goal of the group is to increase self awareness, emotional expression, autonomy, and self-esteem.   Participation Level:  Active  Participation Quality:  Appropriate  Affect:  Appropriate  Cognitive:  Appropriate  Insight: Appropriate  Engagement in Group:  Developing/Improving  Modes of Intervention:  Activity and Discussion  Additional Comments:    Taquita Demby D Kenson Groh 06/10/2024, 12:55 PM

## 2024-06-10 NOTE — Progress Notes (Signed)
   06/09/24 2055  Psych Admission Type (Psych Patients Only)  Admission Status Voluntary  Psychosocial Assessment  Patient Complaints Anxiety;Depression  Eye Contact Fair  Facial Expression Animated  Affect Appropriate to circumstance  Speech Logical/coherent  Interaction Assertive  Motor Activity Fidgety  Appearance/Hygiene Unremarkable  Behavior Characteristics Cooperative  Mood Pleasant  Thought Process  Coherency WDL  Content WDL  Delusions None reported or observed  Perception WDL  Hallucination None reported or observed  Judgment Limited  Confusion None  Danger to Self  Current suicidal ideation? Passive  Description of Suicide Plan no plan  Self-Injurious Behavior Some self-injurious ideation observed or expressed.  No lethal plan expressed   Agreement Not to Harm Self Yes  Description of Agreement Verbal  Danger to Others  Danger to Others None reported or observed

## 2024-06-10 NOTE — Progress Notes (Signed)
   06/10/24 1300  Psych Admission Type (Psych Patients Only)  Admission Status Voluntary  Psychosocial Assessment  Patient Complaints Anxiety;Depression  Eye Contact Fair  Facial Expression Animated  Affect Appropriate to circumstance  Speech Logical/coherent  Interaction Assertive  Motor Activity Fidgety  Appearance/Hygiene Unremarkable  Behavior Characteristics Cooperative  Mood Pleasant  Thought Process  Coherency WDL  Content WDL  Delusions WDL;None reported or observed  Perception WDL  Hallucination None reported or observed  Judgment Limited  Confusion None  Danger to Self  Current suicidal ideation? Passive  Description of Suicide Plan No Plan  Self-Injurious Behavior Self-injurious ideation with potentially lethal plan observed or expressed  Agreement Not to Harm Self Yes  Description of Agreement Verbal  Danger to Others  Danger to Others None reported or observed

## 2024-06-10 NOTE — Plan of Care (Signed)
   Problem: Education: Goal: Emotional status will improve Outcome: Progressing Goal: Mental status will improve Outcome: Progressing Goal: Verbalization of understanding the information provided will improve Outcome: Progressing   Problem: Activity: Goal: Interest or engagement in activities will improve Outcome: Progressing

## 2024-06-10 NOTE — Progress Notes (Signed)
(  Sleep Hours) - 7.25 (Any PRNs that were needed, meds refused, or side effects to meds)- No PRN medications given, no meds refused.  (Any disturbances and when (visitation, over night)- None  (Concerns raised by the patient)- None  (SI/HI/AVH)- Denies HI/AVH. Endorses passive SI with no plan; verbally contracts for safety.

## 2024-06-10 NOTE — Progress Notes (Signed)
   06/10/24 1921  Psych Admission Type (Psych Patients Only)  Admission Status Voluntary  Psychosocial Assessment  Patient Complaints Anxiety;Depression  Eye Contact Fair  Facial Expression Animated  Affect Appropriate to circumstance  Speech Logical/coherent  Interaction Assertive  Motor Activity Fidgety  Appearance/Hygiene Unremarkable  Behavior Characteristics Cooperative  Mood Pleasant  Thought Process  Coherency WDL  Content WDL  Delusions None reported or observed  Perception WDL  Hallucination None reported or observed  Judgment Limited  Confusion None  Danger to Self  Current suicidal ideation? Passive  Description of Suicide Plan No Plan  Self-Injurious Behavior Self-injurious ideation with potentially lethal plan observed or expressed  Agreement Not to Harm Self Yes  Description of Agreement Verbal  Danger to Others  Danger to Others None reported or observed

## 2024-06-10 NOTE — Group Note (Signed)
 Date:  06/10/2024 Time:  9:23 AM  Group Topic/Focus:  Goals Group:   The focus of this group is to help patients establish daily goals to achieve during treatment and discuss how the patient can incorporate goal setting into their daily lives to aide in recovery. Orientation:   The focus of this group is to educate the patient on the purpose and policies of crisis stabilization and provide a format to answer questions about their admission.  The group details unit policies and expectations of patients while admitted.    Participation Level:  Active  Participation Quality:  Appropriate  Affect:  Appropriate  Cognitive:  Appropriate  Insight: Appropriate  Engagement in Group:  Engaged  Modes of Intervention:  Discussion and Orientation  Additional Comments:    George [name] D Dao Memmott 06/10/2024, 9:23 AM

## 2024-06-11 ENCOUNTER — Encounter (HOSPITAL_COMMUNITY): Payer: Self-pay

## 2024-06-11 DIAGNOSIS — R45851 Suicidal ideations: Secondary | ICD-10-CM | POA: Diagnosis not present

## 2024-06-11 DIAGNOSIS — F603 Borderline personality disorder: Secondary | ICD-10-CM | POA: Diagnosis not present

## 2024-06-11 NOTE — Group Note (Deleted)
 Date:  06/11/2024 Time:  8:08 PM  Group Topic/Focus:        Participation Level:  {[facility] PARTICIPATION OZCZO:77735}  Participation Quality:  {[facility] PARTICIPATION QUALITY:22265}  Affect:  {[facility] AFFECT:22266}  Cognitive:  {[facility] COGNITIVE:22267}  Insight: {[facility] Insight2:20797}  Engagement in Group:  {[facility] ENGAGEMENT IN HMNLE:77731}  Modes of Intervention:  {[facility] MODES OF INTERVENTION:22269}  Additional Comments:  ***  George [name] George [name] 06/11/2024, 8:08 PM

## 2024-06-11 NOTE — Progress Notes (Signed)
 Spiritual care group on grief and loss facilitated by Chaplain Rockie Sofia, [facility]  Group Goal: Support / Education around grief and loss  Members engage in facilitated group support and psycho-social education.  Group Description:  Following introductions and group rules, group members engaged in facilitated group dialogue and support around topic of loss, with particular support around experiences of loss in their lives. Group Identified types of loss (relationships / self / things) and identified patterns, circumstances, and changes that precipitate losses. Reflected on thoughts / feelings around loss, normalized grief responses, and recognized variety in grief experience. Group encouraged individual reflection on safe space and on the coping skills that they are already utilizing.  Group drew on Adlerian / Rogerian and narrative framework  Patient Progress: [name] attended group, but shortly after group, fell asleep.

## 2024-06-11 NOTE — Group Note (Signed)
 Occupational Therapy Group Note  Group Topic: Sleep Hygiene  Group Date: 06/11/2024 Start Time: 1500 End Time: 1538 Facilitators: Dot Dallas MATSU, OT   Group Description: Group encouraged increased participation and engagement through topic focused on sleep hygiene. Patients reflected on the quality of sleep they typically receive and identified areas that need improvement. Group was given background information on sleep and sleep hygiene, including common sleep disorders. Group members also received information on how to improve one's sleep and introduced a sleep diary as a tool that can be utilized to track sleep quality over a length of time. Group session ended with patients identifying one or more strategies they could utilize or implement into their sleep routine in order to improve overall sleep quality.        Therapeutic Goal(s):  Identify one or more strategies to improve overall sleep hygiene  Identify one or more areas of sleep that are negatively impacted (sleep too much, too little, etc)     Participation Level: Engaged   Participation Quality: Independent   Behavior: Appropriate   Speech/Thought Process: Relevant   Affect/Mood: Appropriate   Insight: Fair   Judgement: Fair      Modes of Intervention: Education  Patient Response to Interventions:  Attentive   Plan: Continue to engage patient in OT groups 2 - 3x/week.  06/11/2024  [place] MATSU Dot, OT   Amber Guthridge, OT

## 2024-06-11 NOTE — Group Note (Signed)
 Date:  06/11/2024 Time:  1:26 PM  Group Topic/Focus:  Chaplin Group    Participation Level:  Active  Additional Comments:  attended group  [employer] 06/11/2024, 1:26 PM

## 2024-06-11 NOTE — Group Note (Signed)
 Recreation Therapy Group Note   Group Topic:Problem Solving  Group Date: 06/11/2024 Start Time: 0941 End Time: 1002 Facilitators: Tatjana Turcott-McCall, LRT,CTRS Location: [address] Dayroom   Group Topic: Communication, Team Building, Problem Solving  Goal Area(s) Addresses:  Patient will effectively work with peer towards shared goal.  Patient will identify skills used to make activity successful.  Patient will identify how skills used during activity can be used to reach post d/c goals.   Behavioral Response: Engaged  Intervention: STEM Activity  Activity: Straw Bridge. In teams of 3-5, patients were given 15 plastic drinking straws and an equal length of masking tape. Using the materials provided, patients were instructed to build a free standing bridge-like structure to suspend an everyday item (ex: puzzle box) off of the floor or table surface. All materials were required to be used by the team in their design. LRT facilitated post-activity discussion reviewing team process. Patients were encouraged to reflect how the skills used in this activity can be generalized to daily life post discharge.   Education: Pharmacist, community, Scientist, physiological, Discharge Planning   Education Outcome: Acknowledges education/In group clarification offered/Needs additional education.    Affect/Mood: Appropriate   Participation Level: Engaged   Participation Quality: Independent   Behavior: Appropriate   Speech/Thought Process: Focused   Insight: Good   Judgement: Good   Modes of Intervention: STEM Activity   Patient Response to Interventions:  Engaged   Education Outcome:  In group clarification offered    Clinical Observations/Individualized Feedback: Pt was bright and engaged throughout group. Pt offered suggestions as peers constructed their bridge.    Plan: Continue to engage patient in RT group sessions 2-3x/week.   Savayah Waltrip-McCall, LRT,CTRS 06/11/2024 10:33 AM

## 2024-06-11 NOTE — Group Note (Signed)
 Date:  06/11/2024 Time:  3:35 PM  Group Topic/Focus: Occupational Therapy Group  Occupational therapy    Participation Level:  Active  Participation Quality:  Monopolizing  Affect:  Excited  Cognitive:  Alert  Insight: Appropriate  Engagement in Group:  Engaged  Modes of Intervention:  Discussion  Additional Comments:  Patient attended group. Occupational therapist reported that patient was monopolizing of time and required a lot of redirection.   Marianita Botkin D Vontae Court 06/11/2024, 3:35 PM

## 2024-06-11 NOTE — Plan of Care (Signed)
  Problem: Activity: Goal: Sleeping patterns will improve Outcome: Progressing   

## 2024-06-11 NOTE — Progress Notes (Signed)
 D: Patient is alert, oriented, pleasant, and cooperative. Patient endorses SI with a plan to lay on the train tracks. Denies HI, AVH, and verbally contracts for safety. Patient reports she slept good last night without sleeping medication. Patient reports her appetite as good, energy level as high, and concentration as good. Patient rates her depression 0/10, hopelessness 0/10, and anxiety 0/10. Patient denies physical symptoms/pain.    A: Scheduled medications administered per MD order. Support provided. Patient educated on safety on the unit and medications. Routine safety checks every 15 minutes. Patient stated understanding to tell nurse about any new physical symptoms. Patient understands to tell staff of any needs.     R: No adverse drug reactions noted. Patient remains safe at this time and will continue to monitor.    06/11/24 0900  Psych Admission Type (Psych Patients Only)  Admission Status Voluntary  Psychosocial Assessment  Patient Complaints Anxiety;Depression  Eye Contact Fair  Facial Expression Animated  Affect Appropriate to circumstance  Speech Logical/coherent  Interaction Assertive  Motor Activity Fidgety  Appearance/Hygiene Unremarkable  Behavior Characteristics Cooperative  Mood Pleasant  Thought Process  Coherency WDL  Content WDL  Delusions None reported or observed  Perception WDL  Hallucination None reported or observed  Judgment Limited  Confusion None  Danger to Self  Current suicidal ideation? Passive  Description of Suicide Plan to lay on train tracks  Self-Injurious Behavior Self-injurious ideation verbalized  Agreement Not to Harm Self Yes  Description of Agreement verbal  Danger to Others  Danger to Others None reported or observed

## 2024-06-11 NOTE — Plan of Care (Signed)
   Problem: Education: Goal: Emotional status will improve Outcome: Progressing Goal: Mental status will improve Outcome: Progressing Goal: Verbalization of understanding the information provided will improve Outcome: Progressing   Problem: Activity: Goal: Interest or engagement in activities will improve Outcome: Progressing   Problem: Coping: Goal: Ability to demonstrate self-control will improve Outcome: Progressing   Problem: Safety: Goal: Periods of time without injury will increase Outcome: Progressing

## 2024-06-11 NOTE — BH IP Treatment Plan (Signed)
 Interdisciplinary Treatment and Diagnostic Plan Update  06/11/2024 Time of Session: 9:05AM - UPDATE Iden Stripling MRN: [id number]  Principal Diagnosis: Bipolar 1 disorder, mixed, moderate (HCC)  Secondary Diagnoses: Principal Problem:   Bipolar 1 disorder, mixed, moderate (HCC) Active Problems:   Borderline personality disorder (HCC)   Current Medications:  Current Facility-Administered Medications  Medication Dose Route Frequency Provider Last Rate Last Admin   acetaminophen  (TYLENOL ) tablet 650 mg  650 mg Oral Q6H PRN Gottfried, [name], MD       alum & mag hydroxide-simeth (MAALOX/MYLANTA) 200-200-20 MG/5ML suspension 30 mL  30 mL Oral Q4H PRN Gottfried, [name], MD       cholecalciferol (VITAMIN D3) 10 MCG (400 UNIT) tablet 400 Units  400 Units Oral Daily Prentis Oliva LABOR, DO   400 Units at 06/11/24 0804   estradiol  (ESTRACE ) tablet 2 mg  2 mg Oral Daily Prentis Oliva A, DO   2 mg at 06/10/24 2108   estradiol  (ESTRACE ) tablet 4 mg  4 mg Oral Q24H Gottfried, [name], MD   4 mg at 06/10/24 2109   FLUoxetine  (PROZAC ) capsule 20 mg  20 mg Oral Daily Bennett, Christal H, NP   20 mg at 06/11/24 0802   hydrOXYzine  (ATARAX ) tablet 25 mg  25 mg Oral TID PRN Gottfried, [name], MD       magnesium  hydroxide (MILK OF MAGNESIA) suspension 30 mL  30 mL Oral Daily PRN Gottfried, [name], MD       OLANZapine  (ZYPREXA ) injection 10 mg  10 mg Intramuscular TID PRN Gottfried, [name], MD       OLANZapine  (ZYPREXA ) injection 5 mg  5 mg Intramuscular TID PRN Gottfried, [name], MD       OLANZapine  zydis (ZYPREXA ) disintegrating tablet 5 mg  5 mg Oral TID PRN Gottfried, [name], MD       progesterone  (PROMETRIUM ) capsule 200 mg  200 mg Oral QHS [name], [name], MD   200 mg at 06/10/24 2109   spironolactone  (ALDACTONE ) tablet 100 mg  100 mg Oral QHS Gottfried, [name], MD   100 mg at 06/10/24 2116   traZODone  (DESYREL ) tablet 50 mg  50 mg Oral QHS PRN Gottfried, [name], MD   50 mg at 06/07/24 2145    PTA Medications: Medications Prior to Admission  Medication Sig Dispense Refill Last Dose/Taking   emtricitabine -tenofovir  (TRUVADA ) 200-300 MG tablet Take 1 tablet by mouth daily. (Patient not taking: Reported on 12/06/2023)      estradiol  (ESTRACE ) 2 MG tablet Take 1.5 mg by mouth 2 (two) times daily. (Patient not taking: Reported on 12/06/2023)      FLUoxetine  (PROZAC ) 40 MG capsule Take 1 capsule (40 mg total) by mouth daily. (Patient not taking: Reported on 06/01/2024) 30 capsule 0    hydrOXYzine  (ATARAX ) 25 MG tablet Take 1 tablet (25 mg total) by mouth 3 (three) times daily. (Patient not taking: Reported on 06/01/2024) 30 tablet 0    progesterone  (PROMETRIUM ) 100 MG capsule Take 100 mg by mouth at bedtime. (Patient not taking: Reported on 12/06/2023)      progesterone  (PROMETRIUM ) 200 MG capsule Take 1 capsule (200 mg total) by mouth at bedtime.      spironolactone  (ALDACTONE ) 100 MG tablet Take 200 mg by mouth at bedtime. (Patient not taking: Reported on 12/06/2023)      spironolactone  (ALDACTONE ) 100 MG tablet Take 1 tablet (100 mg total) by mouth at bedtime.      traZODone  (DESYREL ) 50 MG  tablet Take 1 tablet (50 mg total) by mouth at bedtime as needed for sleep. (Patient not taking: Reported on 06/01/2024) 30 tablet 0     Patient Stressors: Financial difficulties   Occupational concerns    Patient Strengths: Motivation for treatment/growth  Supportive family/friends   Treatment Modalities: Medication Management, Group therapy, Case management,  1 to 1 session with clinician, Psychoeducation, Recreational therapy.   Physician Treatment Plan for Primary Diagnosis: Bipolar 1 disorder, mixed, moderate (HCC) Long Term Goal(s): Improvement in symptoms so as ready for discharge   Short Term Goals: Ability to identify changes in lifestyle to reduce recurrence of condition will improve Ability to verbalize feelings will improve Ability to disclose and discuss suicidal ideas Ability to  demonstrate self-control will improve Ability to identify and develop effective coping behaviors will improve Ability to maintain clinical measurements within normal limits will improve Compliance with prescribed medications will improve Ability to identify triggers associated with substance abuse/mental health issues will improve  Medication Management: Evaluate patient's response, side effects, and tolerance of medication regimen.  Therapeutic Interventions: 1 to 1 sessions, Unit Group sessions and Medication administration.  Evaluation of Outcomes: Progressing  Physician Treatment Plan for Secondary Diagnosis: Principal Problem:   Bipolar 1 disorder, mixed, moderate (HCC) Active Problems:   Borderline personality disorder (HCC)  Long Term Goal(s): Improvement in symptoms so as ready for discharge   Short Term Goals: Ability to identify changes in lifestyle to reduce recurrence of condition will improve Ability to verbalize feelings will improve Ability to disclose and discuss suicidal ideas Ability to demonstrate self-control will improve Ability to identify and develop effective coping behaviors will improve Ability to maintain clinical measurements within normal limits will improve Compliance with prescribed medications will improve Ability to identify triggers associated with substance abuse/mental health issues will improve     Medication Management: Evaluate patient's response, side effects, and tolerance of medication regimen.  Therapeutic Interventions: 1 to 1 sessions, Unit Group sessions and Medication administration.  Evaluation of Outcomes: Progressing   RN Treatment Plan for Primary Diagnosis: Bipolar 1 disorder, mixed, moderate (HCC) Long Term Goal(s): Knowledge of disease and therapeutic regimen to maintain health will improve  Short Term Goals: Ability to remain free from injury will improve, Ability to verbalize frustration and anger appropriately will  improve, Ability to demonstrate self-control, Ability to participate in decision making will improve, and Ability to verbalize feelings will improve  Medication Management: RN will administer medications as ordered by provider, will assess and evaluate patient's response and provide education to patient for prescribed medication. RN will report any adverse and/or side effects to prescribing provider.  Therapeutic Interventions: 1 on 1 counseling sessions, Psychoeducation, Medication administration, Evaluate responses to treatment, Monitor vital signs and CBGs as ordered, Perform/monitor CIWA, COWS, AIMS and Fall Risk screenings as ordered, Perform wound care treatments as ordered.  Evaluation of Outcomes: Progressing   LCSW Treatment Plan for Primary Diagnosis: Bipolar 1 disorder, mixed, moderate (HCC) Long Term Goal(s): Safe transition to appropriate next level of care at discharge, Engage patient in therapeutic group addressing interpersonal concerns.  Short Term Goals: Engage patient in aftercare planning with referrals and resources, Increase social support, Increase ability to appropriately verbalize feelings, Increase emotional regulation, and Facilitate acceptance of mental health diagnosis and concerns  Therapeutic Interventions: Assess for all discharge needs, 1 to 1 time with Social worker, Explore available resources and support systems, Assess for adequacy in community support network, Educate family and significant other(s) on suicide prevention, Complete Psychosocial  Assessment, Interpersonal group therapy.  Evaluation of Outcomes: Progressing   Progress in Treatment: Attending groups: Yes. Participating in groups: Yes. Taking medication as prescribed: Yes. Toleration medication: Yes. Family/Significant other contact made: No, will contact:  Kelsie Kramp (mother) [phone] Patient understands diagnosis: Yes. Discussing patient identified problems/goals with staff:  Yes. Medical problems stabilized or resolved: Yes. Denies suicidal/homicidal ideation: Yes. Issues/concerns per patient self-inventory: No.   New problem(s) identified: No, Describe:  none   New Short Term/Long Term Goal(s): medication stabilization, elimination of SI thoughts, development of comprehensive mental wellness plan.      Patient Goals:  Eliminate SI thoughts   Discharge Plan or Barriers: Patient recently admitted. CSW will continue to follow and assess for appropriate referrals and possible discharge planning.      Reason for Continuation of Hospitalization: Depression Medication stabilization Suicidal ideation   Estimated Length of Stay: 5-7 days  Last 3 [place] Suicide Severity Risk Score: Flowsheet Row Ad[facility] (Current) [facility] from 06/02/2024 in [facility] ED from 06/01/2024 in [facility] Emergency Department at [facility]  C-SSRS RISK CATEGORY Low Risk Low Risk No Risk    Last [facility] 2/9 Scores:    06/03/2024   11:39 AM  Depression screen PHQ 2/9  Decreased Interest 3  Down, Depressed, Hopeless 3  PHQ - 2 Score 6  Altered sleeping 3  Tired, decreased energy 2  Change in appetite 1  Feeling bad or failure about yourself  3  Trouble concentrating 1  Moving slowly or fidgety/restless 0  Suicidal thoughts 3  PHQ-9 Score 19  Difficult doing work/chores Very difficult    Scribe for Treatment Team: Lianny Molter M Daymen Hassebrock, LCSWA 06/11/2024 10:10 AM

## 2024-06-11 NOTE — Group Note (Signed)
 Date:  06/11/2024 Time:  11:09 AM  Group Topic/Focus:  Wellness    Participation Level:  Active  Additional Comments:  attended group  [employer] 06/11/2024, 11:09 AM

## 2024-06-11 NOTE — Group Note (Signed)
 Date:  06/11/2024 Time:  9:43 AM  Group Topic/Focus:  Goals Group:   The focus of this group is to help patients establish daily goals to achieve during treatment and discuss how the patient can incorporate goal setting into their daily lives to aide in recovery.    Participation Level:  Active  Participation Quality:  Appropriate  Affect:  Appropriate  Cognitive:  Alert  Insight: Appropriate  Engagement in Group:  Engaged  Modes of Intervention:  Orientation  Additional Comments:  [name] has attended group, she stills feels like she is able to help others. Although still in need to get help for herself while here.  Dolores HERO Yusuke Beza 06/11/2024, 9:43 AM

## 2024-06-11 NOTE — Progress Notes (Signed)
 [facility] Inpatient Psychiatry Progress Note  Date: 06/11/24 Patient: George [name] MRN: [id number]  Assessment and Plan: George [name]) is a 28 y.o. transgender male admitted for suicidal ideation in the context of multiple stressors, which include chronic homelessness, lack of financial resources, and recently starting probation. They have a psychiatric history that reportedly includes numerous psychiatric hospitalizations and diagnoses of DID, PTSD, ADHD, bipolar disorder, and borderline personality disorder. The patient's observed behaviors during this admission appear to suggest borderline personality disorder as the primary clinical issue.  10/19 - No clear changes. Patient likely appropriate for discharge to [facility] or another shelter after the weekend. They have been observed in the hospital for five days now and exhibit a clear discrepancy between their stated mood and intensity of suicidal ideation and observed affect and behavior. They have been energetic and bright with other patients and not withdrawn. They continue to cite homelessness and other psychosocial stressors as the driver for suicidal ideation, and they note a history of multiple psychiatric admissions and knowledge that expression of suicidal ideation can lead to a prolonged hospitalization. Given that the only practical intervention the hospital can accomplish to address lack of shelter is to send them to the [facility] or similar shelter there is unclear benefit from further hospitalization. The patient's presentation suggests that borderline character pathology plays a prominent role in their current symptoms. While some of these symptoms may improve with mood stabilizing agents the patient has expressed a preference to not take them, and medication is generally unlikely to resolve suicidal ideation. Also considering that there is likely a component of secondary gain discharge is recommended Monday or Tuesday  if no further interventions are identified  10/20 - Stable generally mood, affect and behavior.  Patient reports depression has resolved though does have some residual SI.  No method or plan. - Tolerating medication regimen well.   # Borderline Personality Disorder / Suicidal Ideation - Continue Fluoxetine  20 mg daily   Risk Assessment - Intermediate (recent expressed suicidal intent)  Discharge Planning Barriers to discharge: Suicidal ideation Estimated length of stay: 2-4 days Predicted Discharge location: Homelessness/IRC     Interval History and update:   The patient is attending groups and interacting well with patients and others.  The patient interacted well with me during the interview.  The patient had a euthymic and bright affect.  The patient reports no depressed mood and resolution of depression.  Denies HI and AVH.  Sleep and appetite were stable.  Tolerating medication regimen well with no observed reported side effects. The patient reports still having suicidal ideations though no method or plan. We discussed discharge to homeless shelter due to the patient not being able to find housing with family, friends or a local homeless shelter.     Physical Exam MSK/Neuro - Normal gait and station  Mental Status Exam Appearance - Casually dressed, appropriate hygiene and grooming Eye-contact - moderate Attitude - Aloof, mildly disinterested Speech - normal volume, prosody, inflection Mood - my depression is gone Affect - Full Thought Process - LLGD Thought Content - No delusional TC expressed SI/HI - passive thoughts as above Perceptions - Denies AVH; not RIS Judgement/Insight - Poor Fund of knowledge - WNL Language - No impairments      Lab Results:  Admission on 06/05/2024  Component Date Value Ref Range Status   Cholesterol 06/07/2024 195  0 - 200 mg/dL Final   Triglycerides 89/83/7974 109  <150 mg/dL Final   HDL 89/83/7974  62  >40 mg/dL Final   Total  CHOL/HDL Ratio 06/07/2024 3.2  RATIO Final   VLDL 06/07/2024 22  0 - 40 mg/dL Final   LDL Cholesterol 06/07/2024 111 (H)  0 - 99 mg/dL Final   Vit D, 74-Ybimnkb 06/07/2024 10.58 (L)  30 - 100 ng/mL Final   Hgb A1c MFr Bld 06/07/2024 4.3 (L)  4.8 - 5.6 % Final   Mean Plasma Glucose 06/07/2024 76.71  mg/dL Final     Vitals: Blood pressure 115/80, pulse 79, temperature 97.7 F (36.5 C), temperature source Oral, resp. rate 16, height 5' 7 (1.702 m), weight 58.5 kg, SpO2 99%.    Lamar Handler Jama Slain, DO

## 2024-06-11 NOTE — Group Note (Signed)
 Date:  06/11/2024 Time:  8:12 PM  Group Topic/Focus:  Wrap-Up Group:   The focus of this group is to help patients review their daily goal of treatment and discuss progress on daily workbooks.    Participation Level:  Active  Participation Quality:  Appropriate  Affect:  Appropriate  Cognitive:  Appropriate  Insight: Appropriate  Engagement in Group:  Engaged  Modes of Intervention:  Discussion  Additional Comments:   Pt attended AA group  Jontae Sonier A Loris Seelye 06/11/2024, 8:12 PM

## 2024-06-12 DIAGNOSIS — F603 Borderline personality disorder: Secondary | ICD-10-CM | POA: Diagnosis not present

## 2024-06-12 DIAGNOSIS — R45851 Suicidal ideations: Secondary | ICD-10-CM | POA: Diagnosis not present

## 2024-06-12 NOTE — Progress Notes (Signed)
 Tour of Duty:  Prentice JINNY Angle, RN, 06/12/24, Tour of Duty: 0700-1900  SI/HI/AVH: Lewanda GUTTA with plan and intent but did not disclose further. Denies HI/AVH  Self-Reported   Mood: Negative  Anxiety: Denies Depression: Endorses Irritability: Denies, but Observable  Broset  Violence Prevention Guidelines *See Row Information*: Small Violence Risk interventions implemented   LBM  Last BM Date : 06/11/24   Pain: not present  Patient Refusals (including Rx): No  >>Shift Summary: Patient observed to be calm on unit. Patient able to make needs known. Patient observed to engage appropriately with peers, but presents as depressive and irritable with staff. Patient taking medications as prescribed. This shift, no PRN medication requested or required. No reported or observed side effects to medication. No reported or observed agitation, aggression, or other acute emotional distress. No reported or observed physical abnormalities or concerns.  Last Vitals  Vitals Weight: 58.5 kg Temp: 97.6 F (36.4 C) Temp Source: Oral Pulse Rate: 92 Resp: 16 BP: 110/83 Patient Position: (not recorded)  Admission Type  Psych Admission Type (Psych Patients Only) Admission Status: Voluntary Date 72 hour document signed : (not recorded) Time 72 hour document signed : (not recorded) Provider Notified (First and Last Name) (see details for LINK to note): (not recorded)   Psychosocial Assessment  Psychosocial Assessment Patient Complaints: Depression, Self-harm thoughts Eye Contact: Brief Facial Expression: Sullen Affect: Flat Speech: Logical/coherent Interaction: Attention-seeking Motor Activity: Other (Comment) (WDL) Appearance/Hygiene: Unremarkable Behavior Characteristics: Cooperative Mood: Depressed   Aggressive Behavior  Targets: (not recorded)   Thought Process  Thought Process Coherency: Within Defined Limits Content: Within Defined Limits Delusions: None reported or  observed Perception: Within Defined Limits Hallucination: None reported or observed Judgment: Limited Confusion: None  Danger to Self/Others  Danger to Self Current suicidal ideation?: Active Description of Suicide Plan: Reports plan and intent but would not disclose. Self-Injurious Behavior: (not recorded) Agreement Not to Harm Self: (not recorded) Description of Agreement: (not recorded) Danger to Others: None reported or observed

## 2024-06-12 NOTE — Progress Notes (Signed)
 [facility] Inpatient Psychiatry Progress Note  Date: 06/12/24 Patient: George [name] MRN: [id number]  Assessment and Plan: [employer] [employer] [facility]) is a 28 y.o. transgender male admitted for suicidal ideation in the context of multiple stressors, which include chronic homelessness, lack of financial resources, and recently starting probation. They have a psychiatric history that reportedly includes numerous psychiatric hospitalizations and diagnoses of DID, PTSD, ADHD, bipolar disorder, and borderline personality disorder. The patient's observed behaviors during this admission appear to suggest borderline personality disorder as the primary clinical issue.  10/19 - No clear changes. Patient likely appropriate for discharge to [facility] or another shelter after the weekend. They have been observed in the hospital for five days now and exhibit a clear discrepancy between their stated mood and intensity of suicidal ideation and observed affect and behavior. They have been energetic and bright with other patients and not withdrawn. They continue to cite homelessness and other psychosocial stressors as the driver for suicidal ideation, and they note a history of multiple psychiatric admissions and knowledge that expression of suicidal ideation can lead to a prolonged hospitalization. Given that the only practical intervention the hospital can accomplish to address lack of shelter is to send them to the [facility] or similar shelter there is unclear benefit from further hospitalization. The patient's presentation suggests that borderline character pathology plays a prominent role in their current symptoms. While some of these symptoms may improve with mood stabilizing agents the patient has expressed a preference to not take them, and medication is generally unlikely to resolve suicidal ideation. Also considering that there is likely a component of secondary gain discharge is recommended Monday or Tuesday  if no further interventions are identified  10/20 - Stable generally mood, affect and behavior.  Patient reports depression has resolved though does have some residual SI.  No method or plan. - Tolerating medication regimen well.  10/21 -Continued stability in terms of mood, affect and behavior. The patient is interacting well with me and is actively participating in groups. The patient is presenting a euthymic and sometimes bright mood on the unit and informs me that their depression has mostly resolved. The patient does endorse intermittent and non-specific SI, though they state it is less intense now. SI appears to be chronic and non-specific. Furthermore the patient informs me that their SI is due to their legal problems which interferes with their ability to establish stable employment and housing. I discussed various work opportunities with the patient, though they repeat that they have tried everything. Given these factors of mood, behavioral and affective stability, they have met the therapeutic goals of an admission. Continued admission due to lack of housing or employment is not a meaningful criteria to prolong admission. Due to no other reliable housing options, the patient will be discharged to the [facility] where the patient can coordinate further social services and case management.     # Borderline Personality Disorder / Suicidal Ideation - Continue Fluoxetine  20 mg daily   Risk Assessment - Intermediate (recent expressed suicidal intent)  Discharge Planning Barriers to discharge: Suicidal ideation Estimated length of stay: D/C 10/22 Predicted Discharge location: Homelessness/IRC     Interval History and update:   The patient continues to attend groups reliably and per the notes is well engaged.  I have observed the patient on the unit, and he interacts well with others as well as myself, staff and peers.  During my interviews, the patient is friendly, cooperative and does not  demonstrate any  dysphoric mood nor agitated behavior. The patient informs me that they ate and slept well last night.  The patient also informs me that her depressed mood has resolved several days ago and is only experiencing intermittent passive suicidal ideations now.  The patient informs me that the suicidal ideations are mostly due to their difficulties finding employment and housing.  The patient states that if these were resolved, they would likely not have suicidal ideations. Discussed various housing options as well as possible employment options and the patient states I have tried them all, none of them will hire me or allow me to reside at their facility. The patient reports tolerating the Prozac  well and denies any side effects. Endorses passive SI, denies a method, plan or intent.  Denies HI or AVH.   Review of systems Negative for headache, chest pain, shortness of breath, abdominal pain, nausea, vomiting, diarrhea, constipation or dysuria     Physical Exam MSK/Neuro - Normal gait and station  Mental Status Exam Appearance - Casually dressed, appropriate hygiene and grooming Eye-contact - moderate Attitude - bright Speech - normal volume, prosody, inflection Mood - my depression is gone Affect - Full Thought Process - LLGD Thought Content - No delusions expressed SI/HI - passive thoughts as above Perceptions - Denies AVH; not RIS Judgement/Insight - Poor Fund of knowledge - WNL Language - No impairments      Lab Results:  Admission on 06/05/2024  Component Date Value Ref Range Status   Cholesterol 06/07/2024 195  0 - 200 mg/dL Final   Triglycerides 89/83/7974 109  <150 mg/dL Final   HDL 89/83/7974 62  >40 mg/dL Final   Total CHOL/HDL Ratio 06/07/2024 3.2  RATIO Final   VLDL 06/07/2024 22  0 - 40 mg/dL Final   LDL Cholesterol 06/07/2024 111 (H)  0 - 99 mg/dL Final   Vit D, 74-Ybimnkb 06/07/2024 10.58 (L)  30 - 100 ng/mL Final   Hgb A1c MFr Bld 06/07/2024 4.3  (L)  4.8 - 5.6 % Final   Mean Plasma Glucose 06/07/2024 76.71  mg/dL Final     Vitals: Blood pressure (!) 130/93, pulse 69, temperature 97.6 F (36.4 C), temperature source Oral, resp. rate 16, height 5' 7 (1.702 m), weight 58.5 kg, SpO2 100%.    Lamar Handler Jama Slain, DO

## 2024-06-12 NOTE — Group Note (Signed)
 Date:  06/12/2024 Time:  4:15 PM  Group Topic/Focus:  Developing a Wellness Toolbox:   The focus of this group is to help patients develop a wellness toolbox with skills and strategies to promote recovery upon discharge.    Participation Level:  Active  Participation Quality:  Appropriate  Affect:  Appropriate  Cognitive:  Appropriate  Insight: Appropriate  Engagement in Group:  Engaged  Modes of Intervention:  Discussion   Annalee  Adryan Druckenmiller 06/12/2024, 4:15 PM

## 2024-06-12 NOTE — Plan of Care (Signed)
   Problem: Education: Goal: Emotional status will improve Outcome: Progressing Goal: Mental status will improve Outcome: Progressing   Problem: Activity: Goal: Interest or engagement in activities will improve Outcome: Progressing Goal: Sleeping patterns will improve Outcome: Progressing

## 2024-06-12 NOTE — Plan of Care (Signed)
   Problem: Education: Goal: Emotional status will improve Outcome: Progressing Goal: Mental status will improve Outcome: Progressing   Problem: Activity: Goal: Interest or engagement in activities will improve Outcome: Progressing Goal: Sleeping patterns will improve Outcome: Progressing   Problem: Coping: Goal: Ability to verbalize frustrations and anger appropriately will improve Outcome: Progressing Goal: Ability to demonstrate self-control will improve Outcome: Progressing   Problem: Safety: Goal: Periods of time without injury will increase Outcome: Progressing

## 2024-06-12 NOTE — Progress Notes (Signed)
   06/12/24 2045  Psych Admission Type (Psych Patients Only)  Admission Status Voluntary  Psychosocial Assessment  Patient Complaints Depression;Self-harm thoughts  Eye Contact Fair  Facial Expression Animated  Affect Appropriate to circumstance  Speech Logical/coherent  Interaction Attention-seeking  Motor Activity Fidgety  Appearance/Hygiene Unremarkable  Behavior Characteristics Cooperative  Mood Pleasant  Aggressive Behavior  Effect No apparent injury  Thought Process  Coherency WDL  Content WDL  Delusions WDL  Perception WDL  Hallucination None reported or observed  Judgment WDL  Confusion WDL  Danger to Self  Current suicidal ideation? Passive  Agreement Not to Harm Self Yes  Description of Agreement Verbal contract for safety  Danger to Others  Danger to Others None reported or observed

## 2024-06-12 NOTE — Progress Notes (Signed)
(  Sleep Hours) -7.75   (Any PRNs that were needed, meds refused, or side effects to meds)- none  (Any disturbances and when (visitation, over night)- none  (Concerns raised by the patient)- none  (SI/HI/AVH)- SI-contracts for safety

## 2024-06-12 NOTE — Progress Notes (Signed)
 CSW requested patient's nurse to please allow patient access to his phone to communicate with a possible place to stay upon discharge.   SIGNED: Betheny Suchecki Nunez-Uva, LCSW-A  06/12/24 2:05 PM

## 2024-06-12 NOTE — Progress Notes (Signed)
(  Sleep Hours) -7.25  (Any PRNs that were needed, meds refused, or side effects to meds)- none  (Any disturbances and when (visitation, over night)- none  (Concerns raised by the patient)- none , Ask me 3 discussed with pt and verbal understanding stated.    (SI/HI/AVH)- SI

## 2024-06-12 NOTE — Group Note (Signed)
 Date:  06/12/2024 Time:  1:42 PM  Group Topic/Focus:  [facility] Group:   The purpose of this group is to discuss setting SMART goals and creating a WRAP plan.   Participation Level:  Active  Participation Quality:  Attentive and Monopolizing  Affect:  Appropriate  Cognitive:  Appropriate  Insight: Appropriate  Engagement in Group:  Engaged and Monopolizing  Modes of Intervention:  Discussion and Education  Additional Comments:    Cruz JONETTA Mars 06/12/2024, 1:42 PM

## 2024-06-12 NOTE — Group Note (Signed)
 Date:  06/12/2024 Time:  8:41 PM  Group Topic/Focus:  Wrap-Up Group:   The focus of this group is to help patients review their daily goal of treatment and discuss progress on daily workbooks.    Participation Level:  Active  Participation Quality:  Appropriate and Sharing  Affect:  Appropriate  Cognitive:  Appropriate  Insight: Appropriate  Engagement in Group:  Engaged and Off Topic  Modes of Intervention:  Activity and Socialization  Additional Comments:  Patients shared one high point and one low point from their day. Patient rated her day a 6 out of 10. Patient high point, Pet Therapy. Patient low point, the day dragging. Patient shared that she has not began planning for discharge due to needing resources.   Eward Mace 06/12/2024, 8:41 PM

## 2024-06-12 NOTE — Group Note (Signed)
 Date:  06/12/2024 Time:  11:25 AM  Group Topic/Focus:  Pet Therapy:   This group aims to reduce stress and anxiety, improve mood, and increase feelings of comfort using animals.   Participation Level:  Patient did attend group   George [name] 06/12/2024, 11:25 AM

## 2024-06-12 NOTE — Group Note (Signed)
 LCSW Group Therapy Note   Group Date: 06/12/2024 Start Time: 1300 End Time: 1350   Type of Therapy and Topic:  Group Therapy: Building Healthy Relationships  Participation Level:  Active  Description of Group: This group will address the use of boundaries in their personal lives. Patients will explore why boundaries are important, the difference between healthy and unhealthy boundaries, and negative and postive outcomes of different boundaries and will look at how boundaries can be crossed.  Patients will be encouraged to identify current boundaries in their own lives and identify what kind of boundary is being set. Facilitators will guide patients in utilizing problem-solving interventions to address and correct types boundaries being used and to address when no boundary is being used. Understanding and applying boundaries will be explored and addressed for obtaining and maintaining a balanced life. Patients will be encouraged to explore ways to assertively make their boundaries and needs known to significant others in their lives, using other group members and facilitator for role play, support, and feedback. Objective:  To explore loneliness, boundaries, and safe ways to build relationships. Participants discussed loneliness, healthy connections, and setting boundaries. They explored safe ways to meet people and shared personal experiences. Key insights were reinforced through discussion and quotes.   Goals: Recognize healthy vs. unhealthy relationships. Learn safe ways to connect with others. Strengthen communication and [employer].    Therapeutic Modalities Used: Cognitive Behavioral Therapy (CBT) Elements - Identifying unhealthy relationship patterns, challenging negative thoughts about connection. Dialectical Behavior Therapy (DBT) Elements - Interpersonal effectiveness, setting and maintaining boundaries. Supportive Group Therapy - Peer discussion, shared experiences, and  emotional validation.  Summary of Patient Progress:  [name] was appropriate and present/active throughout the session and proved open to feedback from CSW and peers. Patient demonstrated some insight into the subject matter, was respectful of peers, but at times in and out of sleep throughout the session. Patient did participate more towards end of the group.  Louetta Lame, LCSWA 06/12/2024  2:49 PM

## 2024-06-12 NOTE — Group Note (Signed)
 Date:  06/12/2024 Time:  9:19 AM  Group Topic/Focus:  Goals Group:   The focus of this group is to help patients establish daily goals to achieve during treatment and discuss how the patient can incorporate goal setting into their daily lives to aide in recovery. Orientation:   The focus of this group is to educate the patient on the purpose and policies of crisis stabilization and provide a format to answer questions about their admission.  The group details unit policies and expectations of patients while admitted.    Participation Level:  Active  Participation Quality:  Appropriate  Affect:  Appropriate  Cognitive:  Appropriate  Insight: Appropriate  Engagement in Group:  Engaged  Modes of Intervention:  Discussion and Orientation  Additional Comments:    George [name] D [name] 06/12/2024, 9:19 AM

## 2024-06-12 NOTE — Plan of Care (Signed)
   Problem: Education: Goal: Emotional status will improve Outcome: Not Progressing Goal: Mental status will improve Outcome: Not Progressing

## 2024-06-12 NOTE — Group Note (Signed)
 Recreation Therapy Group Note   Group Topic:Animal Assisted Therapy   Group Date: 06/12/2024 Start Time: 9052 End Time: 1030 Facilitators: Lindalou Soltis-McCall, LRT,CTRS Location: [address] Dayroom   Animal-Assisted Activity (AAA) Program Checklist/Progress Notes Patient Eligibility Criteria Checklist & Daily Group note for Rec Tx Intervention  AAA/T Program Assumption of Risk Form signed by Patient/ or Parent Legal Guardian Yes  Patient is free of allergies or severe asthma Yes  Patient reports no fear of animals Yes  Patient reports no history of cruelty to animals Yes  Patient understands his/her participation is voluntary Yes  Patient washes hands before animal contact Yes  Patient washes hands after animal contact Yes  Behavioral Response: Engaged   Education: Charity fundraiser, Appropriate Animal Interaction   Education Outcome: Acknowledges education.   Clinical Observations/Feedback: Patient attended session and interacted appropriately with therapy dog and peers. Patient asked appropriate questions about therapy dog and his training. Patient shared stories about their pets at home with group.   George [name], George [name]    Affect/Mood: Appropriate   Participation Level: Engaged   Participation Quality: Independent   Behavior: Hyperverbal   Speech/Thought Process: Focused   Insight: Good   Judgement: Good   Modes of Intervention: Teaching laboratory technician   Patient Response to Interventions:  Engaged   Education Outcome:  In group clarification offered    Clinical Observations/Individualized Feedback: Patient attended session and interacted appropriately with therapy dog and peers. Patient asked appropriate questions about therapy dog and his training. Patient shared stories about their pets at home with group. Patient also had a response to everything volunteer made.     Plan: Continue to engage patient in RT group sessions 2-3x/week.   Orel Cooler-McCall, LRT,CTRS 06/12/2024 12:15 PM

## 2024-06-13 DIAGNOSIS — R45851 Suicidal ideations: Secondary | ICD-10-CM | POA: Diagnosis not present

## 2024-06-13 DIAGNOSIS — F32A Depression, unspecified: Secondary | ICD-10-CM

## 2024-06-13 DIAGNOSIS — F603 Borderline personality disorder: Secondary | ICD-10-CM | POA: Diagnosis not present

## 2024-06-13 DIAGNOSIS — Z59819 Housing instability, housed unspecified: Secondary | ICD-10-CM | POA: Diagnosis not present

## 2024-06-13 MED ORDER — ESTRADIOL 2 MG PO TABS: 4.0000 mg | ORAL_TABLET | ORAL | 0 refills | Status: AC

## 2024-06-13 MED ORDER — FLUOXETINE HCL 20 MG PO CAPS
20.0000 mg | ORAL_CAPSULE | Freq: Every day | ORAL | 0 refills | Status: AC
Start: 2024-06-13 — End: ?

## 2024-06-13 MED ORDER — HYDROXYZINE HCL 25 MG PO TABS: 25.0000 mg | ORAL_TABLET | Freq: Three times a day (TID) | ORAL | 0 refills | Status: AC | PRN

## 2024-06-13 MED ORDER — CHOLECALCIFEROL 10 MCG (400 UNIT) PO TABS: 400.0000 [IU] | ORAL_TABLET | Freq: Every day | ORAL | 0 refills | Status: AC

## 2024-06-13 MED ORDER — ESTRADIOL 2 MG PO TABS: 2.0000 mg | ORAL_TABLET | Freq: Every day | ORAL | 0 refills | Status: AC

## 2024-06-13 MED ORDER — PROGESTERONE 200 MG PO CAPS: 200.0000 mg | ORAL_CAPSULE | Freq: Every day | ORAL | 0 refills | Status: AC

## 2024-06-13 NOTE — [facility] Suicide Risk Assessment (Signed)
 [facility] Discharge Suicide Risk Assessment   Principal Problem: Depression, unspecified Discharge Diagnoses: Principal Problem:   Depression, unspecified Active Problems:   Suicidal ideation   Borderline personality disorder (HCC)   Housing instability   Total Time spent with patient: 30 minutes  Musculoskeletal: Strength & Muscle Tone: within normal limits Gait & Station: normal Patient leans: N/A  Psychiatric Specialty Exam  Presentation  General Appearance:  Appropriate for Environment; Fairly Groomed  Eye Contact: Fair  Speech: Normal Rate  Speech Volume: Normal  Handedness: Right   Mood and Affect  Mood: Euthymic  Duration of Depression Symptoms: Greater than two weeks  Affect: Congruent   Thought Process  Thought Processes: Coherent; Goal Directed  Descriptions of Associations:Intact  Orientation:Full (Time, Place and Person)  Thought Content:Logical  History of Schizophrenia/Schizoaffective disorder:No  Duration of Psychotic Symptoms:No data recorded Hallucinations:No data recorded Ideas of Reference:None  Suicidal Thoughts:No data recorded Homicidal Thoughts:No data recorded  Sensorium  Memory: Immediate Good; Recent Good; Remote Good  Judgment: Fair  Insight: Poor; Fair   Art therapist  Concentration: Good  Attention Span: Good  Recall: [place] of Knowledge: Good  Language: Good   Psychomotor Activity  Psychomotor Activity:No data recorded  Assets  Assets: Communication Skills; Resilience   Sleep  Sleep:No data recorded Estimated Sleeping Duration (Last 24 Hours): 6.50-7.00 hours  Physical Exam: Physical Exam ROS Blood pressure (!) 115/91, pulse 72, temperature 98.4 F (36.9 C), temperature source Oral, resp. rate 14, height 5' 7 (1.702 m), weight 58.5 kg, SpO2 100%. Body mass index is 20.2 kg/m.  Mental Status Per Nursing Assessment::   On Admission:  Suicidal ideation indicated by  patient  Demographic Factors:  Caucasian, Gay, lesbian, or bisexual orientation, Low socioeconomic status, Living alone, and Unemployed  Loss Factors: Decrease in vocational status, Legal issues, and Financial problems/change in socioeconomic status  Historical Factors: Prior suicide attempts  Risk Reduction Factors:   Positive coping skills or problem solving skills  Continued Clinical Symptoms Personality Disorders:   Cluster B More than one psychiatric diagnosis Unstable or Poor Therapeutic Relationship Previous Psychiatric Diagnoses and Treatments  Cognitive Features That Contribute To Risk:  None    Suicide Risk:  Mild:  Suicidal ideation of limited frequency, intensity, duration, and specificity.  There are no identifiable plans, no associated intent, mild dysphoria and related symptoms, good self-control (both objective and subjective assessment), few other risk factors, and identifiable protective factors, including available and accessible social support.   Follow-up Information     [facility]. Go on 06/15/2024.   Why: You have a hospital follow up appointment on 06/15/24 at 11:00 am.  The appointment will be held in person.  Following this appointment, you will be scheduled for necessary therapy and medication management services. Contact information: [address] [place] [address] [phone]                 Plan Of Care/Follow-up recommendations:   Discharged to [facility] and follow-up with [facility]  Lamar Sherlean Jama Chandra, DO 06/13/2024, 7:21 AM

## 2024-06-13 NOTE — Group Note (Signed)
 Date:  06/13/2024 Time:  10:34 AM  Group Topic/Focus: Recreation Therapy   Pt did attend recreation therapy group.   George [name] R [name] 06/13/2024, 10:34 AM

## 2024-06-13 NOTE — Progress Notes (Signed)
 Patient discharged off unit at 1030. Patient belongings reviewed and acknowledged by patient. AVS and Transition Record reviewed and acknowledged by patient. Safety plan completed by patient, reviewed by nurse with patient and copy provided. Any medications and or prescriptions necessary for discharge addressed and provided to patient. Patient denies SI, plan or intent. Denies HI. Denies AVH. No observed or reported side effects to medication. No observed or reported agitation, aggression, or other acute emotional distress. No reported or observed physical abnormalities or concerns. Patient transportation from facility verified and observed.

## 2024-06-13 NOTE — Transportation (Signed)
 06/13/2024  George [name] DOB: [birth year] MRN: [id number]   RIDER WAIVER AND RELEASE OF LIABILITY  For the purposes of helping with transportation needs, [facility] partners with outside transportation providers (taxi companies, [place], Catering manager.) to give [facility] patients or other approved people the choice of on-demand rides Public librarian) to our buildings for non-emergency visits.  By using [employer], I, the person signing this document, on behalf of myself and/or any legal minors (in my care using the [employer]), agree:  Science writer given to me are supplied by independent, outside transportation providers who do not work for, or have any affiliation with, [employer]. [place] is not a transportation company. [place] has no control over the quality or safety of the rides I get using [employer]. [place] has no control over whether any outside ride will happen on time or not. [facility] gives no guarantee on the reliability, quality, safety, or availability on any rides, or that no mistakes will happen. I know and accept that traveling by vehicle (car, truck, SVU, fleeta, bus, taxi, etc.) has risks of serious injuries such as disability, being paralyzed, and death. I know and agree the risk of using [employer] is mine alone, and not [employer]. [employer] are provided as is and as are available. The transportation providers are in charge for all inspections and care of the vehicles used to provide these rides. I agree not to take legal action against [facility], its agents, employees, officers, directors, representatives, insurers, attorneys, assigns, successors, subsidiaries, and affiliates at any time for any reasons related directly or indirectly to using [employer]. I also agree not to take legal action against  or its affiliates for any injury, death, or damage to property caused by or related to using  [employer]. I have read this Waiver and Release of Liability, and I understand the terms used in it and their legal meaning. This Waiver is freely and voluntarily given with the understanding that my right (or any legal minors) to legal action against  relating to [employer] is knowingly given up to use these services.   I attest that I read the Ride Waiver and Release of Liability to George [name], gave George [name] the opportunity to ask questions and answered the questions asked (if any). I affirm that George [name] then provided consent for assistance with transportation.
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
## 2024-06-13 NOTE — Group Note (Signed)
 Date:  06/13/2024 Time:  9:20 AM  Group Topic/Focus:  Goals Group:   The focus of this group is to help patients establish daily goals to achieve during treatment and discuss how the patient can incorporate goal setting into their daily lives to aide in recovery. Orientation:   The focus of this group is to educate the patient on the purpose and policies of crisis stabilization and provide a format to answer questions about their admission.  The group details unit policies and expectations of patients while admitted.  To create a supportive and motivating environment where members can clarify and set meaningful personal and professional goals, understand how fulfilling foundational needs (as outlined in Maslow's hierarchy) supports their growth, utilize positive affirmations to build self-confidence and resilience, and foster continuous personal development and self-actualization.  Participation Level:  Active  Participation Quality:  Inattentive  Affect:  Appropriate  Cognitive:  Appropriate  Insight: Good  Engagement in Group:  Distracting and Engaged  Modes of Intervention:  Activity and Discussion  Additional Comments:    George [name] R [name] 06/13/2024, 9:20 AM

## 2024-06-13 NOTE — Progress Notes (Addendum)
  [facility] Adult Case Management Discharge Plan :  Will you be returning to the same living situation after discharge:  No. Patient will be discharging to her mother's house, [address] [name], [place]. At discharge, do you have transportation home?: No. CSW arranged taxi voucher through [employer] at 10:30 am. Do you have the ability to pay for your medications: Yes,  patient has active health insurance.  Release of information consent forms completed and in the chart;  Patient's signature needed at discharge.  Patient to Follow up at:  Follow-up Information     [facility]. Go on 06/15/2024.   Why: You have a hospital follow up appointment on 06/15/24 at 11:00 am.  The appointment will be held in person.  Following this appointment, you will be scheduled for necessary therapy and medication management services. Contact information: [address] [place] [address] [phone]                 Next level of care provider has access to [facility] Link:no  Safety Planning and Suicide Prevention discussed: Yes,  completed with George [name] (mother) [phone].     Has patient been referred to the Quitline?: Patient does not use tobacco/nicotine products  Patient has been referred for addiction treatment: No known substance use disorder.  George [name], LCSWA 06/13/2024, 8:54 AM
